# Patient Record
Sex: Female | Born: 1950 | Race: Black or African American | Hispanic: No | Marital: Married | Smoking: Never smoker
Health system: Southern US, Community
[De-identification: ages and names within clinical notes are randomized; demographics above are authoritative.]

## PROBLEM LIST (undated history)

## (undated) ENCOUNTER — Ambulatory Visit: Admission: EM | Payer: Medicare HMO

## (undated) DIAGNOSIS — I10 Essential (primary) hypertension: Secondary | ICD-10-CM

## (undated) DIAGNOSIS — E119 Type 2 diabetes mellitus without complications: Secondary | ICD-10-CM

## (undated) DIAGNOSIS — E7800 Pure hypercholesterolemia, unspecified: Secondary | ICD-10-CM

## (undated) DIAGNOSIS — D709 Neutropenia, unspecified: Secondary | ICD-10-CM

## (undated) HISTORY — DX: Essential (primary) hypertension: I10

## (undated) HISTORY — DX: Neutropenia, unspecified: D70.9

## (undated) HISTORY — DX: Type 2 diabetes mellitus without complications: E11.9

## (undated) HISTORY — PX: REDUCTION MAMMAPLASTY: SUR839

## (undated) HISTORY — PX: OTHER SURGICAL HISTORY: SHX169

## (undated) HISTORY — DX: Pure hypercholesterolemia, unspecified: E78.00

## (undated) NOTE — *Deleted (*Deleted)
[facility]  [address], [address] [place], [place] [address] Phone: [phone]  Fax: [phone]   Clinic Day:  11/23/2019  Referring physician: Rayetta Humphrey, MD  Chief Complaint: Darlene Chen is a 89 y.o. female with constitutional leukopenia and pancreatic cystic lesions who is seen for 3 week assessment and review of interval abdominal MRI.  HPI: The patient was last seen in the hematology clinic on 11/06/2019.  At that time, she was doing well.  She denied any B symptoms.  She was having issues with her knees and was planning bilateral knee replacement.  Exam revealed no adenopathy or hepatosplenomegaly. Hematocrit was 39.5, hemoglobin 12.9, platelets 311,000, WBC 2,700 (ANC 1,300). CA19-9 was 68 (0-35). Folate was 8.5. Vitamin B12 was 2,538.  We discussed a [facility] Hematology second opinion (Dr. Porfirio Oar).  Peripheral smear revealed absolute leukopenia, with ANC of 1300. Mature neutrophils showed appropriate nuclear lobation and cytoplasmic granularity. There was no increase in circulating blasts. RBC parameters and morphology were normal. Platelet count and morphology were normal.  Abdominal MRI with an without contrast on 11/17/2019 revealed a stable septated 1.7 cm cystic lesion in the pancreatic body communicating with the nondilated main pancreatic duct, most c/w a side-branch IPMN. There were no high risk MRI features. Annual follow-up MRI abdomen without and with IV contrast was recommended until 5 years stability demonstrated. There was no acute abnormality. There was a tiny anterior gallbladder wall polyp, not clearly seen on prior MRI, requiring no follow-up. There was no cholelithiasis. There was diffuse colonic diverticulosis.  During the interim, ***   Past Medical History:  Diagnosis Date  . Diabetes mellitus without complication (HCC)   . High cholesterol   . Hypertension   . Neutropenia [facility])     Past Surgical History:   Procedure Laterality Date  . arthroscopic knee surgery  on left 2000    . BREAST BIOPSY Right 2019   bx/clip-neg  . LEFT HEART CATH AND CORONARY ANGIOGRAPHY N/A 02/09/2017   Procedure: LEFT HEART CATH AND CORONARY ANGIOGRAPHY;  Surgeon: Alwyn Pea, MD;  Location: [facility] INVASIVE CV LAB;  Service: Cardiovascular;  Laterality: N/A;  . REDUCTION MAMMAPLASTY Bilateral    1990  . TUBAL LIGATION  1985  . UMBILICAL HERNIA REPAIR  2007    Family History  Problem Relation Age of Onset  . Breast cancer Sister 67    Social History:  reports that she has never smoked. She has never used smokeless tobacco. She reports that she does not drink alcohol and does not use drugs. does not drink alcohol or use drugs.She denies any alcohol or tobacco. She denies any know exposure to radiation or toxins. She is not currently working.  She lives in [place]. The patient is alone ***today.   Allergies:  Allergies  Allergen Reactions  . Colesevelam Nausea Only  . Biaxin [Clarithromycin] Hives  . Niacin Other (See Comments)    Burning from inside   . Statins Other (See Comments)    Muscle/joint pain.  . Sulfa Antibiotics Hives  . Sulfur Dioxide   . Tramadol Other (See Comments)    Unsure of exact reaction type  . Ezetimibe Other (See Comments)    Muscle/joint pain    Current Medications: Current Outpatient Medications  Medication Sig Dispense Refill  . amLODipine (NORVASC) 10 MG tablet Take 10 mg by mouth daily.    Marland Kitchen aspirin EC 81 MG tablet Take 81 mg by mouth daily.    . cycloSPORINE (  RESTASIS) 0.05 % ophthalmic emulsion Place 1 drop into both eyes 2 (two) times daily as needed.     . diclofenac sodium (VOLTAREN) 1 % GEL Apply 1 application topically as needed.    . fluocinonide cream (LIDEX) 0.05 % Apply 1 application topically 2 (two) times daily as needed. For eczema  0  . hydrochlorothiazide (HYDRODIURIL) 12.5 MG tablet Take 12.5 mg by mouth daily.    Marland Kitchen LORazepam (ATIVAN) 0.5 MG tablet  Take 1 tab 15-20 minutes prior to procedure. (Patient not taking: Reported on 11/06/2019)    . Melatonin (MELATONIN MAXIMUM STRENGTH) 5 MG TABS Take 2 tab qhs    . metFORMIN (GLUCOPHAGE) 500 MG tablet Take 500 mg by mouth 2 (two) times daily.    . metoprolol succinate (TOPROL-XL) 25 MG 24 hr tablet Take 25 mg by mouth daily.     No current facility-administered medications for this visit.    Review of Systems  Constitutional: Positive for diaphoresis (occasional). Negative for chills, fever, malaise/fatigue and weight loss (up 8 lbs).       Feels well.  HENT: Negative.  Negative for congestion, ear discharge, ear pain, hearing loss, nosebleeds, sinus pain, sore throat and tinnitus.   Eyes: Negative.  Negative for blurred vision, double vision and photophobia.  Respiratory: Negative.  Negative for cough, hemoptysis, sputum production and shortness of breath.   Cardiovascular: Negative.  Negative for chest pain, palpitations and leg swelling.  Gastrointestinal: Negative for abdominal pain, blood in stool, constipation, diarrhea, heartburn, melena, nausea and vomiting.       Normal bowels.  Genitourinary: Negative.  Negative for dysuria, flank pain, frequency, hematuria and urgency.  Musculoskeletal: Negative for back pain, falls, joint pain, myalgias and neck pain.  Skin: Negative.  Negative for itching and rash.  Neurological: Positive for headaches (rare). Negative for dizziness, tingling, speech change, focal weakness, seizures and weakness.  Endo/Heme/Allergies: Negative.  Negative for environmental allergies. Does not bruise/bleed easily.  Psychiatric/Behavioral: Negative.  Negative for depression and memory loss. The patient is not nervous/anxious and does not have insomnia.   All other systems reviewed and are negative.  Performance status (ECOG):  0***  Vitals There were no vitals taken for this visit.   Physical Exam Vitals and nursing note reviewed.  Constitutional:       General: She is not in acute distress.    Appearance: She is well-developed. She is not diaphoretic.  HENT:     Head: Normocephalic and atraumatic.     Comments: Short styled hair.    Mouth/Throat:     Pharynx: No oropharyngeal exudate.  Eyes:     General: No scleral icterus.    Conjunctiva/sclera: Conjunctivae normal.     Pupils: Pupils are equal, round, and reactive to light.     Comments: Brown eyes.  Neck:     Vascular: No JVD.  Cardiovascular:     Rate and Rhythm: Normal rate and regular rhythm.     Heart sounds: Normal heart sounds. No murmur heard.  No friction rub. No gallop.   Pulmonary:     Effort: Pulmonary effort is normal. No respiratory distress.     Breath sounds: Normal breath sounds. No wheezing or rales.  Chest:     Chest wall: No tenderness.  Abdominal:     General: Bowel sounds are normal. There is no distension.     Palpations: Abdomen is soft. There is no mass.     Tenderness: There is no abdominal tenderness. There is no  guarding or rebound.  Musculoskeletal:        General: No tenderness. Normal range of motion.     Cervical back: Normal range of motion and neck supple.  Lymphadenopathy:     Head:     Right side of head: No preauricular, posterior auricular or occipital adenopathy.     Left side of head: No preauricular, posterior auricular or occipital adenopathy.     Cervical: No cervical adenopathy.     Upper Body:     Right upper body: No supraclavicular adenopathy.     Left upper body: No supraclavicular adenopathy.  Skin:    General: Skin is warm and dry.     Coloration: Skin is not pale.     Findings: No erythema or rash.  Neurological:     Mental Status: She is alert and oriented to person, place, and time.  Psychiatric:        Behavior: Behavior normal.        Thought Content: Thought content normal.        Judgment: Judgment normal.    No visits with results within 3 Day(s) from this visit.  Latest known visit with results is:   Appointment on 11/06/2019  Component Date Value Ref Range Status  . Path Review 11/06/2019 Blood smear is reviewed.   Corrected   Comment: Absolute leukopenia, with ANC of 1300 per microliter. Mature neutrophils show appropriate nuclear lobation and cytoplasmic granularity. No increase in circulating blasts. Normal RBC parameters and morphology. Normal platelet count and morphology. The cause for the patient's leukopenia is unclear from morphologic review. Potential secondary causes of cytopenias should be considered, including nutritional deficiencies, medication/toxin effect, viral illness, or autoimmune/rheumatologic disease. If there is concern for an underlying marrow disorder, bone marrow biopsy and/or flow cytometry may provide a more comprehensive assessment. Clinical correlation is recommended. Reviewed by Beryle Quant, M.D. Performed at [facility], [address] [place], [place] [address] CORRECTED ON 11/01 AT 1517: PREVIOUSLY REPORTED AS Cytospin of peritoneal fluid is reviewed. No malignancy identified. Reactive mesothelial cells and chronic inflammatory cells. Less than 2 neutrophil                          s per microliter. Reviewed by Beryle Quant, M.D.   . WBC 11/06/2019 2.7* 4.0 - 10.5 K/uL Final  . RBC 11/06/2019 4.14  3.87 - 5.11 MIL/uL Final  . Hemoglobin 11/06/2019 12.9  12.0 - 15.0 g/dL Final  . HCT 19/14/7829 39.5  36 - 46 % Final  . MCV 11/06/2019 95.4  80.0 - 100.0 fL Final  . MCH 11/06/2019 31.2  26.0 - 34.0 pg Final  . MCHC 11/06/2019 32.7  30.0 - 36.0 g/dL Final  . RDW 56/21/3086 14.4  11.5 - 15.5 % Final  . Platelets 11/06/2019 311  150 - 400 K/uL Final  . nRBC 11/06/2019 0.0  0.0 - 0.2 % Final  . Neutrophils Relative % 11/06/2019 49  % Final  . Neutro Abs 11/06/2019 1.3* 1.7 - 7.7 K/uL Final  . Lymphocytes Relative 11/06/2019 42  % Final  . Lymphs Abs 11/06/2019 1.2  0.7 - 4.0 K/uL Final  . Monocytes Relative 11/06/2019 8  % Final  .  Monocytes Absolute 11/06/2019 0.2  0.1 - 1.0 K/uL Final  . Eosinophils Relative 11/06/2019 1  % Final  . Eosinophils Absolute 11/06/2019 0.0  0.0 - 0.5 K/uL Final  . Basophils Relative 11/06/2019 0  %  Final  . Basophils Absolute 11/06/2019 0.0  0.0 - 0.1 K/uL Final  . Immature Granulocytes 11/06/2019 0  % Final  . Abs Immature Granulocytes 11/06/2019 0.00  0.00 - 0.07 K/uL Final   Performed at [facility], [address], [place], [place] [address]    Assessment:  Loeta Herst is a 62 y.o. female with benign ethnic leukopenia/constitutional neutropenia. WBC has ranged between 2800 - 4400 since at least 03/12/2014 (no prior CBCs available).  Work-up on 06/28/2017 revealed a WBC 4,200, ANC 2,300, hemoglobin 13.7, hematocrit 40.9, platelets 382,000. B12 was 257 (low).  Normal studies included:  folate (12.8), HIV testing, hepatitis C antibody, hepatitis B core antibody total.  Peripheral smear was unremarkable.  Normal labs on 11/10/2018: folate (10.2), copper (122), and ANA.  She also has a history of pancreatic lesion initially seen on a abdomen CT in 05/2016.  MRCP MRI on 11/30/2017 revealed a septated cyst on the body of the pancreas, 1.7cm in maximum dimension. Additionally, there were liver and right kidney cysts, with mild hepatic steatosis. Abdominal MRI on 07/04/2018 revealed stable small cystic lesions in the pancreas.  MRCP MRI on 11/14/2018 revealed stable small pancreatic cystic lesions, largest in the pancreatic body measuring 1.7 cm.  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 Recommendation included continued follow-up by MRI in 6 months.  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  Continued annual follow-up was suggested.    Abdominal MRI with an without contrast on 11/17/2019 revealed a  stable septated 1.7 cm cystic lesion in the pancreatic body communicating with the nondilated main pancreatic duct, most c/w a side-branch IPMN. There were no high risk MRI features. Annual follow-up MRI abdomen without and with IV contrast was recommended until 5 years stability demonstrated. There was no acute abnormality. There was a tiny anterior gallbladder wall polyp, not clearly seen on prior MRI, requiring no follow-up. There was no cholelithiasis. There was diffuse colonic diverticulosis.  CA 19-9 has been followed: 52 on 05/16/2019, 47 on 07/05/2019, and 68 on 11/06/2019.  She has B12 deficiency.  B12 was 257 on 06/28/2017, 549 on 11/10/2018, 1763 on 07/05/2019, and 2538 on 11/06/2019.  She is on oral B12.  She has been off her oral B12.  Folate was 8.5 on 11/06/2019.  Symptomatically, ***  Plan: 1.   Review labs   2.   Mild leukopenia             CBCs prior to 2016 are unavailable.  WBC has ranged between 2800 - 4400 since at least 03/12/2014   WBC 2700 with an ANC of 1300 today.  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                         No increased risk of infections.                         Set point for circulating neutrophils lower.  Associated with Duffy null RBC type.                         No  treatment required.             Normal labs included: folate, copper and ANA.              Peripheral smear in 2019 revealed no abnormality.             No medications have been implicated.             She has had no infections, gingivitis, or B symptoms.  Exam reveals no adenopathy or hepatosplenomegaly. 3.   B12 deficiency             B12 was 257 on 06/28/2017 and 549 on 11/10/2018.   Recently B12 was 1763 on 07/05/2019.    B12 goal is 400.             Recheck B12 and adjust supplementation.  Folate is 8.5 today. 4.   Pancreatic lesion             Abdomen MRI on 05/22/2019 revealed  scattered pancreatic cysts.    There was a dominant 14 mm cystic lesion in the pancreatic tail (pseudocyst or side branch IPMN).   Continued annual follow-up was suggested.  Patient notes abdominal pain 2 weeks ago.  Patient requests f/u imaging.  Review prior recommendations.  Schedule repeat imaging prior to 12/2019. 5.   Planned bilateral knee surgery  She is scheduled for total knee replacement on 12/19/2019 in [place].  She notes no prior surgery with issues of infection (or low counts).  Discuss additional testing (marrow/flow cytometry) or second opinion prior to surgery.  Patient agrees to second opinion at [facility]. 6.   Abdomen MRI f/u pancreatic cyst. 7.   [facility]ematology second opinion (Dr. Porfirio Oar). 8.   RTC after abdomen MRI for MD assessment and review of imaging studies.  I discussed the assessment and treatment plan with the patient.  The patient was provided an opportunity to ask questions and all were answered.  The patient agreed with the plan and demonstrated an understanding of the instructions.  The patient was advised to call back if the symptoms worsen or if the condition fails to improve as anticipated.  I provided *** minutes of face-to-face time during this this encounter and > 50% was spent counseling as documented under my assessment and plan.   Rosey Bath, MD, PhD    11/23/2019, 4:29 PM  I, Jerilee Field, am acting as a Neurosurgeon for Rosey Bath, MD.  I, Areatha Kalata C. Merlene Pulling, MD, have reviewed the above documentation for accuracy and completeness, and I agree with the above.

---

## 1983-01-06 HISTORY — PX: TUBAL LIGATION: SHX77

## 2005-01-05 HISTORY — PX: UMBILICAL HERNIA REPAIR: SHX196

## 2008-09-02 ENCOUNTER — Emergency Department (HOSPITAL_COMMUNITY): Admission: EM | Admit: 2008-09-02 | Discharge: 2008-09-02 | Payer: Self-pay | Admitting: Emergency Medicine

## 2013-03-01 ENCOUNTER — Ambulatory Visit: Payer: Self-pay | Admitting: Physician Assistant

## 2013-06-02 DIAGNOSIS — M1712 Unilateral primary osteoarthritis, left knee: Secondary | ICD-10-CM | POA: Insufficient documentation

## 2013-09-26 DIAGNOSIS — E7800 Pure hypercholesterolemia, unspecified: Secondary | ICD-10-CM | POA: Insufficient documentation

## 2013-10-20 ENCOUNTER — Ambulatory Visit: Payer: Self-pay | Admitting: Family Medicine

## 2014-02-06 ENCOUNTER — Ambulatory Visit: Payer: Self-pay | Admitting: Family Medicine

## 2014-02-15 DIAGNOSIS — R7303 Prediabetes: Secondary | ICD-10-CM | POA: Insufficient documentation

## 2014-03-06 ENCOUNTER — Ambulatory Visit
Admit: 2014-03-06 | Disposition: A | Discharge status: Home / Self Care | Payer: Self-pay | Attending: Family Medicine | Admitting: Family Medicine

## 2014-04-06 ENCOUNTER — Ambulatory Visit
Admit: 2014-04-06 | Disposition: A | Discharge status: Home / Self Care | Payer: Self-pay | Attending: Family Medicine | Admitting: Family Medicine

## 2014-04-11 DIAGNOSIS — M940 Chondrocostal junction syndrome [Tietze]: Secondary | ICD-10-CM | POA: Insufficient documentation

## 2014-04-11 DIAGNOSIS — M8580 Other specified disorders of bone density and structure, unspecified site: Secondary | ICD-10-CM | POA: Insufficient documentation

## 2014-08-16 ENCOUNTER — Other Ambulatory Visit: Payer: Self-pay | Admitting: Family Medicine

## 2014-08-16 DIAGNOSIS — R928 Other abnormal and inconclusive findings on diagnostic imaging of breast: Secondary | ICD-10-CM

## 2014-08-24 ENCOUNTER — Ambulatory Visit
Admission: RE | Admit: 2014-08-24 | Discharge: 2014-08-24 | Disposition: A | Discharge status: Home / Self Care | Payer: Medicare Other | Source: Ambulatory Visit | Attending: Family Medicine | Admitting: Family Medicine

## 2014-08-24 ENCOUNTER — Other Ambulatory Visit: Payer: Self-pay | Admitting: Family Medicine

## 2014-08-24 DIAGNOSIS — R928 Other abnormal and inconclusive findings on diagnostic imaging of breast: Secondary | ICD-10-CM

## 2014-08-24 DIAGNOSIS — N6001 Solitary cyst of right breast: Secondary | ICD-10-CM | POA: Diagnosis not present

## 2014-11-15 DIAGNOSIS — M791 Myalgia, unspecified site: Secondary | ICD-10-CM | POA: Insufficient documentation

## 2014-11-23 ENCOUNTER — Encounter: Payer: Self-pay | Admitting: *Deleted

## 2015-04-08 DIAGNOSIS — Z6841 Body Mass Index (BMI) 40.0 and over, adult: Secondary | ICD-10-CM | POA: Insufficient documentation

## 2015-06-20 ENCOUNTER — Other Ambulatory Visit: Payer: Self-pay | Admitting: Student

## 2015-06-20 DIAGNOSIS — M7581 Other shoulder lesions, right shoulder: Secondary | ICD-10-CM

## 2015-06-27 ENCOUNTER — Ambulatory Visit (HOSPITAL_COMMUNITY)
Admission: RE | Admit: 2015-06-27 | Discharge: 2015-06-27 | Disposition: A | Discharge status: Home / Self Care | Payer: Medicare Other | Source: Ambulatory Visit | Attending: Student | Admitting: Student

## 2015-06-27 DIAGNOSIS — X58XXXA Exposure to other specified factors, initial encounter: Secondary | ICD-10-CM | POA: Insufficient documentation

## 2015-06-27 DIAGNOSIS — S43491A Other sprain of right shoulder joint, initial encounter: Secondary | ICD-10-CM | POA: Insufficient documentation

## 2015-06-27 DIAGNOSIS — M7581 Other shoulder lesions, right shoulder: Secondary | ICD-10-CM | POA: Insufficient documentation

## 2015-06-27 DIAGNOSIS — M7551 Bursitis of right shoulder: Secondary | ICD-10-CM | POA: Diagnosis not present

## 2015-06-27 DIAGNOSIS — M19011 Primary osteoarthritis, right shoulder: Secondary | ICD-10-CM | POA: Insufficient documentation

## 2015-07-16 ENCOUNTER — Other Ambulatory Visit: Payer: Self-pay | Admitting: Family Medicine

## 2015-07-16 DIAGNOSIS — Z1231 Encounter for screening mammogram for malignant neoplasm of breast: Secondary | ICD-10-CM

## 2015-07-24 ENCOUNTER — Encounter: Payer: Self-pay | Admitting: Physical Therapy

## 2015-07-24 ENCOUNTER — Ambulatory Visit: Payer: Medicare Other | Attending: Student | Admitting: Physical Therapy

## 2015-07-24 ENCOUNTER — Ambulatory Visit
Admission: RE | Admit: 2015-07-24 | Discharge: 2015-07-24 | Disposition: A | Discharge status: Home / Self Care | Payer: Medicare Other | Source: Ambulatory Visit | Attending: Family Medicine | Admitting: Family Medicine

## 2015-07-24 DIAGNOSIS — M25511 Pain in right shoulder: Secondary | ICD-10-CM | POA: Diagnosis present

## 2015-07-24 DIAGNOSIS — M25611 Stiffness of right shoulder, not elsewhere classified: Secondary | ICD-10-CM | POA: Insufficient documentation

## 2015-07-24 DIAGNOSIS — Z1231 Encounter for screening mammogram for malignant neoplasm of breast: Secondary | ICD-10-CM | POA: Diagnosis present

## 2015-07-24 DIAGNOSIS — M6281 Muscle weakness (generalized): Secondary | ICD-10-CM | POA: Insufficient documentation

## 2015-07-24 NOTE — Therapy (Signed)
[facility] [address] [place], [place], [address] Phone: [phone]   Fax:  [phone]  Physical Therapy Evaluation  Patient Details  Name: Aracelis Handelsman MRN: [id number] Date of Birth: [birth year] Referring Provider: Lattie Corns  Encounter Date: 07/24/2015      PT End of Session - 07/24/15 1205    Visit Number 1   Number of Visits 8   Date for PT Re-Evaluation 08/22/15   Authorization - Visit Number 1   Authorization - Number of Visits 10   PT Start Time [id number]   PT Stop Time 1110   PT Time Calculation (min) 78 min   Activity Tolerance Patient tolerated treatment well;Patient limited by pain   Behavior During Therapy [facility] for tasks assessed/performed      History reviewed. No pertinent past medical history.  Past Surgical History  Procedure Laterality Date  . Reduction mammaplasty Bilateral     1990    There were no vitals filed for this visit.       Subjective Assessment - 07/24/15 1150    Subjective Pt reports chronic hx of R shoulder pain dating back to November 2016. She states that between that date and now she has had 2-3 excerbations of shoulder pain. She was treated with an injection in the shoulder which resolved sx initially. 6 months later her R shoulder pain flared up again and she went for another injection which did not help and she experienced 3-4 days of severe R shoulder pain following injection. She states that when shoulder pain is active, any motion with her UE is bothersome and she experiences both limitations related to pain and mobility. Denies N/T or hx of cervical pain.   Pertinent History Initial onset of shoulder pain November 2016. Pt got relief with injection. 6 months later pain resurfaced, pt recieved a second injection with no benefit. 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   Limitations House hold activities;Lifting   Patient Stated Goals pt  would like to be pain free with R shoulder motion   Currently in Pain? No/denies            [facility] PT Assessment - 07/24/15 0001    Assessment   Medical Diagnosis Primary Osteoarthritis of right shoulder; right rotator cuff tendonitis; subacromial bursitis of right shoulder joint   Referring Provider Lattie Corns   Onset Date/Surgical Date 11/06/14  Acute excerbation June 2017   Hand Dominance Right       Objective:  Manual tx: Grade II-III AP mobilization of R shoulder due to mild hypomobility (2 bouts, 1 minute ea), Grade II-III inferior glide of R shoulder (2 bouts, 1 minute). Pt with general hypomobility of shoulder capsule with no increased pain with mobilization.  There ex: Sidelying internal rotation stretch of R shoulder 2x30 sec to tolerance. Sidelying external rotation AROM R shoulder with towel roll 2x10 with cueing to squeeze towel lightly before performing reps. Internal rotation stretch to tolerance with wand 5x10 sec. Standing ER/IR with yellow theraband 2x10 R shoulder with cueing to keep elbow close to her side and limit tendency for trunk rotation compensation. Pt instructed to perform HEP exercises to tolerance and not to increase pain in R shoulder above 3/10.  Pt response for medical necessity: Pt demonstrates mild to moderate hypomobility of R shoulder secondary to pain with most notable deficits in IR/ER. She presents with diminished strength overall in RUE and will benefit from skilled PT services to  address mobility and strength deficits with pain management as needed.       PT Education - 07/24/15 1205    Education provided Yes   Education Details see HEP   Person(s) Educated Patient   Methods Explanation;Demonstration;Verbal cues;Handout   Comprehension Verbalized understanding;Returned demonstration             PT Long Term Goals - 07/24/15 1236    PT LONG TERM GOAL #1   Title Pt will score <25% on QuickDash to promote percieved functional  mobility/ability   Baseline 7/19: 34%   Time 4   Period Weeks   Status New   PT LONG TERM GOAL #2   Title Pt will achieve within 10 deg of AROM on R shoulder as compared to L shoulder to promote longterm mobility   Baseline 7/19: R/L flexion 149/155. abduction 153/155. IR 55/57. ER. 57/83   Time 4   Period Weeks   Status New   PT LONG TERM GOAL #3   Title Pt will improve MMT strength testing by 1/2 grade to promote return of functional strength   Baseline 7/19 R/L flexion 4-/4, abd 3+/4+, elbow flex 4-/4+, elbow extension 4-/4+, IR/ER unable to test due to increase in pain   Time 4   Period Weeks   Status New   PT LONG TERM GOAL #4   Title Pt will report compliance with HEP to promote independent management of shoulder pain   Baseline no current HEP   Time 4   Period Weeks   Status New   PT LONG TERM GOAL #5   Title Pt will report <2/10 pain with reaching/lifting tasks to promote functional ability so that she can participate in household tasks   Baseline reporting instances of 10/10 pain with acute exacerbation of sx   Time 4   Period Weeks   Status New             Plan - 07/24/15 1207    Clinical Impression Statement Pt is a pleasant 65 y/o referred to PT for chronic hx of R shoulder pain begining in November of 2016. She experiences the majority of her pain along the anterior/posterior joint line with moderate tenderness to palpation along anterior/middle deltoid, supraspinatus and teres major. At its best pain is 0/10 with pt experiencing acute instances of 10/10. At beginning of PT session pt reports 0/10 pain. She states that there is no position of comfort that relieves sx when shoulder pain is active but she reports mild relief with pain medication during previous excerbation of sx. Strength: R/L flexion 4-/4, abduction 3+/4+ pain limited, elbow flexion 4+/5, elbow extension 4+/5, ER/IR unable to assess due to increased pain with AROM. AROM:  R/L flexion 149 deg/155 deg.  abduction: 153 deg/155 deg, IR 55 deg/101 deg, ER 57 deg, 83 deg pt limited by pain for all AROM/PROM. HBB: R/L T10, difficulty achieving beltline limited by pain. Special Tests: R empty can +, hawkins kennedy +, scapular assist -  Outcome Measures: Quick Dash 34%.  Pt. will benefit from skilled PT services to increase R shoulder pain-free ROM/ strengthening to improve functional mobility.     Rehab Potential Fair   Clinical Impairments Affecting Rehab Potential Negative: chronic nature of pain, comorbidities. Positive: motivated, family support   PT Frequency 2x / week   PT Duration 4 weeks   PT Treatment/Interventions ADLs/Self Care Home Management;Biofeedback;Cryotherapy;Electrical Stimulation;Iontophoresis 4mg /ml Dexamethasone;Moist Heat;Ultrasound;Functional mobility training;Therapeutic activities;Therapeutic exercise;Patient/family education;Manual techniques;Passive range of motion;Dry needling   PT Next  Visit Plan scapular stabilizers, ER/IR and middle/low trap strength testing bilaterally, strength training, mobilizations as needed for pain relief   PT Home Exercise Plan see HEP   Consulted and Agree with Plan of Care Patient      Patient will benefit from skilled therapeutic intervention in order to improve the following deficits and impairments:  Decreased activity tolerance, Decreased endurance, Decreased mobility, Decreased range of motion, Decreased strength, Hypomobility, Increased edema, Increased muscle spasms, Impaired flexibility, Impaired tone, Impaired UE functional use, Improper body mechanics, Postural dysfunction  Visit Diagnosis: Muscle weakness (generalized)  Pain in right shoulder  Stiffness of right shoulder, not elsewhere classified      G-Codes - 08/21/15 1315    Functional Assessment Tool Used clinical impression/ QuickDASH/ pain/ ROM/ muscle weakness   Functional Limitation Carrying, moving and handling objects   Carrying, Moving and Handling Objects Current  Status [id number]) At least 20 percent but less than 40 percent impaired, limited or restricted   Carrying, Moving and Handling Objects Goal Status [id number]) At least 1 percent but less than 20 percent impaired, limited or restricted       Problem List There are no active problems to display for this patient.  Pura Spice, PT, DPT # [id number] Derrill Memo, SPT  08/21/2015, 1:16 PM  [facility] [address] [place], [place], [address] Phone: [phone]   Fax:  [phone]  Name: Camani Taubman MRN: [id number] Date of Birth: May 13, 1950

## 2015-07-29 ENCOUNTER — Encounter: Payer: Medicare Other | Admitting: Physical Therapy

## 2015-07-31 ENCOUNTER — Encounter: Payer: Medicare Other | Admitting: Physical Therapy

## 2015-08-05 ENCOUNTER — Encounter: Payer: Self-pay | Admitting: Physical Therapy

## 2015-08-05 ENCOUNTER — Ambulatory Visit: Payer: Medicare Other | Admitting: Physical Therapy

## 2015-08-05 DIAGNOSIS — M6281 Muscle weakness (generalized): Secondary | ICD-10-CM | POA: Diagnosis not present

## 2015-08-05 DIAGNOSIS — M25511 Pain in right shoulder: Secondary | ICD-10-CM

## 2015-08-05 DIAGNOSIS — M25611 Stiffness of right shoulder, not elsewhere classified: Secondary | ICD-10-CM

## 2015-08-05 NOTE — Therapy (Signed)
[facility] [address] [place], [place], [address] Phone: [phone]   Fax:  [phone]  Physical Therapy Treatment  Patient Details  Name: Darlene Chen MRN: [id number] Date of Birth: 16-Dec-1950 Referring Provider: Lattie Corns  Encounter Date: 08/05/2015      PT End of Session - 08/05/15 0737    Visit Number 2   Number of Visits 8   Date for PT Re-Evaluation 08/22/15   Authorization - Visit Number 2   Authorization - Number of Visits 10   PT Start Time 0730   PT Stop Time [id number]   PT Time Calculation (min) 47 min   Activity Tolerance Patient tolerated treatment well;Patient limited by pain   Behavior During Therapy [facility] for tasks assessed/performed      History reviewed. No pertinent past medical history.  Past Surgical History:  Procedure Laterality Date  . REDUCTION MAMMAPLASTY Bilateral    1990    There were no vitals filed for this visit.      Subjective Assessment - 08/05/15 0736    Subjective Pt reports she has been sick with bronchitis for the last week. She has not been compliant with HEP due to fatigue/sickness but states she has been resting her shoulder and overall its been feeling better.   Pertinent History Initial onset of shoulder pain November 2016. Pt got relief with injection. 6 months later pain resurfaced, pt recieved a second injection with no benefit. 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   Limitations House hold activities;Lifting   Patient Stated Goals pt would like to be pain free with R shoulder motion   Currently in Pain? No/denies      Objective:   Therapeutic Exercise: UBE 2 frwds/backwards (warm up). Shoulder ER with towel roll 2x15 yellow theraband with c/o mild pain in anterior shoulder jt line. Shoulder IR with yellow theraband 2x15. Standing Rows red theraband 2x15. Standing shoulder ext 2x15 yellow theraband. Triceps ext 2x10 yellow  theraband. Bicep curls 2# 2x10 BUE. Supine: serratus punches 2# 2x10. Verbal cueing for slow/controlled movements and "shoulder blade squeeze" before performing there ex.  Manual tx: AP mobilization R shoulder in supine Grade II-III per tolerance (3 bouts, 1 minute) approx 70/80/90 deg abd. Pt with moderate hypomobility of R shoulder capsule with mild increase in soreness following mobilization.   Pt response for medical necessity: Pt demonstrates good tolerance to strengthening there ex with mild increase in pain with isolation of supraspinatus in standing shoulder ER with towel roll. Pt presents with tendency for kyphotic posture with frequent cueing for "shoulder blade squeezes." Pt will benefit from skilled PT services to progress strengthening program and emphasize neutral posture and proper form with exercise/functional tasks.        PT Long Term Goals - 07/24/15 1236      PT LONG TERM GOAL #1   Title Pt will score <25% on QuickDash to promote percieved functional mobility/ability   Baseline 7/19: 34%   Time 4   Period Weeks   Status New     PT LONG TERM GOAL #2   Title Pt will achieve within 10 deg of AROM on R shoulder as compared to L shoulder to promote longterm mobility   Baseline 7/19: R/L flexion 149/155. abduction 153/155. IR 55/57. ER. 57/83   Time 4   Period Weeks   Status New     PT LONG TERM GOAL #3   Title Pt will improve MMT strength testing  by 1/2 grade to promote return of functional strength   Baseline 7/19 R/L flexion 4-/4, abd 3+/4+, elbow flex 4-/4+, elbow extension 4-/4+, IR/ER unable to test due to increase in pain   Time 4   Period Weeks   Status New     PT LONG TERM GOAL #4   Title Pt will report compliance with HEP to promote independent management of shoulder pain   Baseline no current HEP   Time 4   Period Weeks   Status New     PT LONG TERM GOAL #5   Title Pt will report <2/10 pain with reaching/lifting tasks to promote functional ability so  that she can participate in household tasks   Baseline reporting instances of 10/10 pain with acute exacerbation of sx   Time 4   Period Weeks   Status New          Plan - 08/05/15 [id number]    Clinical Impression Statement Pt c/o mild pain in anterior shoulder jt line with resisted ER; she is able to tolerate all other ther ex with no increase in sx. She has tendency to perform ther ex with kyphotic posture with emphasis on speed rather than control. Presents with moderate hypomobility of R shoulder capsule with AP mobilization.    Rehab Potential Fair   Clinical Impairments Affecting Rehab Potential Negative: chronic nature of pain, comorbidities. Positive: motivated, family support   PT Frequency 2x / week   PT Duration 4 weeks   PT Treatment/Interventions ADLs/Self Care Home Management;Biofeedback;Cryotherapy;Electrical Stimulation;Iontophoresis 4mg /ml Dexamethasone;Moist Heat;Ultrasound;Functional mobility training;Therapeutic activities;Therapeutic exercise;Patient/family education;Manual techniques;Passive range of motion;Dry needling   PT Next Visit Plan scapular stabilizers, ER/IR and middle/low trap strength testing bilaterally, strength training, mobilizations as needed for pain relief   PT Home Exercise Plan see HEP   Consulted and Agree with Plan of Care Patient      Patient will benefit from skilled therapeutic intervention in order to improve the following deficits and impairments:  Decreased activity tolerance, Decreased endurance, Decreased mobility, Decreased range of motion, Decreased strength, Hypomobility, Increased edema, Increased muscle spasms, Impaired flexibility, Impaired tone, Impaired UE functional use, Improper body mechanics, Postural dysfunction  Visit Diagnosis: Muscle weakness (generalized)  Pain in right shoulder  Stiffness of right shoulder, not elsewhere classified     Problem List There are no active problems to display for this patient.  Pura Spice, PT, DPT # [id number] Derrill Memo, SPT 08/05/2015, 1:48 PM  [place] [facility] [address] [place], [place], [address] Phone: [phone]   Fax:  [phone]  Name: Darlene Chen MRN: [id number] Date of Birth: [birth year]

## 2015-08-07 ENCOUNTER — Ambulatory Visit: Payer: Medicare Other | Attending: Student | Admitting: Physical Therapy

## 2015-08-07 DIAGNOSIS — M25611 Stiffness of right shoulder, not elsewhere classified: Secondary | ICD-10-CM

## 2015-08-07 DIAGNOSIS — M25511 Pain in right shoulder: Secondary | ICD-10-CM | POA: Diagnosis present

## 2015-08-07 DIAGNOSIS — M6281 Muscle weakness (generalized): Secondary | ICD-10-CM | POA: Insufficient documentation

## 2015-08-08 ENCOUNTER — Encounter: Payer: Self-pay | Admitting: Physical Therapy

## 2015-08-08 NOTE — Therapy (Signed)
[facility] [address] [place], [place], [address] Phone: [phone]   Fax:  [phone]  Physical Therapy Treatment  Patient Details  Name: Darlene Chen MRN: [id number] Date of Birth: [birth year] Referring Provider: Lattie Corns  Encounter Date: 08/07/2015      PT End of Session - 08/08/15 1334    Visit Number 3   Number of Visits 8   Date for PT Re-Evaluation 08/22/15   Authorization - Visit Number 3   Authorization - Number of Visits 10   PT Start Time 0734   PT Stop Time 0829   PT Time Calculation (min) 55 min   Activity Tolerance Patient tolerated treatment well;Patient limited by pain   Behavior During Therapy [facility] for tasks assessed/performed      History reviewed. No pertinent past medical history.  Past Surgical History:  Procedure Laterality Date  . REDUCTION MAMMAPLASTY Bilateral    1990    There were no vitals filed for this visit.      Subjective Assessment - 08/08/15 1059    Subjective Pt. entered PT with c/o 2/10 R shoulder pain.  Pt. states she has been doing HEP.  Pt. states she is feeling weak.   Pertinent History Initial onset of shoulder pain November 2016. Pt got relief with injection. 6 months later pain resurfaced, pt recieved a second injection with no benefit. 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   Limitations House hold activities;Lifting   Patient Stated Goals pt would like to be pain free with R shoulder motion   Currently in Pain? No/denies      Objective:   Therapeutic Exercise: UBE 2 min. frwds/backwards (warm up/ charge).  YTB ex.: IR/ adduction/ extension/ flexion (horizontal emphasis)- 30x each.  Standing 3# bicep curls/ tricep ext.  Discussed HEP.  Manual tx: Supine R shoulder AP/PA mobilization Grade II-III per tolerance (3 bouts, 1 minute) approx 70/80/90 deg abd. Pt with moderate hypomobility of R shoulder capsule with mild  increase in soreness following mobilization.  R shoulder AAROM all planes in pain-tolerable range.    Pt response for medical necessity: Pt demonstrates good tolerance to strengthening there ex with mild increase in pain with isolation of supraspinatus in standing shoulder ER with towel roll. Pt presents with tendency for kyphotic posture with frequent cueing for "shoulder blade squeezes." Pt will benefit from skilled PT services to progress strengthening program and emphasize neutral posture and proper form with exercise/functional tasks.        PT Long Term Goals - 07/24/15 1236      PT LONG TERM GOAL #1   Title Pt will score <25% on QuickDash to promote percieved functional mobility/ability   Baseline 7/19: 34%   Time 4   Period Weeks   Status New     PT LONG TERM GOAL #2   Title Pt will achieve within 10 deg of AROM on R shoulder as compared to L shoulder to promote longterm mobility   Baseline 7/19: R/L flexion 149/155. abduction 153/155. IR 55/57. ER. 57/83   Time 4   Period Weeks   Status New     PT LONG TERM GOAL #3   Title Pt will improve MMT strength testing by 1/2 grade to promote return of functional strength   Baseline 7/19 R/L flexion 4-/4, abd 3+/4+, elbow flex 4-/4+, elbow extension 4-/4+, IR/ER unable to test due to increase in pain   Time 4   Period Weeks  Status New     PT LONG TERM GOAL #4   Title Pt will report compliance with HEP to promote independent management of shoulder pain   Baseline no current HEP   Time 4   Period Weeks   Status New     PT LONG TERM GOAL #5   Title Pt will report <2/10 pain with reaching/lifting tasks to promote functional ability so that she can participate in household tasks   Baseline reporting instances of 10/10 pain with acute exacerbation of sx   Time 4   Period Weeks   Status New            Plan - 08/08/15 1334    Clinical Impression Statement Pt. required minor modifications with resisted shoulder IR/  adduction ex. with theraband.  No increase c/o pain t/o tx. session.  Pt. progressing well with light resisted theraband/ resisted ex. program.  PT recommends addition of ice to shoulder after ther. ex. at home to prevent inflammation/ pain.      Rehab Potential Fair   Clinical Impairments Affecting Rehab Potential Negative: chronic nature of pain, comorbidities. Positive: motivated, family support   PT Frequency 2x / week   PT Duration 4 weeks   PT Treatment/Interventions ADLs/Self Care Home Management;Biofeedback;Cryotherapy;Electrical Stimulation;Iontophoresis 4mg /ml Dexamethasone;Moist Heat;Ultrasound;Functional mobility training;Therapeutic activities;Therapeutic exercise;Patient/family education;Manual techniques;Passive range of motion;Dry needling   PT Next Visit Plan scapular stabilizers, ER/IR and middle/low trap strength testing bilaterally, strength training, mobilizations as needed for pain relief   PT Home Exercise Plan see HEP   Consulted and Agree with Plan of Care Patient      Patient will benefit from skilled therapeutic intervention in order to improve the following deficits and impairments:  Decreased activity tolerance, Decreased endurance, Decreased mobility, Decreased range of motion, Decreased strength, Hypomobility, Increased edema, Increased muscle spasms, Impaired flexibility, Impaired tone, Impaired UE functional use, Improper body mechanics, Postural dysfunction  Visit Diagnosis: Muscle weakness (generalized)  Pain in right shoulder  Stiffness of right shoulder, not elsewhere classified     Problem List There are no active problems to display for this patient.  Pura Spice, PT, DPT # [phone]  08/08/2015, 4:50 PM  [facility] [address] [place], [place], [address] Phone: [phone]   Fax:  [phone]  Name: Derenda Morriss MRN: [id number] Date of Birth: 01/29/1950

## 2015-08-12 ENCOUNTER — Ambulatory Visit: Payer: Medicare Other | Admitting: Physical Therapy

## 2015-08-12 DIAGNOSIS — M6281 Muscle weakness (generalized): Secondary | ICD-10-CM

## 2015-08-12 DIAGNOSIS — M25511 Pain in right shoulder: Secondary | ICD-10-CM

## 2015-08-12 DIAGNOSIS — M25611 Stiffness of right shoulder, not elsewhere classified: Secondary | ICD-10-CM

## 2015-08-12 NOTE — Therapy (Signed)
[facility] [address]. [place], [place], [address] Phone: [phone]   Fax:  [phone]  Physical Therapy Treatment  Patient Details  Name: Darlene Chen MRN: [id number] Date of Birth: Jul 06, 1950 Referring Provider: Lattie Corns  Encounter Date: 08/12/2015      PT End of Session - 08/12/15 0735    Visit Number 4   Number of Visits 8   Date for PT Re-Evaluation 08/22/15   Authorization - Visit Number 4   Authorization - Number of Visits 10   PT Start Time 0732   PT Stop Time 0816   PT Time Calculation (min) 44 min   Activity Tolerance Patient tolerated treatment well;No increased pain   Behavior During Therapy [facility] for tasks assessed/performed      No past medical history on file.  Past Surgical History:  Procedure Laterality Date  . REDUCTION MAMMAPLASTY Bilateral    1990    There were no vitals filed for this visit.      Subjective Assessment - 08/12/15 0733    Subjective Pt. states she is doing well.  No c/o R shoulder pain at this time.  Pt. reports compliance with HEP.  Returns to MD for f/u visit this afternoon.     Pertinent History Initial onset of shoulder pain November 2016. Pt got relief with injection. 6 months later pain resurfaced, pt recieved a second injection with no benefit. 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   Limitations House hold activities;Lifting   Patient Stated Goals pt would like to be pain free with R shoulder motion   Currently in Pain? No/denies     Objective:   Therapeutic Exercise: UBE 2 min. frwds/backwards (warm up/ charge).  Seated Nautilus: 30# lat. Pull downs/ standing tricep ext./ shoulder adduction 10x2 each.  Scap. Retraction 30# 20x.  Seated 3# bicep curls/ press-ups.  Discussed HEP (pt. Will continue with YTB at this time with increase reps)- no RTB.  Supine R shoulder rhythmic stabs (3x30 sec.)- mod. Resistance all planes.   Supine serratus punches 10x2.  Seated B shoulder flexion/ abd.   Reassessed MMT.  Pt response for medical necessity: Pt demonstrates good tolerance to strengthening there ex with mild increase in pain discomfort, not pain with resisted flexion/ abd.  Pt will benefit from skilled PT services to progress strengthening program and emphasize neutral posture and proper form with exercise/functional tasks.       PT Long Term Goals - 08/12/15 0801      PT LONG TERM GOAL #1   Title Pt will score <25% on QuickDash to promote percieved functional mobility/ability   Baseline 8/7:  20.5% (marked improvement since initial evaluation).     Time 4   Period Weeks   Status Achieved     PT LONG TERM GOAL #2   Title Pt will achieve within 10 deg of AROM on R shoulder as compared to L shoulder to promote longterm mobility   Baseline 08/12/15 :  R/L flexion 156/155. abduction 155/155. IR 55/57. ER. 80/83   Time 4   Period Weeks   Status Achieved     PT LONG TERM GOAL #3   Title Pt will improve MMT strength testing by 1/2 grade to promote return of functional strength   Baseline R grip: 67# and L grip: 62#   Time 4   Period Weeks   Status Partially Met     PT LONG TERM GOAL #4   Title  Pt will report compliance with HEP to promote independent management of shoulder pain   Baseline 3x/week (progressing well).    Time 4   Period Weeks   Status Partially Met     PT LONG TERM GOAL #5   Title Pt will report <2/10 pain with reaching/lifting tasks to promote functional ability so that she can participate in household tasks   Baseline Occasional 2-3/10 R shoulder pain with certain tasks.    Time 4   Period Weeks   Status Partially Met           Plan - 08/12/15 0735    Clinical Impression Statement Pt. demonstrates full R shoulder AROM as compared to L shoulder (all planes) in sitting posture.  Pt. is progressing with strengthening/ light resisted ex. program in a pain tolerable.  Several PT goals  met at this time and pt. will benefit from continued PT to maximize pain-free strengthening/ postural ex. program.     Rehab Potential Fair   Clinical Impairments Affecting Rehab Potential Negative: chronic nature of pain, comorbidities. Positive: motivated, family support   PT Frequency 2x / week   PT Duration 4 weeks   PT Treatment/Interventions ADLs/Self Care Home Management;Biofeedback;Cryotherapy;Electrical Stimulation;Iontophoresis 35m/ml Dexamethasone;Moist Heat;Ultrasound;Functional mobility training;Therapeutic activities;Therapeutic exercise;Patient/family education;Manual techniques;Passive range of motion;Dry needling   PT Next Visit Plan Discuss MD f/u visit.  Progress HEP/ resisted ex. as tolerated.     PT Home Exercise Plan see HEP   Consulted and Agree with Plan of Care Patient      Patient will benefit from skilled therapeutic intervention in order to improve the following deficits and impairments:  Decreased activity tolerance, Decreased endurance, Decreased mobility, Decreased range of motion, Decreased strength, Hypomobility, Increased edema, Increased muscle spasms, Impaired flexibility, Impaired tone, Impaired UE functional use, Improper body mechanics, Postural dysfunction  Visit Diagnosis: Muscle weakness (generalized)  Pain in right shoulder  Stiffness of right shoulder, not elsewhere classified     Problem List There are no active problems to display for this patient.  MPura Spice PT, DPT # [phone] 08/12/2015, 9:17 AM  [place] [facility] [address]Phone: [phone]  Fax:  [phone] Name: RLindia GarmsMRN: [id number] of Birth: 105-11-1950

## 2015-08-14 ENCOUNTER — Ambulatory Visit: Payer: Medicare Other | Admitting: Physical Therapy

## 2015-08-14 DIAGNOSIS — M6281 Muscle weakness (generalized): Secondary | ICD-10-CM

## 2015-08-14 DIAGNOSIS — M25611 Stiffness of right shoulder, not elsewhere classified: Secondary | ICD-10-CM

## 2015-08-14 DIAGNOSIS — M25511 Pain in right shoulder: Secondary | ICD-10-CM

## 2015-08-14 NOTE — Therapy (Signed)
[facility] [address] [place], [place], [address] Phone: [phone]   Fax:  [phone]  Physical Therapy Treatment  Patient Details  Name: Darlene Chen MRN: [id number] Date of Birth: 1950/07/15 Referring Provider: Lattie Corns  Encounter Date: 08/14/2015      PT End of Session - 08/14/15 0738    Visit Number 5   Number of Visits 8   Date for PT Re-Evaluation 08/22/15   Authorization - Visit Number 5   Authorization - Number of Visits 10   PT Start Time 3154   PT Stop Time 0086   PT Time Calculation (min) 43 min   Activity Tolerance Patient tolerated treatment well;No increased pain   Behavior During Therapy [facility] for tasks assessed/performed      No past medical history on file.  Past Surgical History:  Procedure Laterality Date  . REDUCTION MAMMAPLASTY Bilateral    1990    There were no vitals filed for this visit.      Subjective Assessment - 08/14/15 0736    Subjective No c/o R shoulder pain this morning.  Pt. states she is staying active as needed with daily household tasks.  Pt. reports MD appt. went well (decrease freq. to 1x/week).     Pertinent History Initial onset of shoulder pain November 2016. Pt got relief with injection. 6 months later pain resurfaced, pt recieved a second injection with no benefit. 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   Limitations House hold activities;Lifting   Patient Stated Goals pt would like to be pain free with R shoulder motion   Currently in Pain? No/denies      Objective:   Therapeutic Exercise: UBE 2 min.frwds/backwards (warm up/ no charge).  Standing ball shoulder flexion (attempted bouncing but difficulty)/ serratus CW/CCW 10x2. Seated Nautilus: 30# lat. Pull downs/ standing tricep ext./ shoulder adduction 30x each.  Scap. Retraction 30# 30x.  Standing Nautilus 10# bicep curls 30x. Standing B shoulder AROM all  planes.  Supine R shoulder rhythmic stabs (3x30 sec.)- mod. Resistance all planes.  Supine serratus punches 10x2.  Supine pec stretches 4x each with holds as tolerated.  No tenderness with palpation to R shoulder.       Pt response for medical necessity: Pt demonstrates good tolerance to strengthening there ex with mild increase in pain discomfort, not pain with resisted flexion/ abd.  Pt will benefit from skilled PT services to progress strengthening program and emphasize neutral posture and proper form with exercise/functional tasks.        PT Long Term Goals - 08/12/15 0801      PT LONG TERM GOAL #1   Title Pt will score <25% on QuickDash to promote percieved functional mobility/ability   Baseline 8/7:  20.5% (marked improvement since initial evaluation).     Time 4   Period Weeks   Status Achieved     PT LONG TERM GOAL #2   Title Pt will achieve within 10 deg of AROM on R shoulder as compared to L shoulder to promote longterm mobility   Baseline 08/12/15 :  R/L flexion 156/155. abduction 155/155. IR 55/57. ER. 80/83   Time 4   Period Weeks   Status Achieved     PT LONG TERM GOAL #3   Title Pt will improve MMT strength testing by 1/2 grade to promote return of functional strength   Baseline R grip: 67# and L grip: 62#   Time 4   Period  Weeks   Status Partially Met     PT LONG TERM GOAL #4   Title Pt will report compliance with HEP to promote independent management of shoulder pain   Baseline 3x/week (progressing well).    Time 4   Period Weeks   Status Partially Met     PT LONG TERM GOAL #5   Title Pt will report <2/10 pain with reaching/lifting tasks to promote functional ability so that she can participate in household tasks   Baseline Occasional 2-3/10 R shoulder pain with certain tasks.    Time 4   Period Weeks   Status Partially Met            Plan - 08/14/15 0738    Clinical Impression Statement Pt. continues to progress with full B shoulder AROM and  progressive resistive ex. program.  Pt. will benefit from more of a HEP focus with a decrease frequency to 1x/week.  No R shoulder tenderness with palpation during manual tx. today.     Rehab Potential Fair   Clinical Impairments Affecting Rehab Potential Negative: chronic nature of pain, comorbidities. Positive: motivated, family support   PT Frequency 2x / week   PT Duration 4 weeks   PT Treatment/Interventions ADLs/Self Care Home Management;Biofeedback;Cryotherapy;Electrical Stimulation;Iontophoresis 54m/ml Dexamethasone;Moist Heat;Ultrasound;Functional mobility training;Therapeutic activities;Therapeutic exercise;Patient/family education;Manual techniques;Passive range of motion;Dry needling   PT Next Visit Plan Continue to progress shoulder strengthening as tolerated.     PT Home Exercise Plan see HEP   Consulted and Agree with Plan of Care Patient      Patient will benefit from skilled therapeutic intervention in order to improve the following deficits and impairments:  Decreased activity tolerance, Decreased endurance, Decreased mobility, Decreased range of motion, Decreased strength, Hypomobility, Increased edema, Increased muscle spasms, Impaired flexibility, Impaired tone, Impaired UE functional use, Improper body mechanics, Postural dysfunction  Visit Diagnosis: Muscle weakness (generalized)  Pain in right shoulder  Stiffness of right shoulder, not elsewhere classified     Problem List There are no active problems to display for this patient.  MPura Spice PT, DPT # [phone]/09/2015, 9:24 AM  [place] [facility] [address]Phone: [phone]  Fax:  [phone] Name: Darlene KallstromMRN: [id number] of Birth: [birth year]

## 2015-08-19 ENCOUNTER — Encounter: Payer: Medicare Other | Admitting: Physical Therapy

## 2015-08-21 ENCOUNTER — Ambulatory Visit: Payer: Medicare Other | Admitting: Physical Therapy

## 2015-08-21 DIAGNOSIS — M25511 Pain in right shoulder: Secondary | ICD-10-CM

## 2015-08-21 DIAGNOSIS — M6281 Muscle weakness (generalized): Secondary | ICD-10-CM

## 2015-08-21 DIAGNOSIS — M25611 Stiffness of right shoulder, not elsewhere classified: Secondary | ICD-10-CM

## 2015-08-21 NOTE — Therapy (Signed)
[facility] [address] [place], [place], [address] Phone: [phone]   Fax:  [phone]  Physical Therapy Treatment  Patient Details  Name: Darlene Chen MRN: [id number] Date of Birth: 15-Sep-1950 Referring Provider: Lattie Corns  Encounter Date: 08/21/2015    No past medical history on file.  Past Surgical History:  Procedure Laterality Date  . REDUCTION MAMMAPLASTY Bilateral    1990    There were no vitals filed for this visit.     Pt. reports no new complaints.  Pt. states she is sleeping well.  Pt. is doing HEP.     Objective:   Therapeutic Exercise: UBE 2 min.frwds/backwards (warm up/ no charge).  Standing ball shoulder flexion (walking L/R)/ serratus CW/CCW 10x2. Seated Nautilus: 30# lat. Pull downs/ standing tricep ext./ shoulder adduction 30x each. Scap. Retraction 30# 30x. Standing Nautilus10# bicep curls 30x.Standing B shoulder AROM all planes. Supine R shoulder rhythmic stabs (3x30 sec.)- mod. Resistance all planes. Supine serratus punches 2# 10x2. Supine pec stretches 3x each with holds as tolerated.      Pt response for medical necessity: Pt demonstrates good tolerance to strengthening there ex with mild increase in pain discomfort, not pain with resisted flexion/ abd.Pt will benefit from skilled PT services to progress strengthening program and emphasize neutral posture and proper form with exercise/functional tasks.    No pain issues reported since last tx. session.  Pt. continues to progress with shoulder stability ex. program in a full sh. capsule range.  Reassess goals next tx. to determine recert vs. discharge.        PT Long Term Goals - 08/12/15 0801      PT LONG TERM GOAL #1   Title Pt will score <25% on QuickDash to promote percieved functional mobility/ability   Baseline 8/7:  20.5% (marked improvement since initial evaluation).     Time 4   Period Weeks   Status Achieved     PT LONG TERM GOAL #2   Title Pt will achieve within 10 deg of AROM on R shoulder as compared to L shoulder to promote longterm mobility   Baseline 08/12/15 :  R/L flexion 156/155. abduction 155/155. IR 55/57. ER. 80/83   Time 4   Period Weeks   Status Achieved     PT LONG TERM GOAL #3   Title Pt will improve MMT strength testing by 1/2 grade to promote return of functional strength   Baseline R grip: 67# and L grip: 62#   Time 4   Period Weeks   Status Partially Met     PT LONG TERM GOAL #4   Title Pt will report compliance with HEP to promote independent management of shoulder pain   Baseline 3x/week (progressing well).    Time 4   Period Weeks   Status Partially Met     PT LONG TERM GOAL #5   Title Pt will report <2/10 pain with reaching/lifting tasks to promote functional ability so that she can participate in household tasks   Baseline Occasional 2-3/10 R shoulder pain with certain tasks.    Time 4   Period Weeks   Status Partially Met        Patient will benefit from skilled therapeutic intervention in order to improve the following deficits and impairments:  Decreased activity tolerance, Decreased endurance, Decreased mobility, Decreased range of motion, Decreased strength, Hypomobility, Increased edema, Increased muscle spasms, Impaired flexibility, Impaired tone, Impaired UE functional use, Improper body mechanics, Postural  dysfunction  Visit Diagnosis: Muscle weakness (generalized)  Pain in right shoulder  Stiffness of right shoulder, not elsewhere classified     Problem List There are no active problems to display for this patient.  Pura Spice, PT, DPT # [phone]  08/22/2015, 6:09 PM  [facility] [address] [place], [place], [address] Phone: [phone]   Fax:  [phone]  Name: Tamlyn Sides MRN: [id number] Date of Birth: 11-24-1950

## 2015-08-28 ENCOUNTER — Ambulatory Visit: Payer: Medicare Other | Admitting: Physical Therapy

## 2015-08-28 ENCOUNTER — Encounter: Payer: Self-pay | Admitting: Physical Therapy

## 2015-08-28 DIAGNOSIS — M25511 Pain in right shoulder: Secondary | ICD-10-CM

## 2015-08-28 DIAGNOSIS — M6281 Muscle weakness (generalized): Secondary | ICD-10-CM

## 2015-08-28 DIAGNOSIS — M25611 Stiffness of right shoulder, not elsewhere classified: Secondary | ICD-10-CM

## 2015-08-28 NOTE — Therapy (Signed)
[facility] [address] [place], [place], [address] Phone: [phone]   Fax:  [phone]  Physical Therapy Treatment  Patient Details  Name: Darlene Chen MRN: [id number] Date of Birth: 19-Jul-1950 Referring Provider: Lattie Corns  Encounter Date: 08/28/2015      PT End of Session - 08/28/15 0818    Visit Number 7   Number of Visits 8   Date for PT Re-Evaluation 08/29/15   Authorization - Visit Number 7   Authorization - Number of Visits 10   PT Start Time 3734   PT Stop Time 0816   PT Time Calculation (min) 45 min   Activity Tolerance Patient tolerated treatment well;No increased pain   Behavior During Therapy [facility] for tasks assessed/performed      History reviewed. No pertinent past medical history.  Past Surgical History:  Procedure Laterality Date  . REDUCTION MAMMAPLASTY Bilateral    1990    There were no vitals filed for this visit.      Subjective Assessment - 08/28/15 0817    Subjective Pt states she is not scheduled for anymore visits with PT; states she is not having pain/discomfort with reaching tasks and feels that she is ready for indep management.   Pertinent History Initial onset of shoulder pain November 2016. Pt got relief with injection. 6 months later pain resurfaced, pt recieved a second injection with no benefit. 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   Limitations House hold activities;Lifting   Patient Stated Goals pt would like to be pain free with R shoulder motion   Currently in Pain? No/denies      Objective:   Therapeutic Exercise: UBE 2 min.frwds/backwards (warm up/ no charge). Seated Nautilus: 30# lat. Pull downs/ standing tricep ext./ shoulder adduction, shoulder extension 30xeach. Scap. Retraction 30# 30x.Chest press 2x10 #20. 3# bicep curls 30x.Standing B shoulder AROM all planes. Supine serratus punches 2# 10x2. Seated shoulder  flexion #2.5 2x10.     Pt response for medical necessity: Pt demonstrates good tolerance to strengthening there ex with no increase in pain/or mention of discomfort in right shoulder. She demonstrates comparable functional mobility or R/L shoulder and has no further complaints at this time. Pt will progress to independent management of shoulder strengthening progression and will f/u with PT prn.      PT Long Term Goals - 08/28/15 2876      PT LONG TERM GOAL #1   Title Pt will score <25% on QuickDash to promote percieved functional mobility/ability   Baseline 8/7:  20.5% (marked improvement since initial evaluation). 8/23: 4.54    Time 4   Period Weeks   Status Achieved     PT LONG TERM GOAL #2   Title Pt will achieve within 10 deg of AROM on R shoulder as compared to L shoulder to promote longterm mobility   Baseline 08/12/15 :  R/L flexion 156/155. abduction 155/155. IR 55/57. ER. 80/83   Time 4   Period Weeks   Status Achieved     PT LONG TERM GOAL #3   Title Pt will improve MMT strength testing by 1/2 grade to promote return of functional strength   Baseline R grip: 67# and L grip: 62#   Time 4   Period Weeks   Status Achieved     PT LONG TERM GOAL #4   Title Pt will report compliance with HEP to promote independent management of shoulder pain   Baseline 3x/week (progressing  well).    Time 4   Period Weeks   Status Partially Met     PT LONG TERM GOAL #5   Title Pt will report <2/10 pain with reaching/lifting tasks to promote functional ability so that she can participate in household tasks   Baseline Occasional 2-3/10 R shoulder pain with certain tasks.    Time 4   Period Weeks   Status Achieved               Plan - 09/16/2015 0818    Clinical Impression Statement Pt demonstrates improvement in percieved functional mobility, shoulder strength and shoulder ROM. At this time she presents with no c/o of R shoulder pain and is able to complete strengthening  program independently with discussed HEP. She will f/u with PT if necessary.   Clinical Impairments Affecting Rehab Potential Negative: chronic nature of pain, comorbidities. Positive: motivated, family support   PT Frequency 2x / week   PT Duration 4 weeks   PT Treatment/Interventions ADLs/Self Care Home Management;Biofeedback;Cryotherapy;Electrical Stimulation;Iontophoresis 59m/ml Dexamethasone;Moist Heat;Ultrasound;Functional mobility training;Therapeutic activities;Therapeutic exercise;Patient/family education;Manual techniques;Passive range of motion;Dry needling   PT Next Visit Plan Continue to progress shoulder strengthening as tolerated.  Reassess goals.     PT Home Exercise Plan see HEP   Consulted and Agree with Plan of Care Patient      Patient will benefit from skilled therapeutic intervention in order to improve the following deficits and impairments:  Decreased activity tolerance, Decreased endurance, Decreased mobility, Decreased range of motion, Decreased strength, Hypomobility, Increased edema, Increased muscle spasms, Impaired flexibility, Impaired tone, Impaired UE functional use, Improper body mechanics, Postural dysfunction  Visit Diagnosis: Muscle weakness (generalized)  Pain in right shoulder  Stiffness of right shoulder, not elsewhere classified       G-Codes - [id number]    Functional Assessment Tool Used clinical impression/ QuickDASH/ pain/ ROM/ muscle weakness   Functional Limitation Carrying, moving and handling objects   Carrying, Moving and Handling Objects Current Status ((D5831 At least 1 percent but less than 20 percent impaired, limited or restricted   Carrying, Moving and Handling Objects Goal Status ((A7425 At least 1 percent but less than 20 percent impaired, limited or restricted   Carrying, Moving and Handling Objects Discharge Status [phone] At least 1 percent but less than 20 percent impaired, limited or restricted      Problem List There  are no active problems to display for this patient.  MPura Spice PT, DPT # [id number] Memo SPT 08/29/2015, 2:57 PM   [facility] [place] [address] [address]Phone: [phone]  Fax:  [phone] Name: REldena DedeMRN: [id number] of Birth: 104/14/1952

## 2016-01-29 DIAGNOSIS — R739 Hyperglycemia, unspecified: Secondary | ICD-10-CM | POA: Insufficient documentation

## 2016-06-17 DIAGNOSIS — G4733 Obstructive sleep apnea (adult) (pediatric): Secondary | ICD-10-CM | POA: Insufficient documentation

## 2016-08-07 DIAGNOSIS — Z8249 Family history of ischemic heart disease and other diseases of the circulatory system: Secondary | ICD-10-CM | POA: Insufficient documentation

## 2016-09-10 ENCOUNTER — Other Ambulatory Visit: Payer: Self-pay | Admitting: Family Medicine

## 2016-09-10 DIAGNOSIS — Z1231 Encounter for screening mammogram for malignant neoplasm of breast: Secondary | ICD-10-CM

## 2016-09-30 ENCOUNTER — Ambulatory Visit: Payer: Medicare Other

## 2016-12-03 DIAGNOSIS — I671 Cerebral aneurysm, nonruptured: Secondary | ICD-10-CM | POA: Insufficient documentation

## 2016-12-03 DIAGNOSIS — I10 Essential (primary) hypertension: Secondary | ICD-10-CM | POA: Insufficient documentation

## 2017-01-05 HISTORY — PX: BREAST BIOPSY: SHX20

## 2017-02-09 ENCOUNTER — Encounter: Payer: Self-pay | Admitting: *Deleted

## 2017-02-09 ENCOUNTER — Encounter
Admission: RE | Disposition: A | Discharge status: Home / Self Care | Payer: Self-pay | Source: Ambulatory Visit | Attending: Internal Medicine

## 2017-02-09 ENCOUNTER — Ambulatory Visit
Admission: RE | Admit: 2017-02-09 | Discharge: 2017-02-09 | Disposition: A | Discharge status: Home / Self Care | Payer: Medicare HMO | Source: Ambulatory Visit | Attending: Internal Medicine | Admitting: Internal Medicine

## 2017-02-09 ENCOUNTER — Other Ambulatory Visit: Payer: Self-pay | Admitting: Internal Medicine

## 2017-02-09 DIAGNOSIS — Z7951 Long term (current) use of inhaled steroids: Secondary | ICD-10-CM | POA: Diagnosis not present

## 2017-02-09 DIAGNOSIS — E785 Hyperlipidemia, unspecified: Secondary | ICD-10-CM | POA: Insufficient documentation

## 2017-02-09 DIAGNOSIS — M810 Age-related osteoporosis without current pathological fracture: Secondary | ICD-10-CM | POA: Insufficient documentation

## 2017-02-09 DIAGNOSIS — G4733 Obstructive sleep apnea (adult) (pediatric): Secondary | ICD-10-CM | POA: Insufficient documentation

## 2017-02-09 DIAGNOSIS — I10 Essential (primary) hypertension: Secondary | ICD-10-CM | POA: Insufficient documentation

## 2017-02-09 DIAGNOSIS — I200 Unstable angina: Secondary | ICD-10-CM | POA: Insufficient documentation

## 2017-02-09 DIAGNOSIS — Z882 Allergy status to sulfonamides status: Secondary | ICD-10-CM | POA: Insufficient documentation

## 2017-02-09 DIAGNOSIS — Z7984 Long term (current) use of oral hypoglycemic drugs: Secondary | ICD-10-CM | POA: Insufficient documentation

## 2017-02-09 DIAGNOSIS — Z6841 Body Mass Index (BMI) 40.0 and over, adult: Secondary | ICD-10-CM | POA: Insufficient documentation

## 2017-02-09 DIAGNOSIS — J45909 Unspecified asthma, uncomplicated: Secondary | ICD-10-CM | POA: Insufficient documentation

## 2017-02-09 DIAGNOSIS — Z7982 Long term (current) use of aspirin: Secondary | ICD-10-CM | POA: Insufficient documentation

## 2017-02-09 DIAGNOSIS — M1710 Unilateral primary osteoarthritis, unspecified knee: Secondary | ICD-10-CM | POA: Insufficient documentation

## 2017-02-09 DIAGNOSIS — R7303 Prediabetes: Secondary | ICD-10-CM | POA: Insufficient documentation

## 2017-02-09 HISTORY — PX: LEFT HEART CATH AND CORONARY ANGIOGRAPHY: CATH118249

## 2017-02-09 LAB — GLUCOSE, CAPILLARY
GLUCOSE-CAPILLARY: 98 mg/dL (ref 65–99)
Glucose-Capillary: 139 mg/dL — ABNORMAL HIGH (ref 65–99)

## 2017-02-09 LAB — CARDIAC CATHETERIZATION: Cath EF Quantitative: 60 %

## 2017-02-09 SURGERY — LEFT HEART CATH AND CORONARY ANGIOGRAPHY
Anesthesia: Moderate Sedation

## 2017-02-09 MED ORDER — MIDAZOLAM HCL 2 MG/2ML IJ SOLN
INTRAMUSCULAR | Status: AC
  Filled 2017-02-09: qty 2

## 2017-02-09 MED ORDER — SODIUM CHLORIDE 0.9 % IV SOLN: 250.0000 mL | INTRAVENOUS | Status: DC | PRN

## 2017-02-09 MED ORDER — ASPIRIN 81 MG PO CHEW
81.0000 mg | CHEWABLE_TABLET | ORAL | Status: AC
  Administered 2017-02-09: 81 mg via ORAL

## 2017-02-09 MED ORDER — SODIUM CHLORIDE 0.9% FLUSH: 3.0000 mL | Freq: Two times a day (BID) | INTRAVENOUS | Status: DC

## 2017-02-09 MED ORDER — ONDANSETRON HCL 4 MG/2ML IJ SOLN: 4.0000 mg | Freq: Four times a day (QID) | INTRAMUSCULAR | Status: DC | PRN

## 2017-02-09 MED ORDER — SODIUM CHLORIDE 0.9% FLUSH: 3.0000 mL | INTRAVENOUS | Status: DC | PRN

## 2017-02-09 MED ORDER — HEPARIN (PORCINE) IN NACL 2-0.9 UNIT/ML-% IJ SOLN
INTRAMUSCULAR | Status: AC
  Filled 2017-02-09: qty 500

## 2017-02-09 MED ORDER — SODIUM CHLORIDE 0.9 % WEIGHT BASED INFUSION
3.0000 mL/kg/h | INTRAVENOUS | Status: AC
  Administered 2017-02-09: 3 mL/kg/h via INTRAVENOUS

## 2017-02-09 MED ORDER — FENTANYL CITRATE (PF) 100 MCG/2ML IJ SOLN
INTRAMUSCULAR | Status: DC | PRN
  Administered 2017-02-09: 25 ug via INTRAVENOUS

## 2017-02-09 MED ORDER — FENTANYL CITRATE (PF) 100 MCG/2ML IJ SOLN
INTRAMUSCULAR | Status: AC
  Filled 2017-02-09: qty 2

## 2017-02-09 MED ORDER — IOPAMIDOL (ISOVUE-300) INJECTION 61%
INTRAVENOUS | Status: DC | PRN
  Administered 2017-02-09: 80 mL via INTRA_ARTERIAL

## 2017-02-09 MED ORDER — SODIUM CHLORIDE 0.9 % WEIGHT BASED INFUSION: 1.0000 mL/kg/h | INTRAVENOUS | Status: DC

## 2017-02-09 MED ORDER — ASPIRIN 81 MG PO CHEW
CHEWABLE_TABLET | ORAL | Status: AC
  Filled 2017-02-09: qty 1

## 2017-02-09 MED ORDER — ACETAMINOPHEN 325 MG PO TABS: 650.0000 mg | ORAL_TABLET | ORAL | Status: DC | PRN

## 2017-02-09 MED ORDER — MIDAZOLAM HCL 2 MG/2ML IJ SOLN
INTRAMUSCULAR | Status: DC | PRN
  Administered 2017-02-09: 1 mg via INTRAVENOUS

## 2017-02-09 SURGICAL SUPPLY — 9 items
CATH 5FR JR4 DIAGNOSTIC (CATHETERS) ×3 IMPLANT
CATH INFINITI 5FR ANG PIGTAIL (CATHETERS) ×3 IMPLANT
CATH INFINITI 5FR JL4 (CATHETERS) ×3 IMPLANT
DEVICE CLOSURE MYNXGRIP 5F (Vascular Products) ×3 IMPLANT
KIT MANI 3VAL PERCEP (MISCELLANEOUS) ×3 IMPLANT
NEEDLE PERC 18GX7CM (NEEDLE) ×3 IMPLANT
PACK CARDIAC CATH (CUSTOM PROCEDURE TRAY) ×3 IMPLANT
SHEATH AVANTI 5FR X 11CM (SHEATH) ×3 IMPLANT
WIRE GUIDERIGHT .035X150 (WIRE) ×3 IMPLANT

## 2017-02-10 ENCOUNTER — Encounter: Payer: Self-pay | Admitting: Internal Medicine

## 2017-02-19 ENCOUNTER — Other Ambulatory Visit: Payer: Self-pay | Admitting: Internal Medicine

## 2017-02-19 DIAGNOSIS — I714 Abdominal aortic aneurysm, without rupture, unspecified: Secondary | ICD-10-CM

## 2017-06-28 ENCOUNTER — Inpatient Hospital Stay: Payer: Medicare HMO

## 2017-06-28 ENCOUNTER — Encounter: Payer: Self-pay | Admitting: Oncology

## 2017-06-28 ENCOUNTER — Inpatient Hospital Stay: Payer: Medicare HMO | Attending: Oncology | Admitting: Oncology

## 2017-06-28 VITALS — BP 142/91 | HR 80 | Temp 98.1°F | Resp 18 | Ht 59.0 in | Wt 220.2 lb

## 2017-06-28 DIAGNOSIS — R109 Unspecified abdominal pain: Secondary | ICD-10-CM

## 2017-06-28 DIAGNOSIS — E7800 Pure hypercholesterolemia, unspecified: Secondary | ICD-10-CM | POA: Diagnosis not present

## 2017-06-28 DIAGNOSIS — M25562 Pain in left knee: Secondary | ICD-10-CM

## 2017-06-28 DIAGNOSIS — G8929 Other chronic pain: Secondary | ICD-10-CM | POA: Diagnosis not present

## 2017-06-28 DIAGNOSIS — Z79899 Other long term (current) drug therapy: Secondary | ICD-10-CM

## 2017-06-28 DIAGNOSIS — E119 Type 2 diabetes mellitus without complications: Secondary | ICD-10-CM | POA: Diagnosis not present

## 2017-06-28 DIAGNOSIS — D709 Neutropenia, unspecified: Secondary | ICD-10-CM | POA: Diagnosis not present

## 2017-06-28 DIAGNOSIS — I10 Essential (primary) hypertension: Secondary | ICD-10-CM

## 2017-06-28 DIAGNOSIS — M549 Dorsalgia, unspecified: Secondary | ICD-10-CM

## 2017-06-28 DIAGNOSIS — Z7982 Long term (current) use of aspirin: Secondary | ICD-10-CM | POA: Diagnosis not present

## 2017-06-28 DIAGNOSIS — Z7984 Long term (current) use of oral hypoglycemic drugs: Secondary | ICD-10-CM

## 2017-06-28 DIAGNOSIS — M25561 Pain in right knee: Secondary | ICD-10-CM

## 2017-06-28 LAB — FOLATE: FOLATE: 12.8 ng/mL (ref >5.9)

## 2017-06-28 LAB — COMPREHENSIVE METABOLIC PANEL
ALK PHOS: 64 U/L (ref 38–126)
ALT: 16 U/L (ref 14–54)
ANION GAP: 15 (ref 5–15)
AST: 20 U/L (ref 15–41)
Albumin: 4.2 g/dL (ref 3.5–5.0)
BILIRUBIN TOTAL: 0.5 mg/dL (ref 0.3–1.2)
BUN: 14 mg/dL (ref 6–20)
CALCIUM: 9.5 mg/dL (ref 8.9–10.3)
CO2: 26 mmol/L (ref 22–32)
Chloride: 98 mmol/L — ABNORMAL LOW (ref 101–111)
Creatinine, Ser: 0.67 mg/dL (ref 0.44–1.00)
Glucose, Bld: 92 mg/dL (ref 65–99)
POTASSIUM: 3.9 mmol/L (ref 3.5–5.1)
Sodium: 139 mmol/L (ref 135–145)
TOTAL PROTEIN: 8.2 g/dL — AB (ref 6.5–8.1)

## 2017-06-28 LAB — CBC WITH DIFFERENTIAL/PLATELET
BASOS PCT: 1 %
Basophils Absolute: 0.1 10*3/uL (ref 0–0.1)
Eosinophils Absolute: 0 10*3/uL (ref 0–0.7)
Eosinophils Relative: 0 %
HEMATOCRIT: 40.9 % (ref 35.0–47.0)
HEMOGLOBIN: 13.7 g/dL (ref 12.0–16.0)
Lymphocytes Relative: 38 %
Lymphs Abs: 1.6 10*3/uL (ref 1.0–3.6)
MCH: 31.8 pg (ref 26.0–34.0)
MCHC: 33.5 g/dL (ref 32.0–36.0)
MCV: 95 fL (ref 80.0–100.0)
Monocytes Absolute: 0.3 10*3/uL (ref 0.2–0.9)
Monocytes Relative: 7 %
NEUTROS ABS: 2.3 10*3/uL (ref 1.4–6.5)
Neutrophils Relative %: 54 %
Platelets: 382 10*3/uL (ref 150–440)
RBC: 4.31 MIL/uL (ref 3.80–5.20)
RDW: 14.7 % — AB (ref 11.5–14.5)
WBC: 4.2 10*3/uL (ref 3.6–11.0)

## 2017-06-28 LAB — VITAMIN B12: VITAMIN B 12: 257 pg/mL (ref 180–914)

## 2017-06-28 LAB — TECHNOLOGIST SMEAR REVIEW

## 2017-06-28 NOTE — Progress Notes (Signed)
Hematology/Oncology Consult note [facility] [phone] [phone]  Patient Care Team: Sharyne Peach, MD as PCP - General (Family Medicine)   Name of the patient: Darlene Chen  [id number]  31-Oct-1950    Reason for referral- neutropenia   Referring physician- Dr. Iona Beard  Date of visit: 06/28/17   History of presenting illness- patient is a 67 yr old african [place] female referred for neutropenia. Patient had a normal wbc in 2016 at 4.4. Between 2018 and 2019, her wbc has been 3.7-3.8. Most recently on 06/15/17 her wbc was 2.8, H/H 12.7/39 and platelet count of 300. anc was 0.9. In 2018 it was 1.6 and in feb 2019 anc was 1.9. We do not have any other prior counts for comparison. Patient reports occasional sporadic episodes of back pain and abdominal pain which last for a few minutes and then go away. These are far and few. Appetite is good. Denies any unintentional weight loss, drenching night sweats or recurrent infections. Denies any OTC medications or herbal supplements. No recent changes in her medications. She has chronic b/l knee pain for which she gets joint injections. Denies pain or swelling in other joints. No oral ulcers or photophobia. No family h/o neutropenia or lymphoma/ leukemia  ECOG PS- 0  Pain scale- 0   Review of systems- Review of Systems  Constitutional: Negative for chills, fever, malaise/fatigue and weight loss.  HENT: Negative for congestion, ear discharge and nosebleeds.   Eyes: Negative for blurred vision.  Respiratory: Negative for cough, hemoptysis, sputum production, shortness of breath and wheezing.   Cardiovascular: Negative for chest pain, palpitations, orthopnea and claudication.  Gastrointestinal: Negative for abdominal pain, blood in stool, constipation, diarrhea, heartburn, melena, nausea and vomiting.  Genitourinary: Negative for dysuria, flank pain, frequency, hematuria and urgency.    Musculoskeletal: Positive for back pain. Negative for joint pain and myalgias.  Skin: Negative for rash.  Neurological: Negative for dizziness, tingling, focal weakness, seizures, weakness and headaches.  Endo/Heme/Allergies: Does not bruise/bleed easily.  Psychiatric/Behavioral: Negative for depression and suicidal ideas. The patient does not have insomnia.     Allergies  Allergen Reactions  . Biaxin [Clarithromycin] Hives  . Niacin Other (See Comments)    Burning from inside   . Statins Other (See Comments)    Muscle/joint pain.  . Sulfa Antibiotics Hives  . Tramadol Other (See Comments)    Unsure of exact reaction type  . Ezetimibe Other (See Comments)    Muscle/joint pain    There are no active problems to display for this patient.    Past Medical History:  Diagnosis Date  . Diabetes mellitus without complication ([place])   . High cholesterol   . Hypertension   . Neutropenia [facility])      Past Surgical History:  Procedure Laterality Date  . arthroscopic knee surgery  on left 2000    . LEFT HEART CATH AND CORONARY ANGIOGRAPHY N/A 02/09/2017   Procedure: LEFT HEART CATH AND CORONARY ANGIOGRAPHY;  Surgeon: Yolonda Kida, MD;  Location: [place] [facility];  Service: Cardiovascular;  Laterality: N/A;  . REDUCTION MAMMAPLASTY Bilateral    1990  . TUBAL LIGATION  1985  . UMBILICAL HERNIA REPAIR  2007    Social History   Socioeconomic History  . Marital status: Married    Spouse name: Not on file  . Number of children: Not on file  . Years of education: Not on file  . Highest education level: Not on file  Occupational  History  . Not on file  Social Needs  . Financial resource strain: Not on file  . Food insecurity:    Worry: Not on file    Inability: Not on file  . Transportation needs:    Medical: Not on file    Non-medical: Not on file  Tobacco Use  . Smoking status: Never Smoker  . Smokeless tobacco: Never Used  Substance and Sexual Activity  .  Alcohol use: No  . Drug use: No  . Sexual activity: Not on file  Lifestyle  . Physical activity:    Days per week: Not on file    Minutes per session: Not on file  . Stress: Not on file  Relationships  . Social connections:    Talks on phone: Not on file    Gets together: Not on file    Attends religious service: Not on file    Active member of club or organization: Not on file    Attends meetings of clubs or organizations: Not on file    Relationship status: Not on file  . Intimate partner violence:    Fear of current or ex partner: Not on file    Emotionally abused: Not on file    Physically abused: Not on file    Forced sexual activity: Not on file  Other Topics Concern  . Not on file  Social History Narrative  . Not on file     Family History  Problem Relation Age of Onset  . Breast cancer Sister 97     Current Outpatient Medications:  .  amLODipine (NORVASC) 10 MG tablet, Take 10 mg by mouth daily., Disp: , Rfl:  .  aspirin EC 81 MG tablet, Take 81 mg by mouth daily., Disp: , Rfl:  .  colesevelam (WELCHOL) 625 MG tablet, Take 3,750 mg by mouth daily after supper. , Disp: , Rfl:  .  cycloSPORINE (RESTASIS) 0.05 % ophthalmic emulsion, Place 1 drop into both eyes 2 (two) times daily as needed. , Disp: , Rfl:  .  fluocinonide cream (LIDEX) 9.67 %, Apply 1 application topically 2 (two) times daily as needed. For eczema, Disp: , Rfl: 0 .  hydrochlorothiazide (HYDRODIURIL) 12.5 MG tablet, Take 12.5 mg by mouth daily., Disp: , Rfl:  .  metFORMIN (GLUCOPHAGE) 500 MG tablet, Take 500 mg by mouth 2 (two) times daily., Disp: , Rfl:  .  metoprolol succinate (TOPROL-XL) 25 MG 24 hr tablet, Take 25 mg by mouth daily., Disp: , Rfl:    Physical exam:  Vitals:   06/28/17 1331  BP: (!) 142/91  Pulse: 80  Resp: 18  Temp: 98.1 F (36.7 C)  TempSrc: Tympanic  Weight: 220 lb 3.8 oz (99.9 kg)  Height: 4\' 11"  (1.499 m)   Physical Exam  Constitutional: She is oriented to person,  place, and time.  Patient is obese. Does not appear to be in any acute distress  HENT:  Head: Normocephalic and atraumatic.  Eyes: Pupils are equal, round, and reactive to light. EOM are normal.  Neck: Normal range of motion.  Cardiovascular: Normal rate, regular rhythm and normal heart sounds.  Pulmonary/Chest: Effort normal and breath sounds normal.  Abdominal: Soft. Bowel sounds are normal.  No palpable splenomegaly  Lymphadenopathy:  No palpable cervical, supraclavicular, axillary or inguinal adenopathy   Neurological: She is alert and oriented to person, place, and time.  Skin: Skin is warm and dry.     Assessment and plan- Patient is a 67 y.o. female  referred for neutropenia  Patient has isolated leukopenia/ neutropenia that waxes wand wanes. 1.6 in 2018 then 1.9 in 2019 and now down to 0.9. 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 No B symptoms, palpable adenopathy, signs and symptoms of infection or rheumatological conditions. Suspicion for primary bone marrow disorder is low especially in the absence of other cytopenias  I will check cbc with diff, b12, folate, HIV and hepatitis testing and peripheral smear review. I will see her back in 2 weeks time to discuss results of her bloodwork.   Thank you for this kind referral and the opportunity to participate in the care of this patient   Visit Diagnosis 1. Neutropenia, unspecified type [facility])     Dr. Randa Evens, MD, MPH [facility] at [facility] [phone] 06/28/2017 2:25 PM

## 2017-06-28 NOTE — Progress Notes (Signed)
Pt with no sx of wbc low, pt has no infections that she can recall.

## 2017-06-29 LAB — HEPATITIS B CORE ANTIBODY, TOTAL: HEP B C TOTAL AB: NEGATIVE

## 2017-06-29 LAB — HIV ANTIBODY (ROUTINE TESTING W REFLEX): HIV SCREEN 4TH GENERATION: NONREACTIVE

## 2017-06-29 LAB — HEPATITIS C ANTIBODY

## 2017-07-12 ENCOUNTER — Encounter: Payer: Self-pay | Admitting: Oncology

## 2017-07-12 ENCOUNTER — Inpatient Hospital Stay: Payer: Medicare HMO | Attending: Oncology | Admitting: Oncology

## 2017-07-12 VITALS — BP 139/64 | HR 64 | Temp 97.7°F | Resp 18 | Ht 59.0 in | Wt 221.3 lb

## 2017-07-12 DIAGNOSIS — R109 Unspecified abdominal pain: Secondary | ICD-10-CM | POA: Diagnosis not present

## 2017-07-12 DIAGNOSIS — I10 Essential (primary) hypertension: Secondary | ICD-10-CM | POA: Insufficient documentation

## 2017-07-12 DIAGNOSIS — G8929 Other chronic pain: Secondary | ICD-10-CM | POA: Diagnosis not present

## 2017-07-12 DIAGNOSIS — L309 Dermatitis, unspecified: Secondary | ICD-10-CM | POA: Diagnosis not present

## 2017-07-12 DIAGNOSIS — Z7984 Long term (current) use of oral hypoglycemic drugs: Secondary | ICD-10-CM | POA: Diagnosis not present

## 2017-07-12 DIAGNOSIS — E7800 Pure hypercholesterolemia, unspecified: Secondary | ICD-10-CM | POA: Diagnosis not present

## 2017-07-12 DIAGNOSIS — M25562 Pain in left knee: Secondary | ICD-10-CM | POA: Insufficient documentation

## 2017-07-12 DIAGNOSIS — M549 Dorsalgia, unspecified: Secondary | ICD-10-CM | POA: Diagnosis not present

## 2017-07-12 DIAGNOSIS — Z7982 Long term (current) use of aspirin: Secondary | ICD-10-CM | POA: Diagnosis not present

## 2017-07-12 DIAGNOSIS — E119 Type 2 diabetes mellitus without complications: Secondary | ICD-10-CM | POA: Diagnosis not present

## 2017-07-12 DIAGNOSIS — M25561 Pain in right knee: Secondary | ICD-10-CM | POA: Diagnosis not present

## 2017-07-12 DIAGNOSIS — Z79899 Other long term (current) drug therapy: Secondary | ICD-10-CM | POA: Insufficient documentation

## 2017-07-12 DIAGNOSIS — D709 Neutropenia, unspecified: Secondary | ICD-10-CM | POA: Insufficient documentation

## 2017-07-12 DIAGNOSIS — E538 Deficiency of other specified B group vitamins: Secondary | ICD-10-CM | POA: Diagnosis not present

## 2017-07-12 NOTE — Progress Notes (Signed)
No new changes noted today 

## 2017-07-12 NOTE — Progress Notes (Signed)
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 Patient had a normal wbc in 2016 at 4.4. Between 2018 and 2019, her wbc has been 3.7-3.8. Most recently on 06/15/17 her wbc was 2.8, H/H 12.7/39 and platelet count of 300. anc was 0.9. In 2018 it was 1.6 and in feb 2019 anc was 1.9. We do not have any other prior counts for comparison. Patient reports occasional sporadic episodes of back pain and abdominal pain which last for a few minutes and then go away. These are far and few. Appetite is good. Denies any unintentional weight loss, drenching night sweats or recurrent infections. Denies any OTC medications or herbal supplements. No recent changes in her medications. She has chronic b/l knee pain for which she gets joint injections. Denies pain or swelling in other joints. No oral ulcers or photophobia. No family h/o neutropenia or lymphoma/ leukemia  Results of bloodwork from 06/28/17 were as follows: cbc showed wbc of 4.2 with normal differential. anc was 2.3. H/H 13.7/40.9 and platelet count 382. Folate level was normal. HIV and hep C testing and hep B testing negative. B12 level borderline low at 257. Peripheral smear review unremarkable  Interval history- she is doing well. Denies any complaints today  ECOG PS- 0 Pain scale- 0  Review of systems- Review of Systems  Constitutional: Negative for chills, fever, malaise/fatigue and weight loss.  HENT: Negative for congestion, ear discharge and nosebleeds.   Eyes: Negative for blurred vision.    Respiratory: Negative for cough, hemoptysis, sputum production, shortness of breath and wheezing.   Cardiovascular: Negative for chest pain, palpitations, orthopnea and claudication.  Gastrointestinal: Negative for abdominal pain, blood in stool, constipation, diarrhea, heartburn, melena, nausea and vomiting.  Genitourinary: Negative for dysuria, flank pain, frequency, hematuria and urgency.  Musculoskeletal: Negative for back pain, joint pain and myalgias.  Skin: Negative for rash.  Neurological: Negative for dizziness, tingling, focal weakness, seizures, weakness and headaches.  Endo/Heme/Allergies: Does not bruise/bleed easily.  Psychiatric/Behavioral: Negative for depression and suicidal ideas. The patient does not have insomnia.       Allergies  Allergen Reactions  . Biaxin [Clarithromycin] Hives  . Niacin Other (See Comments)    Burning from inside   . Statins Other (See Comments)    Muscle/joint pain.  . Sulfa Antibiotics Hives  . Tramadol Other (See Comments)    Unsure of exact reaction type  . Ezetimibe Other (See Comments)    Muscle/joint pain     Past Medical History:  Diagnosis Date  . Diabetes mellitus without complication ([place])   . High cholesterol   . Hypertension   . Neutropenia [facility])      Past Surgical History:  Procedure Laterality Date  . arthroscopic knee surgery  on left 2000    . LEFT HEART CATH AND CORONARY ANGIOGRAPHY N/A 02/09/2017   Procedure: LEFT HEART CATH AND CORONARY ANGIOGRAPHY;  Surgeon: Yolonda Kida, MD;  Location: [place] [facility];  Service: Cardiovascular;  Laterality: N/A;  . REDUCTION MAMMAPLASTY Bilateral    1990  . TUBAL LIGATION  1985  . UMBILICAL  HERNIA REPAIR  2007    Social History   Socioeconomic History  . Marital status: Married    Spouse name: Not on file  . Number of children: Not on file  . Years of education: Not on file  . Highest education level: Not on file  Occupational History  . Not on file   Social Needs  . Financial resource strain: Not on file  . Food insecurity:    Worry: Not on file    Inability: Not on file  . Transportation needs:    Medical: Not on file    Non-medical: Not on file  Tobacco Use  . Smoking status: Never Smoker  . Smokeless tobacco: Never Used  Substance and Sexual Activity  . Alcohol use: No  . Drug use: No  . Sexual activity: Not on file  Lifestyle  . Physical activity:    Days per week: Not on file    Minutes per session: Not on file  . Stress: Not on file  Relationships  . Social connections:    Talks on phone: Not on file    Gets together: Not on file    Attends religious service: Not on file    Active member of club or organization: Not on file    Attends meetings of clubs or organizations: Not on file    Relationship status: Not on file  . Intimate partner violence:    Fear of current or ex partner: Not on file    Emotionally abused: Not on file    Physically abused: Not on file    Forced sexual activity: Not on file  Other Topics Concern  . Not on file  Social History Narrative  . Not on file    Family History  Problem Relation Age of Onset  . Breast cancer Sister 48     Current Outpatient Medications:  .  amLODipine (NORVASC) 10 MG tablet, Take 10 mg by mouth daily., Disp: , Rfl:  .  aspirin EC 81 MG tablet, Take 81 mg by mouth daily., Disp: , Rfl:  .  colesevelam (WELCHOL) 625 MG tablet, Take 3,750 mg by mouth daily after supper. , Disp: , Rfl:  .  hydrochlorothiazide (HYDRODIURIL) 12.5 MG tablet, Take 12.5 mg by mouth daily., Disp: , Rfl:  .  metFORMIN (GLUCOPHAGE) 500 MG tablet, Take 500 mg by mouth 2 (two) times daily., Disp: , Rfl:  .  metoprolol succinate (TOPROL-XL) 25 MG 24 hr tablet, Take 25 mg by mouth daily., Disp: , Rfl:  .  cycloSPORINE (RESTASIS) 0.05 % ophthalmic emulsion, Place 1 drop into both eyes 2 (two) times daily as needed. , Disp: , Rfl:  .  fluocinonide cream (LIDEX) 8.18 %, Apply 1 application  topically 2 (two) times daily as needed. For eczema, Disp: , Rfl: 0  Physical exam:  Vitals:   07/12/17 1353  BP: 139/64  Pulse: 64  Resp: 18  Temp: 97.7 F (36.5 C)  TempSrc: Tympanic  SpO2: 98%  Weight: 221 lb 5.5 oz (100.4 kg)  Height: 4\' 11"  (1.499 m)   Physical Exam  Constitutional: She is oriented to person, place, and time. She appears well-developed and well-nourished.  HENT:  Head: Normocephalic and atraumatic.  Eyes: Pupils are equal, round, and reactive to light. EOM are normal.  Neck: Normal range of motion.  Cardiovascular: Normal rate, regular rhythm and normal heart sounds.  Pulmonary/Chest: Effort normal and breath sounds normal.  Abdominal: Soft. Bowel sounds are normal.  Neurological: She is  alert and oriented to person, place, and time.  Skin: Skin is warm and dry.     CMP Latest Ref Rng & Units 06/28/2017  Glucose 65 - 99 mg/dL 92  BUN 6 - 20 mg/dL 14  Creatinine 0.44 - 1.00 mg/dL 0.67  Sodium 135 - 145 mmol/L 139  Potassium 3.5 - 5.1 mmol/L 3.9  Chloride 101 - 111 mmol/L 98(L)  CO2 22 - 32 mmol/L 26  Calcium 8.9 - 10.3 mg/dL 9.5  Total Protein 6.5 - 8.1 g/dL 8.2(H)  Total Bilirubin 0.3 - 1.2 mg/dL 0.5  Alkaline Phos 38 - 126 U/L 64  AST 15 - 41 U/L 20  ALT 14 - 54 U/L 16   CBC Latest Ref Rng & Units 06/28/2017  WBC 3.6 - 11.0 K/uL 4.2  Hemoglobin 12.0 - 16.0 g/dL 13.7  Hematocrit 35.0 - 47.0 % 40.9  Platelets 150 - 440 K/uL 382      Assessment and plan- Patient is a 67 y.o. female referred for leukopenia/ neutropenia  Patient has mild intermittent neutropenia which waxes and wanes and for the most part her wbc is normal with normal differential. Her b12 levels were borderline low at 257. I have asked her to take oral b12 1000 mcg daily  I will see her back in 6 months with cbc with diff and b12. If her wbc remains ormal, she can continue to f/u with her pcp   Visit Diagnosis 1. Neutropenia, unspecified type ([place])   2. B12 deficiency       Dr. Randa Evens, MD, MPH [facility] at [facility] [phone] 07/12/2017 2:21 PM

## 2017-07-15 ENCOUNTER — Encounter: Payer: Self-pay | Admitting: Family Medicine

## 2017-07-20 DIAGNOSIS — B351 Tinea unguium: Secondary | ICD-10-CM | POA: Insufficient documentation

## 2017-10-07 ENCOUNTER — Ambulatory Visit
Admission: RE | Admit: 2017-10-07 | Discharge: 2017-10-07 | Disposition: A | Discharge status: Home / Self Care | Payer: Medicare HMO | Source: Ambulatory Visit | Attending: Family Medicine | Admitting: Family Medicine

## 2017-10-07 ENCOUNTER — Other Ambulatory Visit: Payer: Self-pay | Admitting: Family Medicine

## 2017-10-07 DIAGNOSIS — M25512 Pain in left shoulder: Secondary | ICD-10-CM

## 2017-10-11 ENCOUNTER — Inpatient Hospital Stay: Payer: Medicare HMO | Attending: Oncology | Admitting: Oncology

## 2017-10-11 ENCOUNTER — Encounter: Payer: Self-pay | Admitting: Oncology

## 2017-10-11 VITALS — BP 125/61 | HR 74 | Temp 97.8°F | Resp 18 | Ht 59.0 in | Wt 224.8 lb

## 2017-10-11 DIAGNOSIS — Z7984 Long term (current) use of oral hypoglycemic drugs: Secondary | ICD-10-CM

## 2017-10-11 DIAGNOSIS — D709 Neutropenia, unspecified: Secondary | ICD-10-CM | POA: Diagnosis present

## 2017-10-11 DIAGNOSIS — E119 Type 2 diabetes mellitus without complications: Secondary | ICD-10-CM | POA: Insufficient documentation

## 2017-10-11 DIAGNOSIS — M549 Dorsalgia, unspecified: Secondary | ICD-10-CM | POA: Insufficient documentation

## 2017-10-11 DIAGNOSIS — Z79899 Other long term (current) drug therapy: Secondary | ICD-10-CM | POA: Insufficient documentation

## 2017-10-11 DIAGNOSIS — G8929 Other chronic pain: Secondary | ICD-10-CM | POA: Insufficient documentation

## 2017-10-11 DIAGNOSIS — M25562 Pain in left knee: Secondary | ICD-10-CM | POA: Diagnosis not present

## 2017-10-11 DIAGNOSIS — M25561 Pain in right knee: Secondary | ICD-10-CM | POA: Diagnosis not present

## 2017-10-11 DIAGNOSIS — R109 Unspecified abdominal pain: Secondary | ICD-10-CM | POA: Diagnosis not present

## 2017-10-11 DIAGNOSIS — I10 Essential (primary) hypertension: Secondary | ICD-10-CM | POA: Diagnosis not present

## 2017-10-11 DIAGNOSIS — Z7982 Long term (current) use of aspirin: Secondary | ICD-10-CM | POA: Diagnosis not present

## 2017-10-11 DIAGNOSIS — K862 Cyst of pancreas: Secondary | ICD-10-CM | POA: Insufficient documentation

## 2017-10-11 DIAGNOSIS — E7800 Pure hypercholesterolemia, unspecified: Secondary | ICD-10-CM | POA: Diagnosis not present

## 2017-10-11 NOTE — Addendum Note (Signed)
Addended by: Luella Cook on: 10/11/2017 12:54 PM   Modules accepted: Orders

## 2017-10-11 NOTE — Progress Notes (Signed)
No new changes noted today 

## 2017-10-11 NOTE — Progress Notes (Signed)
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 Patient had a normal wbc in 2016 at 4.4. Between 2018 and 2019, her wbc has been 3.7-3.8. Most recently on 06/15/17 her wbc was 2.8, H/H 12.7/39 and platelet count of 300. anc was 0.9. In 2018 it was 1.6 and in feb 2019 anc was 1.9. We do not have any other prior counts for comparison. Patient reports occasional sporadic episodes of back pain and abdominal pain which last for a few minutes and then go away. These are far and few. Appetite is good. Denies any unintentional weight loss, drenching night sweats or recurrent infections. Denies any OTC medications or herbal supplements. No recent changes in her medications. She has chronic b/l knee pain for which she gets joint injections. Denies pain or swelling in other joints. No oral ulcers or photophobia. No family h/o neutropenia or lymphoma/ leukemia  Results of bloodwork from 06/28/17 were as follows: cbc showed wbc of 4.2 with normal differential. anc was 2.3. H/H 13.7/40.9 and platelet count 382. Folate level was normal. HIV and hep C testing and hep B testing negative. B12 level borderline low at 257. Peripheral smear review unremarkable   Interval history- Patient sent back by Dr. Iona Beard at 3 months because of concerns with her wbc. Patient reports feeling well. Denies any unintentional weight loss, night sweats, lumps/ bumps anywhere. Denies any frequent infections/ hospitalizations  ECOG PS- 0 Pain scale- 0 Opioid associated constipation-  no  Review of systems- Review of Systems  Constitutional: Negative for chills, fever, malaise/fatigue and weight loss.  HENT: Negative for congestion, ear discharge and nosebleeds.   Eyes: Negative for blurred vision.  Respiratory: Negative for cough, hemoptysis, sputum production, shortness of breath and wheezing.   Cardiovascular: Negative for chest pain, palpitations, orthopnea and claudication.  Gastrointestinal: Negative for abdominal pain, blood in stool, constipation, diarrhea, heartburn, melena, nausea and vomiting.  Genitourinary: Negative for dysuria, flank pain, frequency, hematuria and urgency.  Musculoskeletal: Negative for back pain, joint pain and myalgias.  Skin: Negative for rash.  Neurological: Negative for dizziness, tingling, focal weakness, seizures, weakness and headaches.  Endo/Heme/Allergies: Does not bruise/bleed easily.  Psychiatric/Behavioral: Negative for depression and suicidal ideas. The patient does not have insomnia.       Allergies  Allergen Reactions  . Biaxin [Clarithromycin] Hives  . Niacin Other (See Comments)    Burning from inside   . Statins Other (See Comments)    Muscle/joint pain.  . Sulfa Antibiotics Hives  . Tramadol Other (See Comments)    Unsure of exact reaction type  . Ezetimibe Other (See Comments)    Muscle/joint pain     Past Medical History:  Diagnosis Date  . Diabetes mellitus without complication ([place])   . High cholesterol   . Hypertension   . Neutropenia [facility])      Past Surgical History:  Procedure Laterality Date  . arthroscopic knee surgery  on left 2000    . LEFT HEART CATH AND CORONARY ANGIOGRAPHY N/A 02/09/2017   Procedure: LEFT HEART CATH AND CORONARY ANGIOGRAPHY;  Surgeon: Yolonda Kida, MD;  Location: [place] [facility];  Service: Cardiovascular;  Laterality: N/A;  . REDUCTION MAMMAPLASTY Bilateral    1990  . TUBAL LIGATION  1985  . UMBILICAL HERNIA REPAIR  2007    Social History    Socioeconomic History  . Marital status: Married    Spouse name: Not on file  . Number of children: Not on file  . Years of education: Not on file  . Highest education level: Not on file  Occupational History  . Not on file  Social Needs  . Financial resource strain: Not on file  . Food insecurity:    Worry: Not on file    Inability: Not on file  . Transportation needs:    Medical: Not on file    Non-medical: Not on file  Tobacco Use  . Smoking status: Never Smoker  . Smokeless tobacco: Never Used  Substance and Sexual Activity  . Alcohol use: No  . Drug use: No  . Sexual activity: Not on file  Lifestyle  . Physical activity:    Days per week: Not on file    Minutes per session: Not on file  . Stress: Not on file  Relationships  . Social connections:    Talks on phone: Not on file    Gets together: Not on file    Attends religious service: Not on file    Active member of club or organization: Not on file    Attends meetings of clubs or organizations: Not on file    Relationship status: Not on file  . Intimate partner violence:    Fear of current or ex partner: Not on file    Emotionally abused: Not on file    Physically abused: Not on file    Forced sexual activity: Not on file  Other Topics Concern  . Not on file  Social History Narrative  . Not on file    Family History  Problem Relation Age of Onset  . Breast cancer Sister 28     Current Outpatient Medications:  .  amLODipine (NORVASC) 10 MG tablet, Take 10 mg by mouth daily., Disp: , Rfl:  .  aspirin EC 81 MG tablet, Take 81 mg by mouth daily., Disp: , Rfl:  .  colesevelam (WELCHOL) 625 MG tablet, Take 3,750 mg by mouth daily after supper. , Disp: , Rfl:  .  cycloSPORINE (RESTASIS) 0.05 % ophthalmic emulsion, Place 1 drop into both eyes 2 (two) times daily as needed. , Disp: , Rfl:  .  fluocinonide cream (LIDEX) 3.54 %, Apply 1 application topically 2 (two) times daily as needed. For eczema, Disp: ,  Rfl: 0 .  hydrochlorothiazide (HYDRODIURIL) 12.5 MG tablet, Take 12.5 mg by mouth daily., Disp: , Rfl:  .  metFORMIN (GLUCOPHAGE) 500 MG tablet, Take 500 mg by mouth 2 (two) times daily., Disp: , Rfl:  .  metoprolol succinate (TOPROL-XL) 25 MG 24 hr tablet, Take 25 mg by mouth daily., Disp: , Rfl:   Physical exam:  Vitals:   10/11/17 1130  BP: 125/61  Pulse: 74  Resp: 18  Temp: 97.8 F (36.6 C)  TempSrc: Tympanic  SpO2: 97%  Weight: 224 lb 12.1 oz (101.9 kg)  Height: '4\' 11"'$  (1.499 m)   Physical Exam  Constitutional: She is oriented to person, place, and time.  HENT:  Head: Normocephalic and atraumatic.  Eyes: Pupils are equal, round, and reactive to light. EOM are normal.  Neck: Normal range of motion.  Cardiovascular: Normal rate, regular  rhythm and normal heart sounds.  Pulmonary/Chest: Effort normal and breath sounds normal.  Abdominal: Soft. Bowel sounds are normal.  No palpable splenomegaly  Lymphadenopathy:  No palpable cervical, supraclavicular, axillary or inguinal adenopathy   Neurological: She is alert and oriented to person, place, and time.  Skin: Skin is warm and dry.     CMP Latest Ref Rng & Units 06/28/2017  Glucose 65 - 99 mg/dL 92  BUN 6 - 20 mg/dL 14  Creatinine 0.44 - 1.00 mg/dL 0.67  Sodium 135 - 145 mmol/L 139  Potassium 3.5 - 5.1 mmol/L 3.9  Chloride 101 - 111 mmol/L 98(L)  CO2 22 - 32 mmol/L 26  Calcium 8.9 - 10.3 mg/dL 9.5  Total Protein 6.5 - 8.1 g/dL 8.2(H)  Total Bilirubin 0.3 - 1.2 mg/dL 0.5  Alkaline Phos 38 - 126 U/L 64  AST 15 - 41 U/L 20  ALT 14 - 54 U/L 16   CBC Latest Ref Rng & Units 06/28/2017  WBC 3.6 - 11.0 K/uL 4.2  Hemoglobin 12.0 - 16.0 g/dL 13.7  Hematocrit 35.0 - 47.0 % 40.9  Platelets 150 - 440 K/uL 382    No images are attached to the encounter.  Dg Shoulder Left  Result Date: 10/07/2017 CLINICAL DATA:  67 year old female status post fall in August 2018. Increasing anterior shoulder pain and limited range of  motion. EXAM: LEFT SHOULDER - 2+ VIEW COMPARISON:  None. FINDINGS: No glenohumeral joint dislocation. Mild glenohumeral joint space loss for age. Bone mineralization is within normal limits for age. Intact proximal left humerus. The left clavicle in scapula appear intact. Visible chest remarkable for cardiomegaly and tortuosity of the thoracic aorta. IMPRESSION: 1.  No acute osseous abnormality identified.  At the left shoulder. 2. Cardiomegaly suspected. Electronically Signed   By: Genevie Ann M.D.   On: 10/07/2017 16:26     Assessment and plan- Patient is a 67 y.o. 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  On review of patient's prior CBCs her white count was fluctuating between 3.8-4.4 between 20 16-20 18.  She has had her white count checked on 5 different occasions this year and her white count again fluctuates between 2.8-3.8.  Most recent CBC from 10/07/2017 showed a white count of 3.1 which was improved from 2.8 in June 2019.  Differential mainly shows mild neutropenia and her most recent ANC was 1.4.  She does not have any associated anemia or thrombocytopenia.  I suspect that her neutropenia is benign ethnic.  It has been fluctuating but has not been consistently trending down.  This can be monitored conservatively and she does not need a bone marrow biopsy at this time.  If she were to have any B symptoms such as unintentional weight loss fatigue night sweats or recurrent infections she should reach out to [place] and we will consider doing a bone marrow biopsy at that time.  Otherwise she will see me in January with a CBC with differential as scheduled.  2.  I was looking at her prior imaging and will visit patient had a CT abdomen done in May 2018 which incidentally showed a 1.2 cm cystic lesion in the pancreas and a repeat MRI with MRCP was recommended a year later which patient has not done until now.  I will therefore schedule an MRI with MRCP at this time.   We will obtain outside CT abdomen and pelvis images for comparison.  I will see her back in  2 to 3 weeks time to discuss the results of her MRI and further management   Visit Diagnosis 1. Pancreatic cyst   2. Neutropenia, unspecified type [facility])      Dr. Randa Evens, MD, MPH [facility] at [facility] [phone] 10/11/2017 12:18 PM

## 2017-10-22 ENCOUNTER — Ambulatory Visit: Payer: Medicare HMO

## 2017-10-25 ENCOUNTER — Ambulatory Visit: Payer: Medicare HMO | Admitting: Oncology

## 2017-11-01 ENCOUNTER — Other Ambulatory Visit: Payer: Self-pay | Admitting: Oncology

## 2017-11-01 ENCOUNTER — Telehealth: Payer: Self-pay | Admitting: *Deleted

## 2017-11-01 ENCOUNTER — Other Ambulatory Visit: Payer: Self-pay | Admitting: *Deleted

## 2017-11-01 DIAGNOSIS — K862 Cyst of pancreas: Secondary | ICD-10-CM

## 2017-11-01 NOTE — Telephone Encounter (Signed)
Called pt home an d left a message that in order to have MRI tom. She needs to have met done to check her kidney status. I would like her to come in 8:30 tom. To cancer center in [place] for blood work. Will await her call

## 2017-11-02 ENCOUNTER — Ambulatory Visit: Admission: RE | Admit: 2017-11-02 | Payer: Medicare HMO | Source: Ambulatory Visit

## 2017-11-08 ENCOUNTER — Encounter (INDEPENDENT_AMBULATORY_CARE_PROVIDER_SITE_OTHER): Payer: Self-pay

## 2017-11-08 ENCOUNTER — Inpatient Hospital Stay: Payer: Medicare HMO | Admitting: Oncology

## 2017-11-30 ENCOUNTER — Ambulatory Visit
Admission: RE | Admit: 2017-11-30 | Discharge: 2017-11-30 | Disposition: A | Discharge status: Home / Self Care | Payer: Medicare HMO | Source: Ambulatory Visit | Attending: Oncology | Admitting: Oncology

## 2017-11-30 ENCOUNTER — Inpatient Hospital Stay: Payer: Medicare HMO | Attending: Oncology

## 2017-11-30 ENCOUNTER — Other Ambulatory Visit: Payer: Self-pay | Admitting: Oncology

## 2017-11-30 DIAGNOSIS — D709 Neutropenia, unspecified: Secondary | ICD-10-CM | POA: Diagnosis not present

## 2017-11-30 DIAGNOSIS — K760 Fatty (change of) liver, not elsewhere classified: Secondary | ICD-10-CM

## 2017-11-30 DIAGNOSIS — K862 Cyst of pancreas: Secondary | ICD-10-CM

## 2017-11-30 DIAGNOSIS — N281 Cyst of kidney, acquired: Secondary | ICD-10-CM

## 2017-11-30 DIAGNOSIS — E538 Deficiency of other specified B group vitamins: Secondary | ICD-10-CM | POA: Diagnosis not present

## 2017-11-30 LAB — BASIC METABOLIC PANEL
Anion gap: 9 (ref 5–15)
BUN: 16 mg/dL (ref 8–23)
CALCIUM: 9.4 mg/dL (ref 8.9–10.3)
CO2: 29 mmol/L (ref 22–32)
Chloride: 101 mmol/L (ref 98–111)
Creatinine, Ser: 0.69 mg/dL (ref 0.44–1.00)
GFR calc Af Amer: >60 mL/min (ref >60)
Glucose, Bld: 100 mg/dL — ABNORMAL HIGH (ref 70–99)
Potassium: 4.2 mmol/L (ref 3.5–5.1)
SODIUM: 139 mmol/L (ref 135–145)

## 2017-11-30 MED ORDER — GADOBUTROL 1 MMOL/ML IV SOLN
10.0000 mL | Freq: Once | INTRAVENOUS | Status: AC | PRN
  Administered 2017-11-30: 10 mL via INTRAVENOUS

## 2017-12-06 ENCOUNTER — Inpatient Hospital Stay: Payer: Medicare HMO | Attending: Oncology | Admitting: Oncology

## 2017-12-06 ENCOUNTER — Encounter: Payer: Self-pay | Admitting: Oncology

## 2017-12-06 VITALS — BP 139/81 | HR 80 | Temp 97.6°F | Resp 16 | Ht 59.0 in | Wt 228.0 lb

## 2017-12-06 DIAGNOSIS — E119 Type 2 diabetes mellitus without complications: Secondary | ICD-10-CM

## 2017-12-06 DIAGNOSIS — D709 Neutropenia, unspecified: Secondary | ICD-10-CM | POA: Insufficient documentation

## 2017-12-06 DIAGNOSIS — M25562 Pain in left knee: Secondary | ICD-10-CM | POA: Insufficient documentation

## 2017-12-06 DIAGNOSIS — M549 Dorsalgia, unspecified: Secondary | ICD-10-CM | POA: Insufficient documentation

## 2017-12-06 DIAGNOSIS — M25561 Pain in right knee: Secondary | ICD-10-CM | POA: Insufficient documentation

## 2017-12-06 DIAGNOSIS — K862 Cyst of pancreas: Secondary | ICD-10-CM | POA: Diagnosis not present

## 2017-12-06 DIAGNOSIS — R5381 Other malaise: Secondary | ICD-10-CM | POA: Diagnosis not present

## 2017-12-06 DIAGNOSIS — R109 Unspecified abdominal pain: Secondary | ICD-10-CM | POA: Diagnosis not present

## 2017-12-06 DIAGNOSIS — E7800 Pure hypercholesterolemia, unspecified: Secondary | ICD-10-CM | POA: Insufficient documentation

## 2017-12-06 DIAGNOSIS — R5383 Other fatigue: Secondary | ICD-10-CM

## 2017-12-06 DIAGNOSIS — Z7982 Long term (current) use of aspirin: Secondary | ICD-10-CM | POA: Insufficient documentation

## 2017-12-06 DIAGNOSIS — I10 Essential (primary) hypertension: Secondary | ICD-10-CM

## 2017-12-06 DIAGNOSIS — Z79899 Other long term (current) drug therapy: Secondary | ICD-10-CM | POA: Diagnosis not present

## 2017-12-06 DIAGNOSIS — G8929 Other chronic pain: Secondary | ICD-10-CM | POA: Diagnosis not present

## 2017-12-06 DIAGNOSIS — Z7984 Long term (current) use of oral hypoglycemic drugs: Secondary | ICD-10-CM | POA: Diagnosis not present

## 2017-12-06 NOTE — Progress Notes (Signed)
Hematology/Oncology Consult note [facility]  [phone] [phone]  Patient Care Team: Sharyne Peach, MD as PCP - General (Family Medicine)   Name of the patient: Darlene Chen  [id number]  27-Oct-1950   Date of visit: 12/06/17  Diagnosis- 1. mild intermittent neutropenia likely benign 2. Pancreatic cyst  Chief complaint/ Reason for visit- discuss MRI results  Heme/Onc history: patient is a 67 yr old african [place] female referred for neutropenia. Patient had a normal wbc in 2016 at 4.4. Between 2018 and 2019, her wbc has been 3.7-3.8. Most recently on 06/15/17 her wbc was 2.8, H/H 12.7/39 and platelet count of 300. anc was 0.9. In 2018 it was 1.6 and in feb 2019 anc was 1.9. We do not have any other prior counts for comparison. Patient reports occasional sporadic episodes of back pain and abdominal pain which last for a few minutes and then go away. These are far and few. Appetite is good. Denies any unintentional weight loss, drenching night sweats or recurrent infections. Denies any OTC medications or herbal supplements. No recent changes in her medications. She has chronic b/l knee pain for which she gets joint injections. Denies pain or swelling in other joints. No oral ulcers or photophobia. No family h/o neutropenia or lymphoma/ leukemia  Results of bloodwork from 06/28/17 were as follows: cbc showed wbc of 4.2 with normal differential. anc was 2.3. H/H 13.7/40.9 and platelet count 382. Folate level was normal. HIV and hep C testing and hep B testing negative. B12 level borderline low at 257. Peripheral smear review unremarkable  Interval history- she is doing well. Denies any complaints today other than mild fatigue ECOG PS- 0 Pain scale- 0 Opioid associated constipation- no  Review of systems- Review of Systems  Constitutional: Positive for malaise/fatigue. Negative for chills, fever and weight loss.  HENT: Negative  for congestion, ear discharge and nosebleeds.   Eyes: Negative for blurred vision.  Respiratory: Negative for cough, hemoptysis, sputum production, shortness of breath and wheezing.   Cardiovascular: Negative for chest pain, palpitations, orthopnea and claudication.  Gastrointestinal: Negative for abdominal pain, blood in stool, constipation, diarrhea, heartburn, melena, nausea and vomiting.  Genitourinary: Negative for dysuria, flank pain, frequency, hematuria and urgency.  Musculoskeletal: Negative for back pain, joint pain and myalgias.  Skin: Negative for rash.  Neurological: Negative for dizziness, tingling, focal weakness, seizures, weakness and headaches.  Endo/Heme/Allergies: Does not bruise/bleed easily.  Psychiatric/Behavioral: Negative for depression and suicidal ideas. The patient does not have insomnia.       Allergies  Allergen Reactions  . Biaxin [Clarithromycin] Hives  . Niacin Other (See Comments)    Burning from inside   . Statins Other (See Comments)    Muscle/joint pain.  . Sulfa Antibiotics Hives  . Tramadol Other (See Comments)    Unsure of exact reaction type  . Ezetimibe Other (See Comments)    Muscle/joint pain     Past Medical History:  Diagnosis Date  . Diabetes mellitus without complication ([place])   . High cholesterol   . Hypertension   . Neutropenia [facility])      Past Surgical History:  Procedure Laterality Date  . arthroscopic knee surgery  on left 2000    . LEFT HEART CATH AND CORONARY ANGIOGRAPHY N/A 02/09/2017   Procedure: LEFT HEART CATH AND CORONARY ANGIOGRAPHY;  Surgeon: Yolonda Kida, MD;  Location: [place] CV [facility];  Service: Cardiovascular;  Laterality: N/A;  . REDUCTION MAMMAPLASTY Bilateral  [place]  . UMBILICAL HERNIA REPAIR  2007    Social History   Socioeconomic History  . Marital status: Married    Spouse name: Not on file  . Number of children: Not on file  . Years of education: Not on file    . Highest education level: Not on file  Occupational History  . Not on file  Social Needs  . Financial resource strain: Not on file  . Food insecurity:    Worry: Not on file    Inability: Not on file  . Transportation needs:    Medical: Not on file    Non-medical: Not on file  Tobacco Use  . Smoking status: Never Smoker  . Smokeless tobacco: Never Used  Substance and Sexual Activity  . Alcohol use: No  . Drug use: No  . Sexual activity: Not on file  Lifestyle  . Physical activity:    Days per week: Not on file    Minutes per session: Not on file  . Stress: Not on file  Relationships  . Social connections:    Talks on phone: Not on file    Gets together: Not on file    Attends religious service: Not on file    Active member of club or organization: Not on file    Attends meetings of clubs or organizations: Not on file    Relationship status: Not on file  . Intimate partner violence:    Fear of current or ex partner: Not on file    Emotionally abused: Not on file    Physically abused: Not on file    Forced sexual activity: Not on file  Other Topics Concern  . Not on file  Social History Narrative  . Not on file    Family History  Problem Relation Age of Onset  . Breast cancer Sister 83     Current Outpatient Medications:  .  amLODipine (NORVASC) 10 MG tablet, Take 10 mg by mouth daily., Disp: , Rfl:  .  aspirin EC 81 MG tablet, Take 81 mg by mouth daily., Disp: , Rfl:  .  colesevelam (WELCHOL) 625 MG tablet, Take 3,750 mg by mouth daily after supper. , Disp: , Rfl:  .  cycloSPORINE (RESTASIS) 0.05 % ophthalmic emulsion, Place 1 drop into both eyes 2 (two) times daily as needed. , Disp: , Rfl:  .  diclofenac sodium (VOLTAREN) 1 % GEL, Apply 1 application topically as needed., Disp: , Rfl:  .  fluocinonide cream (LIDEX) 8.33 %, Apply 1 application topically 2 (two) times daily as needed. For eczema, Disp: , Rfl: 0 .  hydrochlorothiazide (HYDRODIURIL) 12.5 MG  tablet, Take 12.5 mg by mouth daily., Disp: , Rfl:  .  metFORMIN (GLUCOPHAGE) 500 MG tablet, Take 500 mg by mouth 2 (two) times daily., Disp: , Rfl:  .  metoprolol succinate (TOPROL-XL) 25 MG 24 hr tablet, Take 25 mg by mouth daily., Disp: , Rfl:   Physical exam:  Vitals:   12/06/17 1038  BP: 139/81  Pulse: 80  Resp: 16  Temp: 97.6 F (36.4 C)  TempSrc: Tympanic  Weight: 227 lb 15.3 oz (103.4 kg)  Height: 4\' 11"  (1.499 m)   Physical Exam  Constitutional: She is oriented to person, place, and time. She appears well-developed and well-nourished.  HENT:  Head: Normocephalic and atraumatic.  Eyes: Pupils are equal, round, and reactive to light. EOM are normal.  Neck: Normal range of motion.  Cardiovascular: Normal  rate, regular rhythm and normal heart sounds.  Pulmonary/Chest: Effort normal and breath sounds normal.  Abdominal: Soft. Bowel sounds are normal.  Neurological: She is alert and oriented to person, place, and time.  Skin: Skin is warm and dry.     CMP Latest Ref Rng & Units 11/30/2017  Glucose 70 - 99 mg/dL 100(H)  BUN 8 - 23 mg/dL 16  Creatinine 0.44 - 1.00 mg/dL 0.69  Sodium 135 - 145 mmol/L 139  Potassium 3.5 - 5.1 mmol/L 4.2  Chloride 98 - 111 mmol/L 101  CO2 22 - 32 mmol/L 29  Calcium 8.9 - 10.3 mg/dL 9.4  Total Protein 6.5 - 8.1 g/dL -  Total Bilirubin 0.3 - 1.2 mg/dL -  Alkaline Phos 38 - 126 U/L -  AST 15 - 41 U/L -  ALT 14 - 54 U/L -   CBC Latest Ref Rng & Units 06/28/2017  WBC 3.6 - 11.0 K/uL 4.2  Hemoglobin 12.0 - 16.0 g/dL 13.7  Hematocrit 35.0 - 47.0 % 40.9  Platelets 150 - 440 K/uL 382    No images are attached to the encounter.  Mr 3d Recon At Scanner  Result Date: 11/30/2017 CLINICAL DATA:  Evaluate pancreatic cyst. EXAM: MRI ABDOMEN WITHOUT AND WITH CONTRAST (INCLUDING MRCP) TECHNIQUE: Multiplanar multisequence MR imaging of the abdomen was performed both before and after the administration of intravenous contrast. Heavily T2-weighted  images of the biliary and pancreatic ducts were obtained, and three-dimensional MRCP images were rendered by post processing. CONTRAST:  10 cc MultiHance COMPARISON:  None FINDINGS: Lower chest: No acute findings. Hepatobiliary: Mild hepatic steatosis. Small cysts identified within segment 7 and segment 2 measuring up to 8 mm, image 17/21. Within the posteromedial right lobe of liver there is a simple cyst measuring 1.1 cm, image 9/10. No suspicious enhancing liver abnormalities. Gallbladder appears normal. No gallstones or gallbladder wall thickening. No biliary ductal dilatation. Pancreas: No pancreatic inflammation or main duct dilatation. No enhancing pancreas mass identified. Cyst within the septated cyst arising from the body of pancreas has a maximum dimension of 1.7 cm. No additional focal pancreatic abnormalities identified Spleen:  Within normal limits in size and appearance. Adrenals/Urinary Tract: Normal adrenal glands. Several small simple appearing cysts noted within the right kidney. No enhancing kidney mass or hydronephrosis identified. Stomach/Bowel: Visualized portions within the abdomen are unremarkable. Vascular/Lymphatic: No pathologically enlarged lymph nodes identified. No abdominal aortic aneurysm demonstrated. Other:  None. Musculoskeletal: No suspicious bone lesions identified. IMPRESSION: 1. Septated cyst within the body of pancreas is identified with a maximum dimension of 1.7 cm. Follow-up imaging in 6 months with repeat pancreas protocol MRI is recommended. This recommendation follows ACR consensus guidelines: Management of Incidental Pancreatic Cysts: A White Paper of the ACR Incidental Findings Committee. [place] 2683;41:962-229. 2. Liver and right kidney cysts. 3. Mild hepatic steatosis. Electronically Signed   By: Kerby Moors M.D.   On: 11/30/2017 14:16   Mr Abdomen Mrcp Moise Boring Contast  Result Date: 11/30/2017 CLINICAL DATA:  Evaluate pancreatic cyst. EXAM: MRI  ABDOMEN WITHOUT AND WITH CONTRAST (INCLUDING MRCP) TECHNIQUE: Multiplanar multisequence MR imaging of the abdomen was performed both before and after the administration of intravenous contrast. Heavily T2-weighted images of the biliary and pancreatic ducts were obtained, and three-dimensional MRCP images were rendered by post processing. CONTRAST:  10 cc MultiHance COMPARISON:  None FINDINGS: Lower chest: No acute findings. Hepatobiliary: Mild hepatic steatosis. Small cysts identified within segment 7 and segment 2 measuring up to  8 mm, image 17/21. Within the posteromedial right lobe of liver there is a simple cyst measuring 1.1 cm, image 9/10. No suspicious enhancing liver abnormalities. Gallbladder appears normal. No gallstones or gallbladder wall thickening. No biliary ductal dilatation. Pancreas: No pancreatic inflammation or main duct dilatation. No enhancing pancreas mass identified. Cyst within the septated cyst arising from the body of pancreas has a maximum dimension of 1.7 cm. No additional focal pancreatic abnormalities identified Spleen:  Within normal limits in size and appearance. Adrenals/Urinary Tract: Normal adrenal glands. Several small simple appearing cysts noted within the right kidney. No enhancing kidney mass or hydronephrosis identified. Stomach/Bowel: Visualized portions within the abdomen are unremarkable. Vascular/Lymphatic: No pathologically enlarged lymph nodes identified. No abdominal aortic aneurysm demonstrated. Other:  None. Musculoskeletal: No suspicious bone lesions identified. IMPRESSION: 1. Septated cyst within the body of pancreas is identified with a maximum dimension of 1.7 cm. Follow-up imaging in 6 months with repeat pancreas protocol MRI is recommended. This recommendation follows ACR consensus guidelines: Management of Incidental Pancreatic Cysts: A White Paper of the ACR Incidental Findings Committee. [place] 7903;83:338-329. 2. Liver and right kidney cysts. 3.  Mild hepatic steatosis. Electronically Signed   By: Kerby Moors M.D.   On: 11/30/2017 14:16     Assessment and plan- Patient is a 67 y.o. female with following issues:  1. Pancreatic cyst: mri abdomen images done on 11/30/17 reviewed independently. Patient noted to have 1.7 cyst in the pancreatic bosy with septations. No suspicious calcifications. I will repeat mri abdomen pancreatic mass protocol in 6 months time. Also noted to have fatty liver. lfts normal. Continue to monitor  2. 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 Wbc waxes and wanes between 2.5-4.0. No other cytopenias. Continue to monitor    I will see her back in 6 months with cbc with diff with mri abdomen prior in [place]   Visit Diagnosis 1. Pancreatic cyst   2. Neutropenia, unspecified type [facility])      Dr. Randa Evens, MD, MPH [facility] [phone] 12/06/2017 2:23 PM

## 2017-12-12 DIAGNOSIS — K760 Fatty (change of) liver, not elsewhere classified: Secondary | ICD-10-CM | POA: Insufficient documentation

## 2018-01-17 ENCOUNTER — Ambulatory Visit: Payer: Medicare HMO | Admitting: Oncology

## 2018-01-17 ENCOUNTER — Other Ambulatory Visit: Payer: Medicare HMO

## 2018-02-24 ENCOUNTER — Other Ambulatory Visit: Payer: Self-pay | Admitting: Neuroradiology

## 2018-02-24 DIAGNOSIS — I671 Cerebral aneurysm, nonruptured: Secondary | ICD-10-CM

## 2018-03-08 ENCOUNTER — Ambulatory Visit: Admission: RE | Admit: 2018-03-08 | Payer: Medicare HMO | Source: Ambulatory Visit

## 2018-03-18 ENCOUNTER — Ambulatory Visit
Admission: RE | Admit: 2018-03-18 | Discharge: 2018-03-18 | Disposition: A | Discharge status: Home / Self Care | Payer: Medicare HMO | Source: Ambulatory Visit | Attending: Neuroradiology | Admitting: Neuroradiology

## 2018-03-18 ENCOUNTER — Other Ambulatory Visit: Payer: Self-pay

## 2018-03-18 DIAGNOSIS — I671 Cerebral aneurysm, nonruptured: Secondary | ICD-10-CM | POA: Insufficient documentation

## 2018-05-31 ENCOUNTER — Other Ambulatory Visit: Payer: Medicare HMO

## 2018-05-31 ENCOUNTER — Ambulatory Visit: Payer: Medicare HMO

## 2018-06-03 ENCOUNTER — Ambulatory Visit: Payer: Medicare HMO | Admitting: Oncology

## 2018-06-07 ENCOUNTER — Ambulatory Visit: Payer: Medicare HMO | Admitting: Oncology

## 2018-06-08 ENCOUNTER — Other Ambulatory Visit: Payer: Self-pay | Admitting: *Deleted

## 2018-06-08 DIAGNOSIS — K862 Cyst of pancreas: Secondary | ICD-10-CM

## 2018-06-17 ENCOUNTER — Ambulatory Visit: Payer: Medicare HMO

## 2018-06-21 ENCOUNTER — Other Ambulatory Visit: Payer: Self-pay | Admitting: *Deleted

## 2018-06-21 DIAGNOSIS — D709 Neutropenia, unspecified: Secondary | ICD-10-CM

## 2018-06-28 ENCOUNTER — Other Ambulatory Visit: Payer: Self-pay

## 2018-06-28 ENCOUNTER — Ambulatory Visit: Payer: Medicare HMO

## 2018-06-28 ENCOUNTER — Inpatient Hospital Stay: Payer: Medicare HMO | Attending: Hematology and Oncology

## 2018-06-28 DIAGNOSIS — K862 Cyst of pancreas: Secondary | ICD-10-CM | POA: Diagnosis not present

## 2018-06-28 DIAGNOSIS — D709 Neutropenia, unspecified: Secondary | ICD-10-CM | POA: Diagnosis not present

## 2018-06-28 LAB — CBC WITH DIFFERENTIAL/PLATELET
Abs Immature Granulocytes: 0.01 10*3/uL (ref 0.00–0.07)
Basophils Absolute: 0 10*3/uL (ref 0.0–0.1)
Basophils Relative: 0 %
Eosinophils Absolute: 0 10*3/uL (ref 0.0–0.5)
Eosinophils Relative: 1 %
HCT: 37.2 % (ref 36.0–46.0)
Hemoglobin: 12.5 g/dL (ref 12.0–15.0)
Immature Granulocytes: 0 %
Lymphocytes Relative: 58 %
Lymphs Abs: 1.8 10*3/uL (ref 0.7–4.0)
MCH: 31.9 pg (ref 26.0–34.0)
MCHC: 33.6 g/dL (ref 30.0–36.0)
MCV: 94.9 fL (ref 80.0–100.0)
Monocytes Absolute: 0.3 10*3/uL (ref 0.1–1.0)
Monocytes Relative: 9 %
Neutro Abs: 1 10*3/uL — ABNORMAL LOW (ref 1.7–7.7)
Neutrophils Relative %: 32 %
Platelets: 275 10*3/uL (ref 150–400)
RBC: 3.92 MIL/uL (ref 3.87–5.11)
RDW: 14.5 % (ref 11.5–15.5)
WBC: 3.2 10*3/uL — ABNORMAL LOW (ref 4.0–10.5)
nRBC: 0 % (ref 0.0–0.2)

## 2018-06-30 ENCOUNTER — Ambulatory Visit: Payer: Medicare HMO | Admitting: Oncology

## 2018-06-30 ENCOUNTER — Other Ambulatory Visit: Payer: Self-pay | Admitting: Oncology

## 2018-07-04 ENCOUNTER — Other Ambulatory Visit: Payer: Self-pay

## 2018-07-04 ENCOUNTER — Ambulatory Visit
Admission: RE | Admit: 2018-07-04 | Discharge: 2018-07-04 | Disposition: A | Discharge status: Home / Self Care | Payer: Medicare HMO | Source: Ambulatory Visit | Attending: Oncology | Admitting: Oncology

## 2018-07-04 DIAGNOSIS — K862 Cyst of pancreas: Secondary | ICD-10-CM

## 2018-07-04 MED ORDER — GADOBENATE DIMEGLUMINE 529 MG/ML IV SOLN
20.0000 mL | Freq: Once | INTRAVENOUS | Status: AC | PRN
  Administered 2018-07-04: 20 mL via INTRAVENOUS

## 2018-07-04 NOTE — Progress Notes (Signed)
[facility]  [address], [address] [place], [place] [address] Phone: [phone]  Fax: [phone]   Clinic Day:  07/06/2018  Referring physician: Sharyne Peach, MD  Chief Complaint: Darlene Chen is a 68 y.o. female with neutropenia and a pancreatic cyst who is seen for new patient assessment.  HPI:  The patient was diagnosed with neutropenia in 2018 after labs showed WBC between 3,700-3,800. She was initially seen in the hematology clinic on 06/28/2017 by Dr. Randa Evens.  At that time, labs showed WBC 2,800, ANC 900, hemoglobin 12.7, hematocrit 39.0, platelets 300,000.   Work-up by Dr. Janese Banks on 06/28/2017 showed: WBC 4,200, ANC 2,300, hemoglobin 13.7, hematocrit 40.9, platelets 382,000. B12 was 257. Folate 12.8. HIV negative. Hepatitis C antibody negative, Hepatitis B core antibody total negative. Peripheral smear was unremarkable.   Recommendation was to begin oral B12.  She also has a history of pancreatic lesion, initially seen on a CT abdomen in 05/2016.  MRCP MRI on 11/30/2017 revealed a septated cyst on the body of the pancreas, 1.7cm in maximum dimension. Additionally, there were liver and right kidney cysts, with mild hepatic steatosis. 38-month follow-up imaging was recommended.   She has been on surveillance by Dr. Janese Banks. Notes reviewed.  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    WBC has been followed: 4,400 on 03/12/2014, 3,700 on 06/30/2016, 3,800 on 09/12/2016, 3,800 on 02/05/2017, 3,800 on 02/12/2017, 2,800 on 06/15/2017, 4,200 on 06/28/2017, 3,600 on 09/09/2017, 2,900 on 09/27/2017, 3,100 on 10/07/2017, 2,900 on 05/26/2018, and 3,200 on 06/28/2018.   The patient was last seen in the medical oncology and hematology clinic on 12/06/2017 by Dr. Janese Banks. At that time, she was mildly fatigued. She was otherwise doing well.   Abdominal MRI on 07/04/2018 revealed stable small cystic lesions in the pancreas. A 63-month follow-up was  recommended.   Labs on 06/28/2018: WBC 3,200, ANC 1,000, hemoglobin 12.5, hematocrit 37.2, platelets 275,000.   During the interim, she reports "for an old lady, I feel pretty good." She denies any issues with infections. She denies any fevers or sweats. She has chronic headaches for which she sees a neurologist. She denies any cough, shortness of breath or chest pain. She denies any lumps, bumps, bruising, or bleeding. She has chronic knee pain. She has intermittent fatigue.   She currently takes 500mg  oral B12. It had been stopped previously by her PCP when her B12 was too high.    Past Medical History:  Diagnosis Date  . Diabetes mellitus without complication ([place])   . High cholesterol   . Hypertension   . Neutropenia [facility])     Past Surgical History:  Procedure Laterality Date  . arthroscopic knee surgery  on left 2000    . LEFT HEART CATH AND CORONARY ANGIOGRAPHY N/A 02/09/2017   Procedure: LEFT HEART CATH AND CORONARY ANGIOGRAPHY;  Surgeon: Yolonda Kida, MD;  Location: [place] CV LAB;  Service: Cardiovascular;  Laterality: N/A;  . REDUCTION MAMMAPLASTY Bilateral    1990  . TUBAL LIGATION  1985  . UMBILICAL HERNIA REPAIR  2007    Family History  Problem Relation Age of Onset  . Breast cancer Sister 70    Social History:  reports that she has never smoked. She has never used smokeless tobacco. She reports that she does not drink alcohol or use drugs.She denies any alcohol or tobacco. She denies any know exposure to radiation or toxins. She is not currently working. She lives in [place].  The patient is alone today.  Allergies:  Allergies  Allergen Reactions  . Colesevelam Nausea Only  . Biaxin [Clarithromycin] Hives  . Niacin Other (See Comments)    Burning from inside   . Statins Other (See Comments)    Muscle/joint pain.  . Sulfa Antibiotics Hives  . Sulfur Dioxide   . Tramadol Other (See Comments)    Unsure of exact reaction type  . Ezetimibe Other (See  Comments)    Muscle/joint pain    Current Medications: Current Outpatient Medications  Medication Sig Dispense Refill  . amLODipine (NORVASC) 10 MG tablet Take 10 mg by mouth daily.    Marland Kitchen aspirin EC 81 MG tablet Take 81 mg by mouth daily.    . cycloSPORINE (RESTASIS) 0.05 % ophthalmic emulsion Place 1 drop into both eyes 2 (two) times daily as needed.     . diclofenac sodium (VOLTAREN) 1 % GEL Apply 1 application topically as needed.    . fluocinonide cream (LIDEX) 9.03 % Apply 1 application topically 2 (two) times daily as needed. For eczema  0  . hydrochlorothiazide (HYDRODIURIL) 12.5 MG tablet Take 12.5 mg by mouth daily.    Marland Kitchen LORazepam (ATIVAN) 0.5 MG tablet Take 1 tab 15-20 minutes prior to procedure.    . metFORMIN (GLUCOPHAGE) 500 MG tablet Take 500 mg by mouth 2 (two) times daily.    . metoprolol succinate (TOPROL-XL) 25 MG 24 hr tablet Take 25 mg by mouth daily.     No current facility-administered medications for this visit.     Review of Systems  Constitutional: Positive for malaise/fatigue (intermittent) and weight loss (11 lbs). Negative for chills, diaphoresis and fever.  HENT: Negative.  Negative for congestion, hearing loss, sinus pain and sore throat.   Eyes: Negative.  Negative for blurred vision.  Respiratory: Negative.  Negative for cough, shortness of breath and wheezing.   Cardiovascular: Negative.  Negative for chest pain, palpitations, orthopnea, leg swelling and PND.  Gastrointestinal: Negative.  Negative for abdominal pain, blood in stool, constipation, diarrhea, melena, nausea and vomiting.  Genitourinary: Negative.  Negative for dysuria, frequency, hematuria and urgency.  Musculoskeletal: Positive for joint pain (chronic knee pain). Negative for back pain and myalgias.  Skin: Negative.  Negative for rash.  Neurological: Positive for headaches (chronic). Negative for dizziness, tingling, sensory change and weakness.  Endo/Heme/Allergies: Negative.  Does not  bruise/bleed easily.  Psychiatric/Behavioral: Negative.  Negative for depression, memory loss and substance abuse. The patient is not nervous/anxious and does not have insomnia.   All other systems reviewed and are negative.  Performance status (ECOG):  1  Blood pressure 138/88, pulse 68, temperature 98.2 F (36.8 C), temperature source Tympanic, resp. rate 18, weight 216 lb 4.3 oz (98.1 kg), SpO2 98 %.   Physical Exam  Constitutional: She is oriented to person, place, and time. She appears well-developed and well-nourished. No distress.  HENT:  Head: Normocephalic and atraumatic.  Mouth/Throat: Oropharynx is clear and moist. No oropharyngeal exudate.  Brown graying hair. Mask.   Eyes: Pupils are equal, round, and reactive to light. Conjunctivae and EOM are normal. No scleral icterus.  Glasses.  Brown eyes.  Neck: Normal range of motion. Neck supple. No JVD present.  Cardiovascular: Normal rate, regular rhythm and normal heart sounds.  No murmur heard. Pulmonary/Chest: Effort normal and breath sounds normal. No respiratory distress. She has no wheezes. She has no rales.  Abdominal: Soft. Bowel sounds are normal. She exhibits no distension. There is no abdominal tenderness. There  is no rebound and no guarding.  Musculoskeletal: Normal range of motion.        General: No edema.  Lymphadenopathy:    She has no cervical adenopathy.    She has no axillary adenopathy.       Right: No supraclavicular adenopathy present.       Left: No supraclavicular adenopathy present.  Neurological: She is alert and oriented to person, place, and time.  Skin: Skin is warm and dry. No rash noted. She is not diaphoretic. No erythema. No pallor.  Psychiatric: She has a normal mood and affect. Her behavior is normal. Judgment and thought content normal.  Nursing note and vitals reviewed.   No visits with results within 3 Day(s) from this visit.  Latest known visit with results is:  Appointment on  06/28/2018  Component Date Value Ref Range Status  . WBC 06/28/2018 3.2* 4.0 - 10.5 K/uL Final  . RBC 06/28/2018 3.92  3.87 - 5.11 MIL/uL Final  . Hemoglobin 06/28/2018 12.5  12.0 - 15.0 g/dL Final  . HCT 06/28/2018 37.2  36.0 - 46.0 % Final  . MCV 06/28/2018 94.9  80.0 - 100.0 fL Final  . MCH 06/28/2018 31.9  26.0 - 34.0 pg Final  . MCHC 06/28/2018 33.6  30.0 - 36.0 g/dL Final  . RDW 06/28/2018 14.5  11.5 - 15.5 % Final  . Platelets 06/28/2018 275  150 - 400 K/uL Final  . nRBC 06/28/2018 0.0  0.0 - 0.2 % Final  . Neutrophils Relative % 06/28/2018 32  % Final  . Neutro Abs 06/28/2018 1.0* 1.7 - 7.7 K/uL Final  . Lymphocytes Relative 06/28/2018 58  % Final  . Lymphs Abs 06/28/2018 1.8  0.7 - 4.0 K/uL Final  . Monocytes Relative 06/28/2018 9  % Final  . Monocytes Absolute 06/28/2018 0.3  0.1 - 1.0 K/uL Final  . Eosinophils Relative 06/28/2018 1  % Final  . Eosinophils Absolute 06/28/2018 0.0  0.0 - 0.5 K/uL Final  . Basophils Relative 06/28/2018 0  % Final  . Basophils Absolute 06/28/2018 0.0  0.0 - 0.1 K/uL Final  . Immature Granulocytes 06/28/2018 0  % Final  . 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 WBC ranges between 2800 - 4400 since at least 03/12/2014.  Work-up on 06/28/2017 revealed a WBC 4,200, ANC 2,300, hemoglobin 13.7, hematocrit 40.9, platelets 382,000. B12 was 257 (low).  Normal studies included:  folate (12.8), HIV testing, hepatitis C antibody, hepatitis B core antibody total.  Peripheral smear was unremarkable.   Recommendation was to begin oral B12.  She also has a history of pancreatic lesion initially seen on a abdomen CT in 05/2016.  MRCP MRI on 11/30/2017 revealed a septated cyst on the body of the pancreas, 1.7cm in maximum dimension. Additionally, there were liver  and right kidney cysts, with mild hepatic steatosis. Abdominal MRI on 07/04/2018 revealed stable small cystic lesions in the pancreas.   Symptomatically, she notes intermittent fatigue.  Exam reveals no adenopathy or hepatosplenomegaly.  Plan: 1.   Mild leukopenia  Discuss entire medical history, diagnosis and management of leukopenia.  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   No increased risk of infections.   Set point for circulating neutrophils lower.  Associated with  Duffy null RBC type.   No treatment required.  Leukopenia can be related to B12, folate, copper deficiency, and autoimmune disease.  Peripheral smear revealed no abnormality.  No medications implicated.  Review neutropenic precautions. 2.   B12 deficiency  B12 was 257 on 06/28/2017.  Goal B12 400.  Continue oral B12. 3.   Pancreatic lesion  Review intial imaging and imaging from 07/04/2018.  Images persoanlly reviewed.  Agree with radiology interpretation.  Discuss plan for follow-up MRI in 6 months. 4.   Schedule abdominal MRI/MRCP 01/04/2019. 5.   RTC after MRI for MD assessment, labs (CBC with diff, B12, folate, ANA, copper level, BMP- draw labs prior to MRI).  I discussed the assessment and treatment plan with the patient.  The patient was provided an opportunity to ask questions and all were answered.  The patient agreed with the plan and demonstrated an understanding of the instructions.  The patient was advised to call back if the symptoms worsen or if the condition fails to improve as anticipated.   Lequita Asal, MD, PhD    07/06/2018, 11:25 AM  I, Molly Dorshimer, am acting as Education administrator for [employer]. Mike Gip, MD, PhD.  I,  C. Mike Gip, MD, have reviewed the above documentation for accuracy and completeness, and I agree with the above.

## 2018-07-05 ENCOUNTER — Telehealth: Payer: Self-pay | Admitting: Hematology and Oncology

## 2018-07-05 ENCOUNTER — Other Ambulatory Visit: Payer: Self-pay

## 2018-07-05 NOTE — Telephone Encounter (Signed)
Spoke with pt to confirm appt date/time, do pre-appt screen which was completed, and adv of Covid-19 guidelines for appt regarding screening questions, temperature check, face mask required, and no visitors allowed °

## 2018-07-06 ENCOUNTER — Inpatient Hospital Stay: Payer: Medicare HMO | Attending: Oncology | Admitting: Hematology and Oncology

## 2018-07-06 VITALS — BP 138/88 | HR 68 | Temp 98.2°F | Resp 18 | Wt 216.3 lb

## 2018-07-06 DIAGNOSIS — Z7982 Long term (current) use of aspirin: Secondary | ICD-10-CM | POA: Diagnosis not present

## 2018-07-06 DIAGNOSIS — Z791 Long term (current) use of non-steroidal anti-inflammatories (NSAID): Secondary | ICD-10-CM

## 2018-07-06 DIAGNOSIS — I10 Essential (primary) hypertension: Secondary | ICD-10-CM | POA: Diagnosis not present

## 2018-07-06 DIAGNOSIS — E7800 Pure hypercholesterolemia, unspecified: Secondary | ICD-10-CM | POA: Insufficient documentation

## 2018-07-06 DIAGNOSIS — Z79899 Other long term (current) drug therapy: Secondary | ICD-10-CM | POA: Diagnosis not present

## 2018-07-06 DIAGNOSIS — E119 Type 2 diabetes mellitus without complications: Secondary | ICD-10-CM

## 2018-07-06 DIAGNOSIS — D72819 Decreased white blood cell count, unspecified: Secondary | ICD-10-CM | POA: Insufficient documentation

## 2018-07-06 DIAGNOSIS — K869 Disease of pancreas, unspecified: Secondary | ICD-10-CM

## 2018-07-06 DIAGNOSIS — R51 Headache: Secondary | ICD-10-CM | POA: Insufficient documentation

## 2018-07-06 DIAGNOSIS — R5381 Other malaise: Secondary | ICD-10-CM | POA: Insufficient documentation

## 2018-07-06 DIAGNOSIS — M25569 Pain in unspecified knee: Secondary | ICD-10-CM

## 2018-07-06 DIAGNOSIS — E538 Deficiency of other specified B group vitamins: Secondary | ICD-10-CM | POA: Diagnosis not present

## 2018-07-06 DIAGNOSIS — R5383 Other fatigue: Secondary | ICD-10-CM | POA: Insufficient documentation

## 2018-07-06 DIAGNOSIS — K760 Fatty (change of) liver, not elsewhere classified: Secondary | ICD-10-CM | POA: Insufficient documentation

## 2018-07-06 DIAGNOSIS — G8929 Other chronic pain: Secondary | ICD-10-CM | POA: Insufficient documentation

## 2018-07-06 DIAGNOSIS — D72818 Other decreased white blood cell count: Secondary | ICD-10-CM

## 2018-07-06 NOTE — Progress Notes (Signed)
Pt her for follow up. Previous Dr. Janese Banks Patient. Denies any concerns.

## 2018-07-24 ENCOUNTER — Encounter: Payer: Self-pay | Admitting: Hematology and Oncology

## 2018-07-28 ENCOUNTER — Other Ambulatory Visit: Payer: Self-pay | Admitting: Neurology

## 2018-07-28 DIAGNOSIS — G930 Cerebral cysts: Secondary | ICD-10-CM

## 2018-07-28 DIAGNOSIS — R519 Headache, unspecified: Secondary | ICD-10-CM

## 2018-08-23 ENCOUNTER — Ambulatory Visit
Admission: RE | Admit: 2018-08-23 | Discharge: 2018-08-23 | Disposition: A | Discharge status: Home / Self Care | Payer: Medicare HMO | Source: Ambulatory Visit | Attending: Neurology | Admitting: Neurology

## 2018-08-23 DIAGNOSIS — R519 Headache, unspecified: Secondary | ICD-10-CM

## 2018-08-23 DIAGNOSIS — G930 Cerebral cysts: Secondary | ICD-10-CM

## 2018-08-23 MED ORDER — GADOBENATE DIMEGLUMINE 529 MG/ML IV SOLN
20.0000 mL | Freq: Once | INTRAVENOUS | Status: AC | PRN
  Administered 2018-08-23: 20 mL via INTRAVENOUS

## 2018-08-29 ENCOUNTER — Other Ambulatory Visit: Payer: Self-pay | Admitting: Family Medicine

## 2018-08-29 DIAGNOSIS — Z1231 Encounter for screening mammogram for malignant neoplasm of breast: Secondary | ICD-10-CM

## 2018-08-31 DIAGNOSIS — G4485 Primary stabbing headache: Secondary | ICD-10-CM | POA: Insufficient documentation

## 2018-09-14 ENCOUNTER — Other Ambulatory Visit: Payer: Self-pay

## 2018-09-14 ENCOUNTER — Ambulatory Visit
Admission: RE | Admit: 2018-09-14 | Discharge: 2018-09-14 | Disposition: A | Discharge status: Home / Self Care | Payer: Medicare HMO | Source: Ambulatory Visit | Attending: Family Medicine | Admitting: Family Medicine

## 2018-09-14 DIAGNOSIS — Z1231 Encounter for screening mammogram for malignant neoplasm of breast: Secondary | ICD-10-CM | POA: Diagnosis not present

## 2018-09-21 ENCOUNTER — Inpatient Hospital Stay: Payer: Medicare HMO | Admitting: Hematology and Oncology

## 2018-09-21 ENCOUNTER — Other Ambulatory Visit: Payer: Self-pay | Admitting: Family Medicine

## 2018-09-21 DIAGNOSIS — Z780 Asymptomatic menopausal state: Secondary | ICD-10-CM

## 2018-09-21 DIAGNOSIS — D709 Neutropenia, unspecified: Secondary | ICD-10-CM | POA: Insufficient documentation

## 2018-09-28 ENCOUNTER — Other Ambulatory Visit: Payer: Self-pay

## 2018-09-28 ENCOUNTER — Ambulatory Visit
Admission: RE | Admit: 2018-09-28 | Discharge: 2018-09-28 | Disposition: A | Discharge status: Home / Self Care | Payer: Medicare HMO | Source: Ambulatory Visit | Attending: Family Medicine | Admitting: Family Medicine

## 2018-09-28 DIAGNOSIS — Z780 Asymptomatic menopausal state: Secondary | ICD-10-CM | POA: Insufficient documentation

## 2018-11-10 ENCOUNTER — Inpatient Hospital Stay: Payer: Medicare HMO | Attending: Hematology and Oncology

## 2018-11-10 ENCOUNTER — Other Ambulatory Visit: Payer: Self-pay

## 2018-11-10 DIAGNOSIS — Z79899 Other long term (current) drug therapy: Secondary | ICD-10-CM | POA: Diagnosis not present

## 2018-11-10 DIAGNOSIS — K862 Cyst of pancreas: Secondary | ICD-10-CM | POA: Diagnosis not present

## 2018-11-10 DIAGNOSIS — I10 Essential (primary) hypertension: Secondary | ICD-10-CM | POA: Diagnosis not present

## 2018-11-10 DIAGNOSIS — D72818 Other decreased white blood cell count: Secondary | ICD-10-CM

## 2018-11-10 DIAGNOSIS — R634 Abnormal weight loss: Secondary | ICD-10-CM | POA: Diagnosis not present

## 2018-11-10 DIAGNOSIS — R5383 Other fatigue: Secondary | ICD-10-CM | POA: Insufficient documentation

## 2018-11-10 DIAGNOSIS — E7800 Pure hypercholesterolemia, unspecified: Secondary | ICD-10-CM | POA: Insufficient documentation

## 2018-11-10 DIAGNOSIS — D72819 Decreased white blood cell count, unspecified: Secondary | ICD-10-CM | POA: Diagnosis not present

## 2018-11-10 DIAGNOSIS — Z7982 Long term (current) use of aspirin: Secondary | ICD-10-CM | POA: Diagnosis not present

## 2018-11-10 DIAGNOSIS — Z7984 Long term (current) use of oral hypoglycemic drugs: Secondary | ICD-10-CM | POA: Diagnosis not present

## 2018-11-10 DIAGNOSIS — E538 Deficiency of other specified B group vitamins: Secondary | ICD-10-CM | POA: Diagnosis not present

## 2018-11-10 DIAGNOSIS — E119 Type 2 diabetes mellitus without complications: Secondary | ICD-10-CM | POA: Insufficient documentation

## 2018-11-10 DIAGNOSIS — M1990 Unspecified osteoarthritis, unspecified site: Secondary | ICD-10-CM | POA: Diagnosis not present

## 2018-11-10 DIAGNOSIS — R519 Headache, unspecified: Secondary | ICD-10-CM | POA: Insufficient documentation

## 2018-11-10 DIAGNOSIS — M8580 Other specified disorders of bone density and structure, unspecified site: Secondary | ICD-10-CM | POA: Diagnosis not present

## 2018-11-10 LAB — CBC WITH DIFFERENTIAL/PLATELET
Abs Immature Granulocytes: 0 10*3/uL (ref 0.00–0.07)
Basophils Absolute: 0 10*3/uL (ref 0.0–0.1)
Basophils Relative: 0 %
Eosinophils Absolute: 0 10*3/uL (ref 0.0–0.5)
Eosinophils Relative: 1 %
HCT: 39.5 % (ref 36.0–46.0)
Hemoglobin: 13 g/dL (ref 12.0–15.0)
Immature Granulocytes: 0 %
Lymphocytes Relative: 52 %
Lymphs Abs: 1.5 10*3/uL (ref 0.7–4.0)
MCH: 31 pg (ref 26.0–34.0)
MCHC: 32.9 g/dL (ref 30.0–36.0)
MCV: 94.3 fL (ref 80.0–100.0)
Monocytes Absolute: 0.3 10*3/uL (ref 0.1–1.0)
Monocytes Relative: 9 %
Neutro Abs: 1.1 10*3/uL — ABNORMAL LOW (ref 1.7–7.7)
Neutrophils Relative %: 38 %
Platelets: 336 10*3/uL (ref 150–400)
RBC: 4.19 MIL/uL (ref 3.87–5.11)
RDW: 14.9 % (ref 11.5–15.5)
WBC: 2.8 10*3/uL — ABNORMAL LOW (ref 4.0–10.5)
nRBC: 0 % (ref 0.0–0.2)

## 2018-11-10 LAB — BASIC METABOLIC PANEL
Anion gap: 10 (ref 5–15)
BUN: 16 mg/dL (ref 8–23)
CO2: 24 mmol/L (ref 22–32)
Calcium: 9.1 mg/dL (ref 8.9–10.3)
Chloride: 103 mmol/L (ref 98–111)
Creatinine, Ser: 0.66 mg/dL (ref 0.44–1.00)
GFR calc Af Amer: >60 mL/min (ref >60)
GFR calc non Af Amer: >60 mL/min (ref >60)
Glucose, Bld: 109 mg/dL — ABNORMAL HIGH (ref 70–99)
Potassium: 3.7 mmol/L (ref 3.5–5.1)
Sodium: 137 mmol/L (ref 135–145)

## 2018-11-10 LAB — VITAMIN B12: Vitamin B-12: 549 pg/mL (ref 180–914)

## 2018-11-10 LAB — FOLATE: Folate: 10.2 ng/mL (ref >5.9)

## 2018-11-11 LAB — ANA W/REFLEX: Anti Nuclear Antibody (ANA): NEGATIVE

## 2018-11-12 LAB — COPPER, SERUM: Copper: 122 ug/dL (ref 72–166)

## 2018-11-14 ENCOUNTER — Other Ambulatory Visit: Payer: Self-pay

## 2018-11-14 ENCOUNTER — Ambulatory Visit
Admission: RE | Admit: 2018-11-14 | Discharge: 2018-11-14 | Disposition: A | Discharge status: Home / Self Care | Payer: Medicare HMO | Source: Ambulatory Visit | Attending: Hematology and Oncology | Admitting: Hematology and Oncology

## 2018-11-14 DIAGNOSIS — K869 Disease of pancreas, unspecified: Secondary | ICD-10-CM

## 2018-11-14 MED ORDER — GADOBENATE DIMEGLUMINE 529 MG/ML IV SOLN
20.0000 mL | Freq: Once | INTRAVENOUS | Status: AC | PRN
  Administered 2018-11-14: 20 mL via INTRAVENOUS

## 2018-11-20 NOTE — Progress Notes (Signed)
[facility]  [address] [place],  [address] Phone: [phone]  Fax: [phone]   Clinic Day:  11/21/2018  Referring physician: Sharyne Peach, MD  Chief Complaint: Darlene Chen is a 68 y.o. female with constitutional leukopenia and pancreatic cystic lesions who is seen for 4 month assessment.  HPI: The patient was last seen in the hematology clinic on 07/06/2018. At that time, she noted intermittent fatigue.  Exam revealed no adenopathy or hepatosplenomegaly.  Hematocrit was 37.2, hemoglobin 12.5, MCV 94.9, platelets 275,000, WBC 3200 with an ANC of 1000.  She was on oral B12.  She was seen by Dr Holland Commons, neurologist at [facility], for headaches.  She was referred to the headache clinic.  She saw Dennie Bible, PA at [facility] for headache disorder on 07/20/2018.  She has a family history of aneurysms.  Head MRI on 08/23/2018 revealed no acute abnormality.  There were white matter changes likely due to chronic microvascular ischemia.  Bilateral mammogram on 09/14/2018 showed no evidence of malignancy.  Bone density on 09/28/2018 revealed osteopenia with a T-score of -2.4 in the AP spine L2-L4.  Abdominal MRI/MRCP on 11/14/2018 that revealed stable small pancreatic cystic lesions, largest in the pancreatic body measuring 1.7 cm.  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 Recommendation included continued follow-up by MRI in 6 months.   Labs on 11/10/2018 revealed hematocrit 39.5, hemoglobin 13.0, MCV 94.3, platelets 336,000, WBC 2800, ANC 1100. B12 was 549.  Folate was 10.2.  Copper was 122.  ANA was negative  During the interim, she has been doing fine. She denies any gingivitis or infections. She is taking her oral B-12; she is unsure of dosage. 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 She has stabbing "ice pick" feeling, which comes and goes quickly.   Headaches had leveled off; they are starting to come back again.  She feel pain deep inside her head. She denies any skull pain.  Symptomatically, she feel fine.  Her energy fluctuates. She attributes weight loss to Metformin and Welchol.  Since initiation of both, her appetite has decreased.  She sees changes in her bowels.  She doesn't eat as much. She has no abdominal pain or diarrhea. She weighs her self daily; her goal weight is 170.    Past Medical History:  Diagnosis Date  . Diabetes mellitus without complication ([place])   . High cholesterol   . Hypertension   . Neutropenia [facility])     Past Surgical History:  Procedure Laterality Date  . arthroscopic knee surgery  on left 2000    . BREAST BIOPSY Right 2019   bx/clip-neg  . LEFT HEART CATH AND CORONARY ANGIOGRAPHY N/A 02/09/2017   Procedure: LEFT HEART CATH AND CORONARY ANGIOGRAPHY;  Surgeon: Yolonda Kida, MD;  Location: [facility];  Service: Cardiovascular;  Laterality: N/A;  . REDUCTION MAMMAPLASTY Bilateral    1990  . TUBAL LIGATION  1985  . UMBILICAL HERNIA REPAIR  2007    Family History  Problem Relation Age of Onset  . Breast cancer Sister 45    Social History:  reports that she has never smoked. She has never used smokeless tobacco. She reports that she does not drink alcohol or use drugs. does not drink alcohol or use drugs.She denies any alcohol or tobacco. She denies any know exposure to radiation or toxins. She is not currently working.  She  lives in [place]. The patient is alone today.  Allergies:  Allergies  Allergen Reactions  . Colesevelam Nausea Only  . Biaxin [Clarithromycin] Hives  . Niacin Other (See Comments)    Burning from inside   . Statins Other (See Comments)    Muscle/joint pain.  . Sulfa Antibiotics Hives  . Sulfur Dioxide   . Tramadol Other (See Comments)    Unsure of exact reaction type  . Ezetimibe Other (See Comments)    Muscle/joint pain    Current Medications: Current  Outpatient Medications  Medication Sig Dispense Refill  . amLODipine (NORVASC) 10 MG tablet Take 10 mg by mouth daily.    Marland Kitchen aspirin EC 81 MG tablet Take 81 mg by mouth daily.    . cycloSPORINE (RESTASIS) 0.05 % ophthalmic emulsion Place 1 drop into both eyes 2 (two) times daily as needed.     . hydrochlorothiazide (HYDRODIURIL) 12.5 MG tablet Take 12.5 mg by mouth daily.    . Melatonin (MELATONIN MAXIMUM STRENGTH) 5 MG TABS Take 2 tab qhs    . metFORMIN (GLUCOPHAGE) 500 MG tablet Take 500 mg by mouth 2 (two) times daily.    . metoprolol succinate (TOPROL-XL) 25 MG 24 hr tablet Take 25 mg by mouth daily.    . diclofenac sodium (VOLTAREN) 1 % GEL Apply 1 application topically as needed.    . fluocinonide cream (LIDEX) [id number] % Apply 1 application topically 2 (two) times daily as needed. For eczema  0  . LORazepam (ATIVAN) 0.5 MG tablet Take 1 tab 15-20 minutes prior to procedure.     No current facility-administered medications for this visit.     Review of Systems  Constitutional: Positive for malaise/fatigue (intermittent) and weight loss (reported weight down 11 lbs; goal weight 170 pounds). Negative for chills, diaphoresis and fever.       Feels "well".  HENT: Negative.  Negative for congestion, ear pain, hearing loss, nosebleeds, sinus pain and sore throat.   Eyes: Negative.  Negative for blurred vision, double vision and photophobia.  Respiratory: Negative.  Negative for cough, sputum production, shortness of breath and wheezing.   Cardiovascular: Negative.  Negative for chest pain, palpitations, orthopnea, leg swelling and PND.  Gastrointestinal: Negative for abdominal pain, blood in stool, constipation, diarrhea, heartburn, melena, nausea and vomiting.       Change in appetite and bowels on Metformin and Welchol.  Genitourinary: Negative.  Negative for dysuria, frequency, hematuria and urgency.  Musculoskeletal: Positive for joint pain (chronic knee pain, arthritis in hands). Negative  for back pain, myalgias and neck pain.  Skin: Negative.  Negative for rash.  Neurological: Positive for headaches (chronic- see HPI). Negative for dizziness, tingling, sensory change, speech change, focal weakness and weakness.  Endo/Heme/Allergies: Negative.  Does not bruise/bleed easily.  Psychiatric/Behavioral: Negative.  Negative for depression and memory loss. The patient is not nervous/anxious and does not have insomnia.   All other systems reviewed and are negative.  Performance status (ECOG):  1  Vitals Blood pressure 121/67, pulse (!) 57, temperature 97.9 F (36.6 C), temperature source Tympanic, resp. rate 18, height 4\' 11"  (1.499 m), weight 205 lb (93 kg), SpO2 98 %.   Physical Exam  Constitutional: She is oriented to person, place, and time. She appears well-developed and well-nourished. No distress.  HENT:  Head: Normocephalic and atraumatic.  Mouth/Throat: Oropharynx is clear and moist. No oropharyngeal exudate.  Brown graying hair.  Headband.  Mask.   Eyes: Pupils are equal, round, and reactive to light.  Conjunctivae and EOM are normal. No scleral icterus.  Brown eyes.  Neck: Normal range of motion. Neck supple. No JVD present.  Cardiovascular: Normal rate, regular rhythm and normal heart sounds.  No murmur heard. Pulmonary/Chest: Effort normal and breath sounds normal. No respiratory distress. She has no wheezes. She has no rales.  Abdominal: Soft. Bowel sounds are normal. She exhibits no distension and no mass. There is no abdominal tenderness. There is no rebound and no guarding.  Musculoskeletal: Normal range of motion.        General: No edema.     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  Lymphadenopathy:       Head (right side): No preauricular, no posterior auricular and no occipital adenopathy present.       Head (left side): No preauricular, no posterior auricular and no occipital adenopathy present.    She has no cervical adenopathy.    She has no axillary  adenopathy.       Right: No inguinal and no supraclavicular adenopathy present.       Left: No inguinal and no supraclavicular adenopathy present.  Neurological: She is alert and oriented to person, place, and time.  Skin: Skin is warm and dry. No rash noted. She is not diaphoretic. No erythema. No pallor.  Psychiatric: She has a normal mood and affect. Her behavior is normal. Judgment and thought content normal.  Nursing note and vitals reviewed.   No visits with results within 3 Day(s) from this visit.  Latest known visit with results is:  Appointment on 11/10/2018  Component Date Value Ref Range Status  . Sodium 11/10/2018 137  135 - 145 mmol/L Final  . Potassium 11/10/2018 3.7  3.5 - 5.1 mmol/L Final  . Chloride 11/10/2018 103  98 - 111 mmol/L Final  . CO2 11/10/2018 24  22 - 32 mmol/L Final  . Glucose, Bld 11/10/2018 109* 70 - 99 mg/dL Final  . BUN 11/10/2018 16  8 - 23 mg/dL Final  . Creatinine, Ser 11/10/2018 0.66  0.44 - 1.00 mg/dL Final  . Calcium 11/10/2018 9.1  8.9 - 10.3 mg/dL Final  . GFR calc non Af Amer 11/10/2018 >60  >60 mL/min Final  . GFR calc Af Amer 11/10/2018 >60  >60 mL/min Final  . Anion gap 11/10/2018 10  5 - 15 Final   Performed at [facility], [address] [place], [place] [address]  . Copper 11/10/2018 122  72 - 166 ug/dL Final   Comment: (NOTE) This test was developed and its performance characteristics determined by [facility]. It has not been cleared or approved by the Food and Drug Administration.                                Detection Limit = 5 Performed At: [facility] [place], [place] [id number] Rush Farmer MD [id number]   . Anti Nuclear Antibody (ANA) 11/10/2018 Negative  Negative Final   Comment: (NOTE) Performed At: [facility] [place], [place] [id number] Rush Farmer MD [id number]   . Folate 11/10/2018 10.2  >5.9 ng/mL Final   Performed at [facility],  [address] [place], [place] [address]  . Vitamin B-12 11/10/2018 549  180 - 914 pg/mL Final   Comment: (NOTE) This assay is not validated for testing neonatal or myeloproliferative syndrome specimens for Vitamin B12 levels. Performed at [facility], [place]  [address] [place], [place] [address]   . WBC 11/10/2018 2.8* 4.0 - 10.5 K/uL Final  . RBC 11/10/2018 4.19  3.87 - 5.11 MIL/uL Final  . Hemoglobin 11/10/2018 13.0  12.0 - 15.0 g/dL Final  . HCT 11/10/2018 39.5  36.0 - 46.0 % Final  . MCV 11/10/2018 94.3  80.0 - 100.0 fL Final  . MCH 11/10/2018 31.0  26.0 - 34.0 pg Final  . MCHC 11/10/2018 32.9  30.0 - 36.0 g/dL Final  . RDW 11/10/2018 14.9  11.5 - 15.5 % Final  . Platelets 11/10/2018 336  150 - 400 K/uL Final  . nRBC 11/10/2018 0.0  0.0 - 0.2 % Final  . Neutrophils Relative % 11/10/2018 38  % Final  . Neutro Abs 11/10/2018 1.1* 1.7 - 7.7 K/uL Final  . Lymphocytes Relative 11/10/2018 52  % Final  . Lymphs Abs 11/10/2018 1.5  0.7 - 4.0 K/uL Final  . Monocytes Relative 11/10/2018 9  % Final  . Monocytes Absolute 11/10/2018 0.3  0.1 - 1.0 K/uL Final  . Eosinophils Relative 11/10/2018 1  % Final  . Eosinophils Absolute 11/10/2018 0.0  0.0 - 0.5 K/uL Final  . Basophils Relative 11/10/2018 0  % Final  . Basophils Absolute 11/10/2018 0.0  0.0 - 0.1 K/uL Final  . Immature Granulocytes 11/10/2018 0  % Final  . 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 WBC ranges between 2800 - 4400 since at least 03/12/2014.  Work-up on 06/28/2017 revealed a WBC 4,200, ANC 2,300, hemoglobin 13.7, hematocrit 40.9, platelets 382,000. B12 was 257 (low).  Normal studies included:  folate (12.8), HIV testing, hepatitis C antibody, hepatitis B core antibody total.  Peripheral smear was unremarkable.   Normal labs on 11/10/2018: folate (10.2), copper (122), and ANA.  She also has a history of pancreatic lesion initially seen on a abdomen CT in 05/2016.  MRCP MRI on 11/30/2017 revealed a septated cyst on the body of the pancreas, 1.7cm in maximum dimension. Additionally, there were liver and right kidney cysts, with mild hepatic steatosis. Abdominal MRI on 07/04/2018 revealed stable small cystic lesions in the pancreas.  MRCP MRI on 11/14/2018 revealed stable small pancreatic cystic lesions, largest in the pancreatic body measuring 1.7 cm.  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 Recommendation included continued follow-up by MRI in 6 months.   She has B12 deficiency.  B12 was 257 on 06/28/2017 and 549 on 11/10/2018.  She is on oral B12.  Symptomatically, she feels "well".  She has lost weight felt; she notes change in appetite and bowels on Metformin and Welchol.  Exam is unremarkable.  Plan: 1.   Review labs from 11/10/2018. 2.   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                         No increased risk of infections.                         Set point  for circulating neutrophils lower.  Associated with Duffy null RBC type.                         No treatment required.             Review labs from last visit   Normal labs included: folate, copper and ANA.              Peripheral smear revealed no abnormality.             No medications are implicated.             Continue neutropenic precautions. 3.   B12 deficiency             B12 was 257 on 06/28/2017 and 549 on 11/10/2018.    B12 goal is 400.             Continue oral B12. 4.   Pancreatic lesion             Review interval abdominal MRI.              Pancreatic cystic lesions are stable.             Follow-up MRI in 6 months. 5.   Abdomen MRI on 05/15/2019. 6.   RTC after MRI  for MD assessment, labs (CBC with diff, CMP, [id number]- can be drawn before MRI), and review of MRI.  I discussed the assessment and treatment plan with the patient.  The patient was provided an opportunity to ask questions and all were answered.  The patient agreed with the plan and demonstrated an understanding of the instructions.  The patient was advised to call back if the symptoms worsen or if the condition fails to improve as anticipated.  I provided 16 minutes (11:49 AM - 12:05 PM) of face-to-face time during this this encounter and > 50% was spent counseling as documented under my assessment and plan.    Lequita Asal, MD, PhD    11/21/2018, 12:05 PM  I, Samul Dada, am acting as a scribe for Lequita Asal, MD.  I, Loretto Mike Gip, MD, have reviewed the above documentation for accuracy and completeness, and I agree with the above.

## 2018-11-21 ENCOUNTER — Other Ambulatory Visit: Payer: Self-pay

## 2018-11-21 ENCOUNTER — Encounter: Payer: Self-pay | Admitting: Hematology and Oncology

## 2018-11-21 ENCOUNTER — Inpatient Hospital Stay (HOSPITAL_BASED_OUTPATIENT_CLINIC_OR_DEPARTMENT_OTHER): Payer: Medicare HMO | Admitting: Hematology and Oncology

## 2018-11-21 VITALS — BP 121/67 | HR 57 | Temp 97.9°F | Resp 18 | Ht 59.0 in | Wt 205.0 lb

## 2018-11-21 DIAGNOSIS — E538 Deficiency of other specified B group vitamins: Secondary | ICD-10-CM | POA: Diagnosis not present

## 2018-11-21 DIAGNOSIS — D72819 Decreased white blood cell count, unspecified: Secondary | ICD-10-CM | POA: Diagnosis not present

## 2018-11-21 DIAGNOSIS — K862 Cyst of pancreas: Secondary | ICD-10-CM | POA: Diagnosis not present

## 2018-11-21 DIAGNOSIS — D72818 Other decreased white blood cell count: Secondary | ICD-10-CM | POA: Diagnosis not present

## 2018-11-21 NOTE — Progress Notes (Signed)
No new changes noted today 

## 2019-05-16 ENCOUNTER — Other Ambulatory Visit: Payer: Self-pay

## 2019-05-16 ENCOUNTER — Inpatient Hospital Stay: Payer: Medicare HMO | Attending: Hematology and Oncology

## 2019-05-16 DIAGNOSIS — D378 Neoplasm of uncertain behavior of other specified digestive organs: Secondary | ICD-10-CM | POA: Diagnosis not present

## 2019-05-16 DIAGNOSIS — K862 Cyst of pancreas: Secondary | ICD-10-CM

## 2019-05-16 DIAGNOSIS — E538 Deficiency of other specified B group vitamins: Secondary | ICD-10-CM | POA: Insufficient documentation

## 2019-05-16 DIAGNOSIS — D72818 Other decreased white blood cell count: Secondary | ICD-10-CM

## 2019-05-16 LAB — CBC WITH DIFFERENTIAL/PLATELET
Abs Immature Granulocytes: 0.01 10*3/uL (ref 0.00–0.07)
Basophils Absolute: 0 10*3/uL (ref 0.0–0.1)
Basophils Relative: 0 %
Eosinophils Absolute: 0.1 10*3/uL (ref 0.0–0.5)
Eosinophils Relative: 2 %
HCT: 38.9 % (ref 36.0–46.0)
Hemoglobin: 12.7 g/dL (ref 12.0–15.0)
Immature Granulocytes: 0 %
Lymphocytes Relative: 48 %
Lymphs Abs: 1.3 10*3/uL (ref 0.7–4.0)
MCH: 31.8 pg (ref 26.0–34.0)
MCHC: 32.6 g/dL (ref 30.0–36.0)
MCV: 97.3 fL (ref 80.0–100.0)
Monocytes Absolute: 0.3 10*3/uL (ref 0.1–1.0)
Monocytes Relative: 10 %
Neutro Abs: 1.1 10*3/uL — ABNORMAL LOW (ref 1.7–7.7)
Neutrophils Relative %: 40 %
Platelets: 330 10*3/uL (ref 150–400)
RBC: 4 MIL/uL (ref 3.87–5.11)
RDW: 15.2 % (ref 11.5–15.5)
WBC: 2.8 10*3/uL — ABNORMAL LOW (ref 4.0–10.5)
nRBC: 0 % (ref 0.0–0.2)

## 2019-05-16 LAB — COMPREHENSIVE METABOLIC PANEL
ALT: 16 U/L (ref 0–44)
AST: 19 U/L (ref 15–41)
Albumin: 3.9 g/dL (ref 3.5–5.0)
Alkaline Phosphatase: 68 U/L (ref 38–126)
Anion gap: 8 (ref 5–15)
BUN: 14 mg/dL (ref 8–23)
CO2: 28 mmol/L (ref 22–32)
Calcium: 9 mg/dL (ref 8.9–10.3)
Chloride: 102 mmol/L (ref 98–111)
Creatinine, Ser: 0.66 mg/dL (ref 0.44–1.00)
GFR calc Af Amer: >60 mL/min (ref >60)
GFR calc non Af Amer: >60 mL/min (ref >60)
Glucose, Bld: 98 mg/dL (ref 70–99)
Potassium: 3.8 mmol/L (ref 3.5–5.1)
Sodium: 138 mmol/L (ref 135–145)
Total Bilirubin: 0.8 mg/dL (ref 0.3–1.2)
Total Protein: 7.9 g/dL (ref 6.5–8.1)

## 2019-05-17 LAB — CANCER ANTIGEN 19-9: CA 19-9: 52 U/mL — ABNORMAL HIGH (ref 0–35)

## 2019-05-18 ENCOUNTER — Inpatient Hospital Stay: Payer: Medicare HMO

## 2019-05-19 NOTE — Progress Notes (Signed)
[facility]  [address] [place], [place] [address] Phone: [phone]  Fax: [phone]   Clinic Day:  05/24/2019  Referring physician: Sharyne Peach, MD  Chief Complaint: Darlene Chen is a 69 y.o. female with constitutional leukopenia and pancreatic cystic lesions who is seen for 6 month assessment.  HPI: The patient was last seen in the hematology clinic on 11/21/2018.  At that time, she felt "well".  She had lost weight; she noted change in appetite and bowels on Metformin and Welchol. Exam was unremarkable.  Hematocrit was 39.5, hemoglobin 13.0, MCV 94.3, platelets 336,000, WBC 2800 with an ANC 1100.  B12 was 549 and folate 10.2.  Copper was 122.      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 Continued annual follow-up was suggested.   During the interim, she has been 'well'. She says nothing has changed within the last six months. She no longer feels fatigued as much. She still takes Metformin. 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 Her headaches are still present. She describes the pain as all over her head. She notes she sees Dr. Iona Beard every 3-6 months. She denies any pancreatic pain. She stopped taking B-12 vitamins but agrees to begin taking it again.    Past Medical History:  Diagnosis Date  . Diabetes mellitus without complication ([facility])   . High cholesterol   . Hypertension   . Neutropenia [facility])     Past Surgical History:  Procedure Laterality Date  . arthroscopic knee surgery  on left 2000    . BREAST BIOPSY Right 2019   bx/clip-neg  . LEFT HEART CATH AND CORONARY ANGIOGRAPHY N/A 02/09/2017   Procedure: LEFT HEART CATH AND CORONARY ANGIOGRAPHY;  Surgeon: Yolonda Kida, MD;  Location: [facility];  Service: Cardiovascular;  Laterality: N/A;  . REDUCTION  MAMMAPLASTY Bilateral    1990  . TUBAL LIGATION  1985  . UMBILICAL HERNIA REPAIR  2007    Family History  Problem Relation Age of Onset  . Breast cancer Sister 44    Social History:  reports that she has never smoked. She has never used smokeless tobacco. She reports that she does not drink alcohol or use drugs. does not drink alcohol or use drugs.She denies any alcohol or tobacco. She denies any know exposure to radiation or toxins. She is not currently working.  She lives in [place]. The patient is alone  today.   Allergies:  Allergies  Allergen Reactions  . Colesevelam Nausea Only  . Biaxin [Clarithromycin] Hives  . Niacin Other (See Comments)    Burning from inside   . Statins Other (See Comments)    Muscle/joint pain.  . Sulfa Antibiotics Hives  . Sulfur Dioxide   . Tramadol Other (See Comments)    Unsure of exact reaction type  . Ezetimibe Other (See Comments)    Muscle/joint pain    Current Medications: Current Outpatient Medications  Medication Sig Dispense Refill  . amLODipine (NORVASC) 10 MG tablet Take 10 mg by mouth daily.    Marland Kitchen aspirin EC 81 MG tablet Take 81 mg by mouth daily.    . cycloSPORINE (RESTASIS) 0.05 % ophthalmic emulsion Place 1 drop into both eyes 2 (two) times daily as needed.     . diclofenac sodium (VOLTAREN) 1 % GEL Apply 1 application topically as needed.    Marland Kitchen  fluocinonide cream (LIDEX) [id number] % Apply 1 application topically 2 (two) times daily as needed. For eczema  0  . hydrochlorothiazide (HYDRODIURIL) 12.5 MG tablet Take 12.5 mg by mouth daily.    Marland Kitchen LORazepam (ATIVAN) 0.5 MG tablet Take 1 tab 15-20 minutes prior to procedure.    . Melatonin (MELATONIN MAXIMUM STRENGTH) 5 MG TABS Take 2 tab qhs    . metFORMIN (GLUCOPHAGE) 500 MG tablet Take 500 mg by mouth 2 (two) times daily.    . metoprolol succinate (TOPROL-XL) 25 MG 24 hr tablet Take 25 mg by mouth daily.     No current facility-administered medications for this visit.    Review of  Systems  Constitutional: Negative for chills, diaphoresis, fever, malaise/fatigue (intermittent) and weight loss.       Feels "well".  HENT: Negative.  Negative for congestion, ear pain, hearing loss, nosebleeds, sinus pain, sore throat and tinnitus.   Eyes: Negative.  Negative for blurred vision, double vision and photophobia.  Respiratory: Negative.  Negative for cough, sputum production, shortness of breath and wheezing.   Cardiovascular: Negative.  Negative for chest pain, palpitations, orthopnea, leg swelling and PND.  Gastrointestinal: Negative for abdominal pain, blood in stool, constipation, diarrhea, heartburn, melena, nausea and vomiting.       Change in appetite and bowels on Metformin and Welchol.  Genitourinary: Negative.  Negative for dysuria, flank pain, frequency, hematuria and urgency.  Musculoskeletal: Positive for joint pain (chronic knee pain, arthritis in hands). Negative for back pain, falls, myalgias and neck pain.  Skin: Negative.  Negative for itching and rash.  Neurological: Positive for headaches (chronic- see HPI). Negative for dizziness, tingling, speech change, focal weakness, seizures and weakness.  Endo/Heme/Allergies: Negative.  Negative for environmental allergies. Does not bruise/bleed easily.  Psychiatric/Behavioral: Negative.  Negative for depression and memory loss. The patient is not nervous/anxious and does not have insomnia.   All other systems reviewed and are negative.  Performance status (ECOG): 1 - Symptomatic but completely ambulatory  Vitals Blood pressure (!) 141/76, pulse 64, temperature (!) 97.5 F (36.4 C), temperature source Tympanic, weight 215 lb 8 oz (97.7 kg), SpO2 98 %.   Physical Exam  Constitutional: She is oriented to person, place, and time. She appears well-developed and well-nourished. No distress.  HENT:  Head: Normocephalic and atraumatic.  Mouth/Throat: Oropharynx is clear and moist. No oropharyngeal exudate.  Brown graying  hair.  Headband.  Mask.   Eyes: Pupils are equal, round, and reactive to light. Conjunctivae and EOM are normal. Right eye exhibits no discharge. Left eye exhibits no discharge. No scleral icterus.  Brown eyes.  Neck: No JVD present.  Cardiovascular: Normal rate, regular rhythm and normal heart sounds. Exam reveals no gallop and no friction rub.  No murmur heard. Pulmonary/Chest: Effort normal and breath sounds normal. No respiratory distress. She has no wheezes. She has no rales. She exhibits no tenderness.  Abdominal: Soft. Bowel sounds are normal. She exhibits no distension and no mass. There is no abdominal tenderness. There is no rebound and no guarding.  Musculoskeletal:        General: No tenderness or edema. Normal range of motion.     Cervical back: Normal range of motion and neck supple.     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  Lymphadenopathy:       Head (right side): No preauricular, no posterior auricular and no occipital adenopathy present.       Head (left side): No preauricular, no posterior auricular and no  occipital adenopathy present.    She has no cervical adenopathy.    She has no axillary adenopathy.       Right: No supraclavicular adenopathy present.       Left: No supraclavicular adenopathy present.  Neurological: She is alert and oriented to person, place, and time.  Skin: Skin is warm and dry. No rash noted. She is not diaphoretic. No erythema. No pallor.  Psychiatric: She has a normal mood and affect. Her behavior is normal. Judgment and thought content normal.  Nursing note and vitals reviewed.   No visits with results within 3 Day(s) from this visit.  Latest known visit with results is:  Appointment on 05/16/2019  Component Date Value Ref Range Status  . Sodium 05/16/2019 138  135 - 145 mmol/L Final  . Potassium 05/16/2019 3.8  3.5 - 5.1 mmol/L Final  . Chloride 05/16/2019 102  98 - 111 mmol/L Final  . CO2 05/16/2019 28  22 - 32 mmol/L Final  .  Glucose, Bld 05/16/2019 98  70 - 99 mg/dL Final   Glucose reference range applies only to samples taken after fasting for at least 8 hours.  . BUN 05/16/2019 14  8 - 23 mg/dL Final  . Creatinine, Ser 05/16/2019 0.66  0.44 - 1.00 mg/dL Final  . Calcium 05/16/2019 9.0  8.9 - 10.3 mg/dL Final  . Total Protein 05/16/2019 7.9  6.5 - 8.1 g/dL Final  . Albumin 05/16/2019 3.9  3.5 - 5.0 g/dL Final  . AST 05/16/2019 19  15 - 41 U/L Final  . ALT 05/16/2019 16  0 - 44 U/L Final  . Alkaline Phosphatase 05/16/2019 68  38 - 126 U/L Final  . Total Bilirubin 05/16/2019 0.8  0.3 - 1.2 mg/dL Final  . GFR calc non Af Amer 05/16/2019 >60  >60 mL/min Final  . GFR calc Af Amer 05/16/2019 >60  >60 mL/min Final  . Anion gap 05/16/2019 8  5 - 15 Final   Performed at [facility], [address], [place], [place] [address]  . WBC 05/16/2019 2.8* 4.0 - 10.5 K/uL Final  . RBC 05/16/2019 4.00  3.87 - 5.11 MIL/uL Final  . Hemoglobin 05/16/2019 12.7  12.0 - 15.0 g/dL Final  . HCT 05/16/2019 38.9  36.0 - 46.0 % Final  . MCV 05/16/2019 97.3  80.0 - 100.0 fL Final  . MCH 05/16/2019 31.8  26.0 - 34.0 pg Final  . MCHC 05/16/2019 32.6  30.0 - 36.0 g/dL Final  . RDW 05/16/2019 15.2  11.5 - 15.5 % Final  . Platelets 05/16/2019 330  150 - 400 K/uL Final  . nRBC 05/16/2019 0.0  0.0 - 0.2 % Final  . Neutrophils Relative % 05/16/2019 40  % Final  . Neutro Abs 05/16/2019 1.1* 1.7 - 7.7 K/uL Final  . Lymphocytes Relative 05/16/2019 48  % Final  . Lymphs Abs 05/16/2019 1.3  0.7 - 4.0 K/uL Final  . Monocytes Relative 05/16/2019 10  % Final  . Monocytes Absolute 05/16/2019 0.3  0.1 - 1.0 K/uL Final  . Eosinophils Relative 05/16/2019 2  % Final  . Eosinophils Absolute 05/16/2019 0.1  0.0 - 0.5 K/uL Final  . Basophils Relative 05/16/2019 0  % Final  . Basophils Absolute 05/16/2019 0.0  0.0 - 0.1 K/uL Final  . Immature Granulocytes 05/16/2019 0  % Final  . Abs Immature Granulocytes 05/16/2019 0.01  0.00 - 0.07 K/uL  Final   Performed at [facility], [phone]  [address], [place], [place] [address]  . CA 19-9 05/16/2019 52* 0 - 35 U/mL Final   Comment: (NOTE) **Verified by repeat analysis** Roche [employer] Electrochemiluminescence Immunoassay (ECLIA) Values obtained with different assay methods or kits cannot be used interchangeably.  Results cannot be interpreted as absolute evidence of the presence or absence of malignant disease. 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 WBC ranges between 2800 - 4400 since at least 03/12/2014.  Work-up on 06/28/2017 revealed a WBC 4,200, ANC 2,300, hemoglobin 13.7, hematocrit 40.9, platelets 382,000. B12 was 257 (low).  Normal studies included:  folate (12.8), HIV testing, hepatitis C antibody, hepatitis B core antibody total.  Peripheral smear was unremarkable.  Normal labs on 11/10/2018: folate (10.2), copper (122), and ANA.  She also has a history of pancreatic lesion initially seen on a abdomen CT in 05/2016.  MRCP MRI on 11/30/2017 revealed a septated cyst on the body of the pancreas, 1.7cm in maximum dimension. Additionally, there were liver and right kidney cysts, with mild hepatic steatosis. Abdominal MRI on 07/04/2018 revealed stable small cystic lesions in the pancreas.  MRCP MRI on 11/14/2018 revealed stable small pancreatic cystic lesions, largest in the pancreatic body measuring 1.7 cm.  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 Recommendation included continued follow-up by MRI in 6 months.   She has B12 deficiency.  B12 was 257 on 06/28/2017 and 549 on 11/10/2018.  She is on oral B12.  She has been off her oral B12  Symptomatically, she feels "well".  She feels less fatigued.  She denies any abdominal symptoms.  Plan: 1.   Review labs from  05/16/2019. 2.   Mild leukopenia             WBC 2800 with an ANC of 1100.  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                         No increased risk of infections.                         Set point for circulating neutrophils lower.  Associated with Duffy null RBC type.                         No treatment required.             Normal labs included: folate, copper and ANA.              Peripheral smear revealed no abnormality.             No medications were implicated.             Continue neutropenic precautions. 3.   B12 deficiency             B12 was 257 on 06/28/2017 and 549 on 11/10/2018.    B12 goal is 400.             Encourage patient to continue oral B12.  Check folate annually 4.   Pancreatic lesion  MRCP MRI on 11/14/2018 revealed stable small pancreatic cystic lesions.             Review plan for follow-up abdominal MRI in 6 months. 5.  [facility] labs (CA19-9, B12) in 1 month. 6.  RN or MD to call patient after above labs. 7.  Abdomen MRI on 05/21/2020. 8.  RTC after MRI for MD assessment, labs (CBC with diff, CMP, CA19-9, folate), and review of imaging.  I discussed the assessment and treatment plan with the patient.  The patient was provided an opportunity to ask questions and all were answered.  The patient agreed with the plan and demonstrated an understanding of the instructions.  The patient was advised to call back if the symptoms worsen or if the condition fails to improve as anticipated.   Lequita Asal, MD, PhD    05/24/2019, 1:20 PM  I, Heywood Footman, am acting as a Education administrator for Lequita Asal, MD.  I, [place] Mike Gip, MD, have reviewed the above documentation for accuracy and completeness, and I agree with the above.

## 2019-05-22 ENCOUNTER — Other Ambulatory Visit: Payer: Self-pay

## 2019-05-22 ENCOUNTER — Ambulatory Visit
Admission: RE | Admit: 2019-05-22 | Discharge: 2019-05-22 | Disposition: A | Discharge status: Home / Self Care | Payer: Medicare HMO | Source: Ambulatory Visit | Attending: Hematology and Oncology | Admitting: Hematology and Oncology

## 2019-05-22 ENCOUNTER — Other Ambulatory Visit: Payer: Self-pay | Admitting: Hematology and Oncology

## 2019-05-22 DIAGNOSIS — K862 Cyst of pancreas: Secondary | ICD-10-CM | POA: Diagnosis not present

## 2019-05-22 MED ORDER — GADOBUTROL 1 MMOL/ML IV SOLN
9.0000 mL | Freq: Once | INTRAVENOUS | Status: AC | PRN
  Administered 2019-05-22: 9 mL via INTRAVENOUS

## 2019-05-23 ENCOUNTER — Other Ambulatory Visit: Payer: Self-pay

## 2019-05-23 NOTE — Progress Notes (Signed)
Patient pre screened for office appointment, no questions or concerns today. Patient reminded of upcoming appointment time and date. 

## 2019-05-24 ENCOUNTER — Inpatient Hospital Stay: Payer: Medicare HMO | Admitting: Hematology and Oncology

## 2019-05-24 ENCOUNTER — Encounter: Payer: Self-pay | Admitting: Hematology and Oncology

## 2019-05-24 VITALS — BP 141/76 | HR 64 | Temp 97.5°F | Wt 215.5 lb

## 2019-05-24 DIAGNOSIS — K862 Cyst of pancreas: Secondary | ICD-10-CM

## 2019-05-24 DIAGNOSIS — D709 Neutropenia, unspecified: Secondary | ICD-10-CM

## 2019-05-24 DIAGNOSIS — E538 Deficiency of other specified B group vitamins: Secondary | ICD-10-CM | POA: Diagnosis not present

## 2019-05-24 DIAGNOSIS — D72818 Other decreased white blood cell count: Secondary | ICD-10-CM | POA: Diagnosis not present

## 2019-07-06 ENCOUNTER — Encounter: Payer: Self-pay | Admitting: Hematology and Oncology

## 2019-11-03 NOTE — Progress Notes (Signed)
[facility]  [address], [address] [place], [place] [address] Phone: [phone]  Fax: [phone]   Clinic Day:  11/06/2019  Referring physician: Sharyne Peach, MD  Chief Complaint: Darlene Chen is a 69 y.o. female with constitutional leukopenia and pancreatic cystic lesions who is seen for 5 month assessment.  HPI: The patient was last seen in the hematology clinic on 05/24/2019.  At that time, she felt "well".  She felt less fatigued.  She denied any abdominal symptoms.  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 Continued annual follow-up was suggested.  Hematocrit was 38.9, hemoglobin 12.7, platelets 330,000, WBC 2,800 (ANC 1,100). CMP was normal. CA19-9 was 52.  We reviewed the plan for follow-up abdominal MRI in 1 year.  The patient saw Dr. Clayborn Bigness on 10/25/2019.  She had spent the last 6 months in [place] with family. Hematocrit was 39.6, hemoglobin 12.7, platelets 343,000, WBC 2,600. CMP was normal except for a blood glucose of 127. Hgb A1C was 5.9%.  She has a follow-up appointment in 6 months.  [facility] labs on 07/05/2019 revealed a CA19-9 of 47 and a vitamin B12 of 1763.  During the interim, she has been good. She has never had any problems with infections. She is not taking any new medications or herbal products. She denies fevers, weight loss, chest pain, shortness of breath, cough, new lumps or bumps, bruising, or bleeding. She has occasional sweats and rare headaches.  She is not taking Welchol anymore because her insurance stopped covering it. She denies diarrhea or soft stools.  The patient is scheduled for a left knee replacement in 12/2019 in [place]. She is leaving for [place] the first week of December. She planning on getting her right knee replaced too.  She would like an abdominal MRI. She  agrees to get a second opinion at [facility].   Past Medical History:  Diagnosis Date  . Diabetes mellitus without complication ([place])   . High cholesterol   . Hypertension   . Neutropenia [facility])     Past Surgical History:  Procedure Laterality Date  . arthroscopic knee surgery  on left 2000    . BREAST BIOPSY Right 2019   bx/clip-neg  . LEFT HEART CATH AND CORONARY ANGIOGRAPHY N/A 02/09/2017   Procedure: LEFT HEART CATH AND CORONARY ANGIOGRAPHY;  Surgeon: Yolonda Kida, MD;  Location: [facility];  Service: Cardiovascular;  Laterality: N/A;  . REDUCTION MAMMAPLASTY Bilateral    1990  . TUBAL LIGATION  1985  . UMBILICAL HERNIA REPAIR  2007    Family History  Problem Relation Age of Onset  . Breast cancer Sister 91    Social History:  reports that she has never smoked. She has never used smokeless tobacco. She reports that she does not drink alcohol and does not use drugs. does not drink alcohol or use drugs.She denies any alcohol or tobacco. She denies any know exposure to radiation or toxins. She is not currently working.  She lives in [place]. The patient is alone today.   Allergies:  Allergies  Allergen Reactions  . Colesevelam Nausea Only  . Biaxin [Clarithromycin] Hives  . Niacin Other (See Comments)    Burning from inside   . Statins Other (See Comments)    Muscle/joint pain.  . Sulfa Antibiotics Hives  . Sulfur Dioxide   . Tramadol Other (See  Comments)    Unsure of exact reaction type  . Ezetimibe Other (See Comments)    Muscle/joint pain    Current Medications: Current Outpatient Medications  Medication Sig Dispense Refill  . amLODipine (NORVASC) 10 MG tablet Take 10 mg by mouth daily.    Marland Kitchen aspirin EC 81 MG tablet Take 81 mg by mouth daily.    . cycloSPORINE (RESTASIS) 0.05 % ophthalmic emulsion Place 1 drop into both eyes 2 (two) times daily as needed.     . diclofenac sodium (VOLTAREN) 1 % GEL Apply 1 application topically as needed.    .  fluocinonide cream (LIDEX) 4.19 % Apply 1 application topically 2 (two) times daily as needed. For eczema  0  . hydrochlorothiazide (HYDRODIURIL) 12.5 MG tablet Take 12.5 mg by mouth daily.    . Melatonin (MELATONIN MAXIMUM STRENGTH) 5 MG TABS Take 2 tab qhs    . metFORMIN (GLUCOPHAGE) 500 MG tablet Take 500 mg by mouth 2 (two) times daily.    . metoprolol succinate (TOPROL-XL) 25 MG 24 hr tablet Take 25 mg by mouth daily.    Marland Kitchen LORazepam (ATIVAN) 0.5 MG tablet Take 1 tab 15-20 minutes prior to procedure. (Patient not taking: Reported on 11/06/2019)     No current facility-administered medications for this visit.    Review of Systems  Constitutional: Positive for diaphoresis (occasional). Negative for chills, fever, malaise/fatigue and weight loss (up 8 lbs).       Feels well.  HENT: Negative.  Negative for congestion, ear discharge, ear pain, hearing loss, nosebleeds, sinus pain, sore throat and tinnitus.   Eyes: Negative.  Negative for blurred vision, double vision and photophobia.  Respiratory: Negative.  Negative for cough, hemoptysis, sputum production and shortness of breath.   Cardiovascular: Negative.  Negative for chest pain, palpitations and leg swelling.  Gastrointestinal: Negative for abdominal pain, blood in stool, constipation, diarrhea, heartburn, melena, nausea and vomiting.       Normal bowels.  Genitourinary: Negative.  Negative for dysuria, flank pain, frequency, hematuria and urgency.  Musculoskeletal: Negative for back pain, falls, joint pain, myalgias and neck pain.  Skin: Negative.  Negative for itching and rash.  Neurological: Positive for headaches (rare). Negative for dizziness, tingling, speech change, focal weakness, seizures and weakness.  Endo/Heme/Allergies: Negative.  Negative for environmental allergies. Does not bruise/bleed easily.  Psychiatric/Behavioral: Negative.  Negative for depression and memory loss. The patient is not nervous/anxious and does not have  insomnia.   All other systems reviewed and are negative.  Performance status (ECOG):  0  Vitals Blood pressure 129/80, pulse 63, temperature (!) 97.2 F (36.2 C), temperature source Tympanic, weight 223 lb 8.7 oz (101.4 kg), SpO2 98 %.   Physical Exam Vitals and nursing note reviewed.  Constitutional:      General: She is not in acute distress.    Appearance: She is well-developed. She is not diaphoretic.  HENT:     Head: Normocephalic and atraumatic.     Comments: Short styled hair.    Mouth/Throat:     Pharynx: No oropharyngeal exudate.  Eyes:     General: No scleral icterus.    Conjunctiva/sclera: Conjunctivae normal.     Pupils: Pupils are equal, round, and reactive to light.     Comments: Brown eyes.  Neck:     Vascular: No JVD.  Cardiovascular:     Rate and Rhythm: Normal rate and regular rhythm.     Heart sounds: Normal heart sounds. No murmur heard.  No  friction rub. No gallop.   Pulmonary:     Effort: Pulmonary effort is normal. No respiratory distress.     Breath sounds: Normal breath sounds. No wheezing or rales.  Chest:     Chest wall: No tenderness.  Abdominal:     General: Bowel sounds are normal. There is no distension.     Palpations: Abdomen is soft. There is no mass.     Tenderness: There is no abdominal tenderness. There is no guarding or rebound.  Musculoskeletal:        General: No tenderness. Normal range of motion.     Cervical back: Normal range of motion and neck supple.  Lymphadenopathy:     Head:     Right side of head: No preauricular, posterior auricular or occipital adenopathy.     Left side of head: No preauricular, posterior auricular or occipital adenopathy.     Cervical: No cervical adenopathy.     Upper Body:     Right upper body: No supraclavicular adenopathy.     Left upper body: No supraclavicular adenopathy.  Skin:    General: Skin is warm and dry.     Coloration: Skin is not pale.     Findings: No erythema or rash.   Neurological:     Mental Status: She is alert and oriented to person, place, and time.  Psychiatric:        Behavior: Behavior normal.        Thought Content: Thought content normal.        Judgment: Judgment normal.    No visits with results within 3 Day(s) from this visit.  Latest known visit with results is:  Appointment on 05/16/2019  Component Date Value Ref Range Status  . Sodium 05/16/2019 138  135 - 145 mmol/L Final  . Potassium 05/16/2019 3.8  3.5 - 5.1 mmol/L Final  . Chloride 05/16/2019 102  98 - 111 mmol/L Final  . CO2 05/16/2019 28  22 - 32 mmol/L Final  . Glucose, Bld 05/16/2019 98  70 - 99 mg/dL Final   Glucose reference range applies only to samples taken after fasting for at least 8 hours.  . BUN 05/16/2019 14  8 - 23 mg/dL Final  . Creatinine, Ser 05/16/2019 0.66  0.44 - 1.00 mg/dL Final  . Calcium 05/16/2019 9.0  8.9 - 10.3 mg/dL Final  . Total Protein 05/16/2019 7.9  6.5 - 8.1 g/dL Final  . Albumin 05/16/2019 3.9  3.5 - 5.0 g/dL Final  . AST 05/16/2019 19  15 - 41 U/L Final  . ALT 05/16/2019 16  0 - 44 U/L Final  . Alkaline Phosphatase 05/16/2019 68  38 - 126 U/L Final  . Total Bilirubin 05/16/2019 0.8  0.3 - 1.2 mg/dL Final  . GFR calc non Af Amer 05/16/2019 >60  >60 mL/min Final  . GFR calc Af Amer 05/16/2019 >60  >60 mL/min Final  . Anion gap 05/16/2019 8  5 - 15 Final   Performed at [facility], [address], [place], [place] [address]  . WBC 05/16/2019 2.8* 4.0 - 10.5 K/uL Final  . RBC 05/16/2019 4.00  3.87 - 5.11 MIL/uL Final  . Hemoglobin 05/16/2019 12.7  12.0 - 15.0 g/dL Final  . HCT 05/16/2019 38.9  36 - 46 % Final  . MCV 05/16/2019 97.3  80.0 - 100.0 fL Final  . MCH 05/16/2019 31.8  26.0 - 34.0 pg Final  . MCHC 05/16/2019 32.6  30.0 - 36.0 g/dL  Final  . RDW 05/16/2019 15.2  11.5 - 15.5 % Final  . Platelets 05/16/2019 330  150 - 400 K/uL Final  . nRBC 05/16/2019 0.0  0.0 - 0.2 % Final  . Neutrophils Relative % 05/16/2019 40  %  Final  . Neutro Abs 05/16/2019 1.1* 1.7 - 7.7 K/uL Final  . Lymphocytes Relative 05/16/2019 48  % Final  . Lymphs Abs 05/16/2019 1.3  0.7 - 4.0 K/uL Final  . Monocytes Relative 05/16/2019 10  % Final  . Monocytes Absolute 05/16/2019 0.3  0.1 - 1.0 K/uL Final  . Eosinophils Relative 05/16/2019 2  % Final  . Eosinophils Absolute 05/16/2019 0.1  0.0 - 0.5 K/uL Final  . Basophils Relative 05/16/2019 0  % Final  . Basophils Absolute 05/16/2019 0.0  0.0 - 0.1 K/uL Final  . Immature Granulocytes 05/16/2019 0  % Final  . Abs Immature Granulocytes 05/16/2019 0.01  0.00 - 0.07 K/uL Final   Performed at [facility], [address], [place], [place] [address]  . CA 19-9 05/16/2019 52* 0 - 35 U/mL Final   Comment: (NOTE) **Verified by repeat analysis** Roche Diagnostics Electrochemiluminescence Immunoassay (ECLIA) Values obtained with different assay methods or kits cannot be used interchangeably.  Results cannot be interpreted as absolute evidence of the presence or absence of malignant disease. Performed At: [facility] [place], [place] [id number] Rush Farmer MD MO:[id number]     Assessment:  Kierah Goatley is a 69 y.o. female with benign ethnic leukopenia/constitutional neutropenia. WBC has ranged between 2800 - 4400 since at least 03/12/2014 (no prior CBCs available).  Work-up on 06/28/2017 revealed a WBC 4,200, ANC 2,300, hemoglobin 13.7, hematocrit 40.9, platelets 382,000. B12 was 257 (low).  Normal studies included:  folate (12.8), HIV testing, hepatitis C antibody, hepatitis B core antibody total.  Peripheral smear was unremarkable.  Normal labs on 11/10/2018: folate (10.2), copper (122), and ANA.  She also has a history of pancreatic lesion initially seen on a abdomen CT in 05/2016.  MRCP MRI on 11/30/2017 revealed a septated cyst on the body of the pancreas, 1.7cm in maximum dimension. Additionally, there were liver and right kidney cysts,  with mild hepatic steatosis. Abdominal MRI on 07/04/2018 revealed stable small cystic lesions in the pancreas.  MRCP MRI on 11/14/2018 revealed stable small pancreatic cystic lesions, largest in the pancreatic body measuring 1.7 cm.  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 Recommendation included continued follow-up by MRI in 6 months.  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  Continued annual follow-up was suggested.  CA 19-9 has been followed: 47 on 07/05/2019.  She has B12 deficiency.  B12 was 257 on 06/28/2017, 549 on 11/10/2018, and 1763 on 07/05/2019.  She is on oral B12.  She has been off her oral B12  Symptomatically, she is doing well.  She denies any B symptoms.  She is having issues with her knees and is planning bilateral knee replacement.  Exam reveals no adenopathy or hepatosplenomegaly.  Plan: 1.   Labs today: CBC with diff, CA19-9, B12, folate. 2.   Peripheral smear for path review. 3.   Mild leukopenia             CBCs prior to 2016 are unavailable.  WBC has ranged between 2800 - 4400 since at least 03/12/2014   WBC 2700 with an  ANC of 1300 today.  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                         No increased risk of infections.                         Set point for circulating neutrophils lower.  Associated with Duffy null RBC type.                         No treatment required.             Normal labs included: folate, copper and ANA.              Peripheral smear in 2019 revealed no abnormality.             No medications have been implicated.             She has had no infections, gingivitis, or B symptoms.  Exam reveals no adenopathy or hepatosplenomegaly. 4.   B12 deficiency             B12 was  257 on 06/28/2017 and 549 on 11/10/2018.   Recently B12 was 1763 on 07/05/2019.    B12 goal is 400.             Recheck B12 and adjust supplementation.  Folate is 8.5 today. 5.   Pancreatic lesion             Abdomen MRI on 05/22/2019 revealed scattered pancreatic cysts.    There was a dominant 14 mm cystic lesion in the pancreatic tail (pseudocyst or side branch IPMN).   Continued annual follow-up was suggested.  Patient notes abdominal pain 2 weeks ago.  Patient requests f/u imaging.  Review prior recommendations.  Schedule repeat imaging prior to 12/2019. 6.   Planned bilateral knee surgery  She is scheduled for total knee replacement on 12/19/2019 in [place].  She notes no prior surgery with issues of infection (or low counts).  Discuss additional testing (marrow/flow cytometry) or second opinion prior to surgery.  Patient agrees to second opinion at [facility]. 7.   Abdomen MRI f/u pancreatic cyst. 8.   [facility] second opinion (Dr. Adriana Simas). 9.   RTC after abdomen MRI for MD assessment and review of imaging studies.  I discussed the assessment and treatment plan with the patient.  The patient was provided an opportunity to ask questions and all were answered.  The patient agreed with the plan and demonstrated an understanding of the instructions.  The patient was advised to call back if the symptoms worsen or if the condition fails to improve as anticipated.  I provided 19 minutes of face-to-face time during this this encounter and > 50% was spent counseling as documented under my assessment and plan.   Darlene Asal, MD, PhD    11/06/2019, 10:48 AM  I, Mirian Mo Tufford, am acting as a Education administrator for Darlene Asal, MD.  I, [employer] Mike Gip, MD, have reviewed the above documentation for accuracy and completeness, and I agree with the above.

## 2019-11-04 ENCOUNTER — Other Ambulatory Visit: Payer: Self-pay | Admitting: Hematology and Oncology

## 2019-11-04 DIAGNOSIS — E538 Deficiency of other specified B group vitamins: Secondary | ICD-10-CM

## 2019-11-06 ENCOUNTER — Other Ambulatory Visit: Payer: Self-pay

## 2019-11-06 ENCOUNTER — Inpatient Hospital Stay: Payer: Medicare HMO

## 2019-11-06 ENCOUNTER — Telehealth: Payer: Self-pay | Admitting: *Deleted

## 2019-11-06 ENCOUNTER — Encounter: Payer: Self-pay | Admitting: Hematology and Oncology

## 2019-11-06 ENCOUNTER — Inpatient Hospital Stay: Payer: Medicare HMO | Attending: Hematology and Oncology | Admitting: Hematology and Oncology

## 2019-11-06 DIAGNOSIS — I10 Essential (primary) hypertension: Secondary | ICD-10-CM | POA: Insufficient documentation

## 2019-11-06 DIAGNOSIS — Z79899 Other long term (current) drug therapy: Secondary | ICD-10-CM | POA: Insufficient documentation

## 2019-11-06 DIAGNOSIS — D709 Neutropenia, unspecified: Secondary | ICD-10-CM

## 2019-11-06 DIAGNOSIS — Z791 Long term (current) use of non-steroidal anti-inflammatories (NSAID): Secondary | ICD-10-CM | POA: Diagnosis not present

## 2019-11-06 DIAGNOSIS — Z7982 Long term (current) use of aspirin: Secondary | ICD-10-CM | POA: Insufficient documentation

## 2019-11-06 DIAGNOSIS — E7800 Pure hypercholesterolemia, unspecified: Secondary | ICD-10-CM | POA: Diagnosis not present

## 2019-11-06 DIAGNOSIS — E538 Deficiency of other specified B group vitamins: Secondary | ICD-10-CM | POA: Diagnosis not present

## 2019-11-06 DIAGNOSIS — R978 Other abnormal tumor markers: Secondary | ICD-10-CM | POA: Insufficient documentation

## 2019-11-06 DIAGNOSIS — D72819 Decreased white blood cell count, unspecified: Secondary | ICD-10-CM | POA: Insufficient documentation

## 2019-11-06 DIAGNOSIS — E119 Type 2 diabetes mellitus without complications: Secondary | ICD-10-CM | POA: Insufficient documentation

## 2019-11-06 DIAGNOSIS — K862 Cyst of pancreas: Secondary | ICD-10-CM | POA: Diagnosis not present

## 2019-11-06 LAB — CBC WITH DIFFERENTIAL/PLATELET
Abs Immature Granulocytes: 0 10*3/uL (ref 0.00–0.07)
Basophils Absolute: 0 10*3/uL (ref 0.0–0.1)
Basophils Relative: 0 %
Eosinophils Absolute: 0 10*3/uL (ref 0.0–0.5)
Eosinophils Relative: 1 %
HCT: 39.5 % (ref 36.0–46.0)
Hemoglobin: 12.9 g/dL (ref 12.0–15.0)
Immature Granulocytes: 0 %
Lymphocytes Relative: 42 %
Lymphs Abs: 1.2 10*3/uL (ref 0.7–4.0)
MCH: 31.2 pg (ref 26.0–34.0)
MCHC: 32.7 g/dL (ref 30.0–36.0)
MCV: 95.4 fL (ref 80.0–100.0)
Monocytes Absolute: 0.2 10*3/uL (ref 0.1–1.0)
Monocytes Relative: 8 %
Neutro Abs: 1.3 10*3/uL — ABNORMAL LOW (ref 1.7–7.7)
Neutrophils Relative %: 49 %
Platelets: 311 10*3/uL (ref 150–400)
RBC: 4.14 MIL/uL (ref 3.87–5.11)
RDW: 14.4 % (ref 11.5–15.5)
WBC: 2.7 10*3/uL — ABNORMAL LOW (ref 4.0–10.5)
nRBC: 0 % (ref 0.0–0.2)

## 2019-11-06 LAB — VITAMIN B12: Vitamin B-12: 2538 pg/mL — ABNORMAL HIGH (ref 180–914)

## 2019-11-06 LAB — FOLATE: Folate: 8.5 ng/mL (ref >5.9)

## 2019-11-06 LAB — PATHOLOGIST SMEAR REVIEW

## 2019-11-06 NOTE — Progress Notes (Signed)
Pt for upcoming knee replacement. She had cbc last at [facility]. Wbc 2.6, the last one in [facility] 2.8. pt wants to make sure that she is where she needs to be in order to get the surgery. She has not been sick and no infections to her knowledge.

## 2019-11-06 NOTE — Telephone Encounter (Signed)
I scanned demographics, insurance, and notes from provider to [facility] the navigator for Dr.Reeves.  All of this was sent in email. Also provided the courtney grissett email for the appt once it is scheduled.

## 2019-11-07 ENCOUNTER — Telehealth: Payer: Self-pay

## 2019-11-07 ENCOUNTER — Other Ambulatory Visit: Payer: Self-pay

## 2019-11-07 DIAGNOSIS — E538 Deficiency of other specified B group vitamins: Secondary | ICD-10-CM

## 2019-11-07 LAB — CANCER ANTIGEN 19-9: CA 19-9: 68 U/mL — ABNORMAL HIGH (ref 0–35)

## 2019-11-07 NOTE — Telephone Encounter (Signed)
Left message with family member for patient to return call regarding B12

## 2019-11-15 ENCOUNTER — Other Ambulatory Visit: Payer: Self-pay | Admitting: Hematology and Oncology

## 2019-11-15 DIAGNOSIS — K862 Cyst of pancreas: Secondary | ICD-10-CM

## 2019-11-17 ENCOUNTER — Ambulatory Visit: Admission: RE | Admit: 2019-11-17 | Payer: Medicare HMO | Source: Ambulatory Visit

## 2019-11-17 ENCOUNTER — Ambulatory Visit
Admission: RE | Admit: 2019-11-17 | Discharge: 2019-11-17 | Disposition: A | Discharge status: Home / Self Care | Payer: Medicare HMO | Source: Ambulatory Visit | Attending: Hematology and Oncology | Admitting: Hematology and Oncology

## 2019-11-17 ENCOUNTER — Other Ambulatory Visit: Payer: Self-pay

## 2019-11-17 DIAGNOSIS — K862 Cyst of pancreas: Secondary | ICD-10-CM | POA: Insufficient documentation

## 2019-11-17 MED ORDER — GADOBUTROL 1 MMOL/ML IV SOLN
10.0000 mL | Freq: Once | INTRAVENOUS | Status: AC | PRN
  Administered 2019-11-17: 10 mL via INTRAVENOUS

## 2019-11-27 ENCOUNTER — Inpatient Hospital Stay: Payer: Medicare HMO | Admitting: Hematology and Oncology

## 2019-11-27 ENCOUNTER — Ambulatory Visit: Payer: Medicare HMO | Admitting: Hematology and Oncology

## 2019-11-27 DIAGNOSIS — E538 Deficiency of other specified B group vitamins: Secondary | ICD-10-CM

## 2019-11-27 DIAGNOSIS — D709 Neutropenia, unspecified: Secondary | ICD-10-CM

## 2019-11-27 DIAGNOSIS — K862 Cyst of pancreas: Secondary | ICD-10-CM

## 2019-12-04 NOTE — Progress Notes (Signed)
[facility]  [address] [place], [place] [address] Phone: [phone]  Fax: [phone]   Clinic Day:  December 19, 2019  Referring physician: Sharyne Peach, MD  Chief Complaint: Darlene Chen is a 69 y.o. female with constitutional leukopenia and pancreatic cystic lesions who is seen for 3 week assessment and review of interval abdominal MRI.  HPI: The patient was last seen in the hematology clinic on 11/06/2019.  At that time, she was doing well.  She denied any B symptoms.  She was having issues with her knees and was planning bilateral knee replacement.  Exam revealed no adenopathy or hepatosplenomegaly. Hematocrit was 39.5, hemoglobin 12.9, platelets 311,000, WBC 2,700 (ANC 1,300). CA19-9 was 68 (0-35). Vitamin B12 was 2,538 and folate 8.5.  We discussed a [facility] hematology second opinion prior to surgery.  Peripheral smear revealed absolute leukopenia, with ANC of 1300. Mature neutrophils showed appropriate nuclear lobation and cytoplasmic granularity. There was no increase in circulating blasts. RBC parameters and morphology were normal. Platelet count and morphology were normal.  Abdominal MRI with and without contrast on 11/17/2019 revealed a stable septated 1.7 cm cystic lesion in the pancreatic body communicating with the nondilated main pancreatic duct, most c/w a side-branch IPMN. There were no high risk MRI features. Annual follow-up MRI abdomen without and with IV contrast was recommended until 5 years stability demonstrated. There was no acute abnormality. There was a tiny anterior gallbladder wall polyp, not clearly seen on prior MRI, requiring no follow-up. There was no cholelithiasis. There was diffuse colonic diverticulosis.  During the interim, she has been "ok". She is taking vitamin B12 almost everyday but does not know what the dose. She is postponing her knee replacement to the spring because her sister recently passed  away.   Past Medical History:  Diagnosis Date  . Diabetes mellitus without complication ([place])   . High cholesterol   . Hypertension   . Neutropenia [facility])     Past Surgical History:  Procedure Laterality Date  . arthroscopic knee surgery  on left 2000    . BREAST BIOPSY Right 2019   bx/clip-neg  . LEFT HEART CATH AND CORONARY ANGIOGRAPHY N/A 02/09/2017   Procedure: LEFT HEART CATH AND CORONARY ANGIOGRAPHY;  Surgeon: Yolonda Kida, MD;  Location: [facility];  Service: Cardiovascular;  Laterality: N/A;  . REDUCTION MAMMAPLASTY Bilateral    1990  . TUBAL LIGATION  1985  . UMBILICAL HERNIA REPAIR  2007    Family History  Problem Relation Age of Onset  . Breast cancer Sister 19    Social History:  reports that she has never smoked. She has never used smokeless tobacco. She reports that she does not drink alcohol and does not use drugs. does not drink alcohol or use drugs. She denies any alcohol or tobacco. She denies any know exposure to radiation or toxins. Her sister passed away last week (12-19-2019). She is not currently working.  She lives in [place]. The patient is alone today.  Allergies:  Allergies  Allergen Reactions  . Colesevelam Nausea Only  . Biaxin [Clarithromycin] Hives  . Niacin Other (See Comments)    Burning from inside   . Statins Other (See Comments)    Muscle/joint pain.  . Sulfa Antibiotics Hives  . Sulfur Dioxide   . Tramadol Other (See Comments)    Unsure of exact reaction type  . Ezetimibe Other (See Comments)    Muscle/joint pain    Current Medications: Current Outpatient  Medications  Medication Sig Dispense Refill  . amLODipine (NORVASC) 10 MG tablet Take 10 mg by mouth daily.    Marland Kitchen ammonium lactate (LAC-HYDRIN) 12 % lotion Apply topically.    Marland Kitchen aspirin EC 81 MG tablet Take 81 mg by mouth daily.    . cycloSPORINE (RESTASIS) 0.05 % ophthalmic emulsion Place 1 drop into both eyes 2 (two) times daily as needed.     . diclofenac  sodium (VOLTAREN) 1 % GEL Apply 1 application topically as needed.    . fluocinonide cream (LIDEX) 2.53 % Apply 1 application topically 2 (two) times daily as needed. For eczema  0  . hydrochlorothiazide (HYDRODIURIL) 12.5 MG tablet Take 12.5 mg by mouth daily.    . Melatonin (MELATONIN MAXIMUM STRENGTH) 5 MG TABS Take 2 tab qhs    . metFORMIN (GLUCOPHAGE) 500 MG tablet Take 500 mg by mouth 2 (two) times daily.    . metoprolol succinate (TOPROL-XL) 25 MG 24 hr tablet Take 25 mg by mouth daily.    Marland Kitchen LORazepam (ATIVAN) 0.5 MG tablet Take 1 tab 15-20 minutes prior to procedure. (Patient not taking: Reported on 11/06/2019)     No current facility-administered medications for this visit.   Review of Systems  Constitutional: Negative for chills, diaphoresis, fever, malaise/fatigue and weight loss (up 1 lb).  HENT: Negative for congestion, ear discharge, ear pain, hearing loss, nosebleeds, sinus pain, sore throat and tinnitus.   Eyes: Negative for blurred vision.  Respiratory: Negative for cough, hemoptysis, sputum production and shortness of breath.   Cardiovascular: Negative for chest pain, palpitations and leg swelling.  Gastrointestinal: Negative for abdominal pain, blood in stool, constipation, diarrhea, heartburn, melena, nausea and vomiting.  Genitourinary: Negative for dysuria, frequency, hematuria and urgency.  Musculoskeletal: Negative for back pain, joint pain, myalgias and neck pain.       B/L knee replacement postponed to the spring.  Skin: Negative for itching and rash.  Neurological: Negative for dizziness, tingling, sensory change, weakness and headaches.  Endo/Heme/Allergies: Does not bruise/bleed easily.  Psychiatric/Behavioral: Negative for depression and memory loss. The patient is not nervous/anxious and does not have insomnia.   All other systems reviewed and are negative.  Performance status (ECOG):  0  Vitals Blood pressure 131/80, pulse 70, temperature (!) 96.4 F (35.8  C), temperature source Tympanic, resp. rate 18, weight 224 lb 13.9 oz (102 kg), SpO2 99 %.   Physical Exam Vitals and nursing note reviewed.  Constitutional:      General: She is not in acute distress.    Appearance: She is not diaphoretic.  HENT:     Head:     Comments: Short styled hair. Eyes:     General: No scleral icterus.    Conjunctiva/sclera: Conjunctivae normal.     Comments: Brown eyes.  Neurological:     Mental Status: She is alert and oriented to person, place, and time.  Psychiatric:        Behavior: Behavior normal.        Thought Content: Thought content normal.        Judgment: Judgment normal.      No visits with results within 3 Day(s) from this visit.  Latest known visit with results is:  Appointment on 11/06/2019  Component Date Value Ref Range Status  . Path Review 11/06/2019 Blood smear is reviewed.   Corrected   Comment: Absolute leukopenia, with ANC of 1300 per microliter. Mature neutrophils show appropriate nuclear lobation and cytoplasmic granularity. No increase in circulating  blasts. Normal RBC parameters and morphology. Normal platelet count and morphology. The cause for the patient's leukopenia is unclear from morphologic review. Potential secondary causes of cytopenias should be considered, including nutritional deficiencies, medication/toxin effect, viral illness, or autoimmune/rheumatologic disease. If there is concern for an underlying marrow disorder, bone marrow biopsy and/or flow cytometry may provide a more comprehensive assessment. Clinical correlation is recommended. Reviewed by Kathi Simpers, M.D. Performed at [facility], [address] [place], [place] [address] CORRECTED ON 11/01 AT 1517: PREVIOUSLY REPORTED AS Cytospin of peritoneal fluid is reviewed. No malignancy identified. Reactive mesothelial cells and chronic inflammatory cells. Less than 2 neutrophil                          s per microliter. Reviewed by Kathi Simpers, M.D.   . WBC 11/06/2019 2.7* 4.0 - 10.5 K/uL Final  . RBC 11/06/2019 4.14  3.87 - 5.11 MIL/uL Final  . Hemoglobin 11/06/2019 12.9  12.0 - 15.0 g/dL Final  . HCT 11/06/2019 39.5  36 - 46 % Final  . MCV 11/06/2019 95.4  80.0 - 100.0 fL Final  . MCH 11/06/2019 31.2  26.0 - 34.0 pg Final  . MCHC 11/06/2019 32.7  30.0 - 36.0 g/dL Final  . RDW 11/06/2019 14.4  11.5 - 15.5 % Final  . Platelets 11/06/2019 311  150 - 400 K/uL Final  . nRBC 11/06/2019 0.0  0.0 - 0.2 % Final  . Neutrophils Relative % 11/06/2019 49  % Final  . Neutro Abs 11/06/2019 1.3* 1.7 - 7.7 K/uL Final  . Lymphocytes Relative 11/06/2019 42  % Final  . Lymphs Abs 11/06/2019 1.2  0.7 - 4.0 K/uL Final  . Monocytes Relative 11/06/2019 8  % Final  . Monocytes Absolute 11/06/2019 0.2  0.1 - 1.0 K/uL Final  . Eosinophils Relative 11/06/2019 1  % Final  . Eosinophils Absolute 11/06/2019 0.0  0.0 - 0.5 K/uL Final  . Basophils Relative 11/06/2019 0  % Final  . Basophils Absolute 11/06/2019 0.0  0.0 - 0.1 K/uL Final  . Immature Granulocytes 11/06/2019 0  % Final  . Abs Immature Granulocytes 11/06/2019 0.00  0.00 - 0.07 K/uL Final   Performed at [facility], [address], [place], [place] [address]    Assessment:  Darlene Chen is a 69 y.o. female with benign ethnic leukopenia/constitutional neutropenia. WBC has ranged between 2800 - 4400 since at least 03/12/2014 (no prior CBCs available).  Work-up on 06/28/2017 revealed a WBC 4,200, ANC 2,300, hemoglobin 13.7, hematocrit 40.9, platelets 382,000. B12 was 257 (low).  Normal studies included:  folate (12.8), HIV testing, hepatitis C antibody, hepatitis B core antibody total.  Peripheral smear was unremarkable.  Normal labs on 11/10/2018: folate (10.2), copper (122), and ANA.  She also has a history of pancreatic lesion initially seen on a abdomen CT in 05/2016.  MRCP MRI on 11/30/2017 revealed a septated cyst on the body of the pancreas, 1.7cm in  maximum dimension. Additionally, there were liver and right kidney cysts, with mild hepatic steatosis. Abdominal MRI on 07/04/2018 revealed stable small cystic lesions in the pancreas.  MRCP MRI on 11/14/2018 revealed stable small pancreatic cystic lesions, largest in the pancreatic body measuring 1.7 cm.  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 Recommendation included continued follow-up by MRI in 6 months.  Abdomen MRI on 05/22/2019 revealed scattered pancreatic cysts, including a dominant 14 mm cystic lesion in  the pancreatic tail, likely reflecting a pseudocyst or side branch IPMN.  Continued annual follow-up was suggested.    Abdominal MRI with an without contrast on 11/17/2019 revealed a stable septated 1.7 cm cystic lesion in the pancreatic body communicating with the nondilated main pancreatic duct, most c/w a side-branch IPMN. There were no high risk MRI features. Annual follow-up MRI abdomen without and with IV contrast was recommended until 5 years stability demonstrated. There was no acute abnormality. There was a tiny anterior gallbladder wall polyp, not clearly seen on prior MRI, requiring no follow-up. There was no cholelithiasis. There was diffuse colonic diverticulosis.  CA 19-9 has been followed: 52 on 05/16/2019, 47 on 07/05/2019, and 68 on 11/06/2019.  She has B12 deficiency.  B12 was 257 on 06/28/2017, 549 on 11/10/2018, 1763 on 07/05/2019, and 2538 on 11/06/2019.  She is on oral B12.  She has been off her oral B12.  Folate was 8.5 on 11/06/2019.  Symptomatically, she feels "ok". She is taking vitamin B12 almost everyday but does not know what the dose. She is postponing her knee replacement to the Spring because her sister recently passed away.  Plan: 1.   Review labs from 11/06/2019. 2.   Mild leukopenia             CBCs prior to 2016 are unavailable.  Hematocrit 39.5.  Hemoglobin 12.9.  Platelets 311,000.  WBC 2700 (ANC 1300).  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                         No increased risk of infections.                         Set point for circulating neutrophils lower.  Associated with Duffy null RBC type.                         No treatment required.             Normal labs included: folate, copper and ANA.              Peripheral smear in 2019 revealed no abnormality.             No medications have been implicated.             She denies any symptoms, infections, gingivitis, or B symptoms.  Exam reveals no adenopathy or hepatosplenomegaly.  Continue to monitor, 3.   B12 deficiency             B12 was 257 on 06/28/2017 and 2538 on 11/06/2019.    B12 goal is 400.             Patient is unaware of her B12 dose.   Discuss plan to adjust dose.  Folate was 8.5 on 11/06/2019. 4.   Pancreatic lesion             Abdomen MRI on 05/22/2019 revealed scattered pancreatic cysts.    There was a dominant 14 mm cystic lesion in the pancreatic tail (pseudocyst or side branch IPMN).  Abdominal MRI on 11/17/2019 revealed a stable septated 1.7 cm cystic lesion in the pancreatic  body communicating with the nondilated main pancreatic duct, most c/w a side-branch IPMN.    There were no high risk MRI features.   Anticipate annual follow-up MRI abdomen without and with IV contrast. 5.   Planned bilateral knee surgery  She was scheduled for total knee replacement on 12/19/2019 in [place].   Surgery postponed until the spring.  She notes no prior surgery with issues of infection (or low counts).  Plan for follow-up with [facility] benign hematology prior to surgery. 6.   RN:  Call patient at home to determine current B12 dose and plan for adjustment. 7.   RN:  Follow-up with Mariea Clonts re: GI. 8.   RTC in 6 weeks for labs (B12). 9.   RTC in 3 months for MD assessment and +/-  labs.  I discussed the assessment and treatment plan with the patient.  The patient was provided an opportunity to ask questions and all were answered.  The patient agreed with the plan and demonstrated an understanding of the instructions.  The patient was advised to call back if the symptoms worsen or if the condition fails to improve as anticipated.   Lequita Asal, MD, PhD    12/05/2019, 4:14 PM  I, Evert Kohl, am acting as a Education administrator for Lequita Asal, MD.  I, Raymore Mike Gip, MD, have reviewed the above documentation for accuracy and completeness, and I agree with the above.

## 2019-12-05 ENCOUNTER — Encounter: Payer: Self-pay | Admitting: Hematology and Oncology

## 2019-12-05 ENCOUNTER — Inpatient Hospital Stay (HOSPITAL_BASED_OUTPATIENT_CLINIC_OR_DEPARTMENT_OTHER): Payer: Medicare HMO | Admitting: Hematology and Oncology

## 2019-12-05 ENCOUNTER — Other Ambulatory Visit: Payer: Self-pay

## 2019-12-05 VITALS — BP 131/80 | HR 70 | Temp 96.4°F | Resp 18 | Wt 224.9 lb

## 2019-12-05 DIAGNOSIS — D709 Neutropenia, unspecified: Secondary | ICD-10-CM

## 2019-12-05 DIAGNOSIS — E538 Deficiency of other specified B group vitamins: Secondary | ICD-10-CM | POA: Diagnosis not present

## 2019-12-05 DIAGNOSIS — D72819 Decreased white blood cell count, unspecified: Secondary | ICD-10-CM | POA: Diagnosis not present

## 2019-12-05 DIAGNOSIS — K862 Cyst of pancreas: Secondary | ICD-10-CM

## 2019-12-05 NOTE — Patient Instructions (Signed)
  Patient to call with current B12 dose.  Stop B12 for 2 weeks then begin decreased dose per the clinic nurse.

## 2019-12-07 ENCOUNTER — Other Ambulatory Visit: Payer: Self-pay

## 2019-12-07 DIAGNOSIS — D709 Neutropenia, unspecified: Secondary | ICD-10-CM

## 2019-12-07 DIAGNOSIS — K869 Disease of pancreas, unspecified: Secondary | ICD-10-CM

## 2019-12-08 ENCOUNTER — Telehealth: Payer: Self-pay

## 2019-12-08 NOTE — Telephone Encounter (Addendum)
Referral sent to [facility], Dr. Jola Schmidt. Called and notified Darlene Chen that [place] will contact her with the appointment details.

## 2019-12-12 ENCOUNTER — Other Ambulatory Visit: Payer: Self-pay

## 2020-01-18 ENCOUNTER — Other Ambulatory Visit: Payer: Self-pay

## 2020-01-18 ENCOUNTER — Inpatient Hospital Stay: Payer: Medicare HMO | Attending: Hematology and Oncology

## 2020-01-18 DIAGNOSIS — E538 Deficiency of other specified B group vitamins: Secondary | ICD-10-CM | POA: Insufficient documentation

## 2020-01-18 LAB — VITAMIN B12: Vitamin B-12: 525 pg/mL (ref 180–914)

## 2020-03-06 ENCOUNTER — Inpatient Hospital Stay: Payer: Medicare HMO | Attending: Hematology and Oncology

## 2020-03-21 ENCOUNTER — Other Ambulatory Visit: Payer: Self-pay

## 2020-03-21 ENCOUNTER — Inpatient Hospital Stay: Payer: Medicare HMO | Attending: Hematology and Oncology

## 2020-03-21 DIAGNOSIS — E538 Deficiency of other specified B group vitamins: Secondary | ICD-10-CM | POA: Insufficient documentation

## 2020-03-21 DIAGNOSIS — D709 Neutropenia, unspecified: Secondary | ICD-10-CM | POA: Insufficient documentation

## 2020-03-21 DIAGNOSIS — D538 Other specified nutritional anemias: Secondary | ICD-10-CM | POA: Diagnosis not present

## 2020-03-21 DIAGNOSIS — K869 Disease of pancreas, unspecified: Secondary | ICD-10-CM

## 2020-03-21 DIAGNOSIS — K862 Cyst of pancreas: Secondary | ICD-10-CM | POA: Insufficient documentation

## 2020-03-21 DIAGNOSIS — R978 Other abnormal tumor markers: Secondary | ICD-10-CM | POA: Diagnosis not present

## 2020-03-22 LAB — CANCER ANTIGEN 19-9: CA 19-9: 74 U/mL — ABNORMAL HIGH (ref 0–35)

## 2020-05-23 ENCOUNTER — Other Ambulatory Visit: Payer: Self-pay

## 2020-05-23 ENCOUNTER — Ambulatory Visit
Admission: RE | Admit: 2020-05-23 | Discharge: 2020-05-23 | Disposition: A | Discharge status: Home / Self Care | Payer: Medicare HMO | Source: Ambulatory Visit | Attending: Hematology and Oncology | Admitting: Hematology and Oncology

## 2020-05-23 DIAGNOSIS — K862 Cyst of pancreas: Secondary | ICD-10-CM | POA: Diagnosis present

## 2020-05-23 DIAGNOSIS — D709 Neutropenia, unspecified: Secondary | ICD-10-CM

## 2020-05-23 MED ORDER — GADOBUTROL 1 MMOL/ML IV SOLN
9.0000 mL | Freq: Once | INTRAVENOUS | Status: AC | PRN
  Administered 2020-05-23: 9 mL via INTRAVENOUS

## 2020-05-27 ENCOUNTER — Ambulatory Visit: Payer: Medicare HMO | Admitting: Hematology and Oncology

## 2020-05-27 ENCOUNTER — Other Ambulatory Visit: Payer: Medicare HMO

## 2020-05-27 ENCOUNTER — Ambulatory Visit: Payer: Medicare HMO | Admitting: Nurse Practitioner

## 2020-05-29 ENCOUNTER — Inpatient Hospital Stay: Payer: Medicare HMO | Attending: Oncology

## 2020-05-29 ENCOUNTER — Inpatient Hospital Stay (HOSPITAL_BASED_OUTPATIENT_CLINIC_OR_DEPARTMENT_OTHER): Payer: Medicare HMO | Admitting: Oncology

## 2020-05-29 ENCOUNTER — Other Ambulatory Visit: Payer: Self-pay

## 2020-05-29 VITALS — BP 126/80 | HR 72 | Temp 96.9°F | Resp 18 | Wt 227.1 lb

## 2020-05-29 DIAGNOSIS — K7689 Other specified diseases of liver: Secondary | ICD-10-CM | POA: Diagnosis not present

## 2020-05-29 DIAGNOSIS — Z7982 Long term (current) use of aspirin: Secondary | ICD-10-CM | POA: Diagnosis not present

## 2020-05-29 DIAGNOSIS — E538 Deficiency of other specified B group vitamins: Secondary | ICD-10-CM | POA: Insufficient documentation

## 2020-05-29 DIAGNOSIS — K862 Cyst of pancreas: Secondary | ICD-10-CM

## 2020-05-29 DIAGNOSIS — Z791 Long term (current) use of non-steroidal anti-inflammatories (NSAID): Secondary | ICD-10-CM | POA: Diagnosis not present

## 2020-05-29 DIAGNOSIS — Z79899 Other long term (current) drug therapy: Secondary | ICD-10-CM | POA: Diagnosis not present

## 2020-05-29 DIAGNOSIS — Z7984 Long term (current) use of oral hypoglycemic drugs: Secondary | ICD-10-CM | POA: Diagnosis not present

## 2020-05-29 DIAGNOSIS — D709 Neutropenia, unspecified: Secondary | ICD-10-CM

## 2020-05-29 DIAGNOSIS — E119 Type 2 diabetes mellitus without complications: Secondary | ICD-10-CM | POA: Diagnosis not present

## 2020-05-29 DIAGNOSIS — I10 Essential (primary) hypertension: Secondary | ICD-10-CM | POA: Diagnosis not present

## 2020-05-29 DIAGNOSIS — R978 Other abnormal tumor markers: Secondary | ICD-10-CM | POA: Diagnosis not present

## 2020-05-29 DIAGNOSIS — E7800 Pure hypercholesterolemia, unspecified: Secondary | ICD-10-CM | POA: Insufficient documentation

## 2020-05-29 DIAGNOSIS — N281 Cyst of kidney, acquired: Secondary | ICD-10-CM | POA: Diagnosis not present

## 2020-05-29 LAB — COMPREHENSIVE METABOLIC PANEL
ALT: 15 U/L (ref 0–44)
AST: 20 U/L (ref 15–41)
Albumin: 4.2 g/dL (ref 3.5–5.0)
Alkaline Phosphatase: 60 U/L (ref 38–126)
Anion gap: 8 (ref 5–15)
BUN: 16 mg/dL (ref 8–23)
CO2: 26 mmol/L (ref 22–32)
Calcium: 9.3 mg/dL (ref 8.9–10.3)
Chloride: 104 mmol/L (ref 98–111)
Creatinine, Ser: 0.85 mg/dL (ref 0.44–1.00)
GFR, Estimated: >60 mL/min (ref >60)
Glucose, Bld: 99 mg/dL (ref 70–99)
Potassium: 3.5 mmol/L (ref 3.5–5.1)
Sodium: 138 mmol/L (ref 135–145)
Total Bilirubin: 0.5 mg/dL (ref 0.3–1.2)
Total Protein: 7.8 g/dL (ref 6.5–8.1)

## 2020-05-29 LAB — CBC WITH DIFFERENTIAL/PLATELET
Abs Immature Granulocytes: 0.01 10*3/uL (ref 0.00–0.07)
Basophils Absolute: 0 10*3/uL (ref 0.0–0.1)
Basophils Relative: 0 %
Eosinophils Absolute: 0 10*3/uL (ref 0.0–0.5)
Eosinophils Relative: 1 %
HCT: 38.7 % (ref 36.0–46.0)
Hemoglobin: 12.6 g/dL (ref 12.0–15.0)
Immature Granulocytes: 0 %
Lymphocytes Relative: 52 %
Lymphs Abs: 1.6 10*3/uL (ref 0.7–4.0)
MCH: 31.2 pg (ref 26.0–34.0)
MCHC: 32.6 g/dL (ref 30.0–36.0)
MCV: 95.8 fL (ref 80.0–100.0)
Monocytes Absolute: 0.4 10*3/uL (ref 0.1–1.0)
Monocytes Relative: 12 %
Neutro Abs: 1.1 10*3/uL — ABNORMAL LOW (ref 1.7–7.7)
Neutrophils Relative %: 35 %
Platelets: 294 10*3/uL (ref 150–400)
RBC: 4.04 MIL/uL (ref 3.87–5.11)
RDW: 14.6 % (ref 11.5–15.5)
WBC: 3 10*3/uL — ABNORMAL LOW (ref 4.0–10.5)
nRBC: 0 % (ref 0.0–0.2)

## 2020-05-29 LAB — FOLATE: Folate: 12.7 ng/mL (ref >5.9)

## 2020-05-30 LAB — CANCER ANTIGEN 19-9: CA 19-9: 64 U/mL — ABNORMAL HIGH (ref 0–35)

## 2020-05-30 NOTE — Progress Notes (Signed)
Hematology/Oncology Consult note [facility]  [phone] [phone]  Patient Care Team: Sharyne Peach, MD as PCP - General (Family Medicine)   Name of the patient: Darlene Chen  [id number]  January 25, 1950   Date of visit: 05/30/20  Diagnosis- 1.  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  Pancreatic cyst  Chief complaint/ Reason for visit-routine follow-up of neutropenia and pancreatic cyst  Heme/Onc history: Patient is a 70 year old female who was referred for neutropenia.  Rulo fluctuates between 1.6-1.9 since 2018.  B12 folate HIV hep C normal.  Patient had MRI abdomen done in the past in 2019 and was found to have a 1.7 cm cyst in the pancreatic bodyThis has been subsequently followed and has remained stable without requiring any invasive intervention.  MRI also shows kidney and liver cysts  Interval history-patient is currently doing well and denies any specific complaints at this time  ECOG PS- 1 Pain scale- 0  Review of systems- Review of Systems  Constitutional: Negative for chills, fever, malaise/fatigue and weight loss.  HENT: Negative for congestion, ear discharge and nosebleeds.   Eyes: Negative for blurred vision.  Respiratory: Negative for cough, hemoptysis, sputum production, shortness of breath and wheezing.   Cardiovascular: Negative for chest pain, palpitations, orthopnea and claudication.  Gastrointestinal: Negative for abdominal pain, blood in stool, constipation, diarrhea, heartburn, melena, nausea and vomiting.  Genitourinary: Negative for dysuria, flank pain, frequency, hematuria and urgency.  Musculoskeletal: Negative for back pain, joint pain and myalgias.  Skin: Negative for rash.  Neurological: Negative for dizziness, tingling, focal weakness, seizures, weakness and headaches.  Endo/Heme/Allergies: Does not bruise/bleed easily.  Psychiatric/Behavioral: Negative for depression and suicidal  ideas. The patient does not have insomnia.        Allergies  Allergen Reactions  . Colesevelam Nausea Only  . Biaxin [Clarithromycin] Hives  . Niacin Other (See Comments)    Burning from inside   . Statins Other (See Comments)    Muscle/joint pain.  . Sulfa Antibiotics Hives  . Sulfur Dioxide   . Tramadol Other (See Comments)    Unsure of exact reaction type  . Ezetimibe Other (See Comments)    Muscle/joint pain     Past Medical History:  Diagnosis Date  . Diabetes mellitus without complication ([place])   . High cholesterol   . Hypertension   . Neutropenia [facility])      Past Surgical History:  Procedure Laterality Date  . arthroscopic knee surgery  on left 2000    . BREAST BIOPSY Right 2019   bx/clip-neg  . LEFT HEART CATH AND CORONARY ANGIOGRAPHY N/A 02/09/2017   Procedure: LEFT HEART CATH AND CORONARY ANGIOGRAPHY;  Surgeon: Yolonda Kida, MD;  Location: [place] [facility];  Service: Cardiovascular;  Laterality: N/A;  . REDUCTION MAMMAPLASTY Bilateral    1990  . TUBAL LIGATION  1985  . UMBILICAL HERNIA REPAIR  2007    Social History   Socioeconomic History  . Marital status: Married    Spouse name: Not on file  . Number of children: Not on file  . Years of education: Not on file  . Highest education level: Not on file  Occupational History  . Not on file  Tobacco Use  . Smoking status: Never Smoker  . Smokeless tobacco: Never Used  Vaping Use  . Vaping Use: Never used  Substance and Sexual Activity  . Alcohol use: No  . Drug use: No  . Sexual activity: Yes  Other  Topics Concern  . Not on file  Social History Narrative  . Not on file   Social Determinants of Health   Financial Resource Strain: Not on file  Food Insecurity: Not on file  Transportation Needs: Not on file  Physical Activity: Not on file  Stress: Not on file  Social Connections: Not on file  Intimate Partner Violence: Not on file    Family History  Problem Relation Age of  Onset  . Breast cancer Sister 42     Current Outpatient Medications:  .  amLODipine (NORVASC) 10 MG tablet, Take 10 mg by mouth daily., Disp: , Rfl:  .  ammonium lactate (LAC-HYDRIN) 12 % lotion, Apply topically., Disp: , Rfl:  .  aspirin EC 81 MG tablet, Take 81 mg by mouth daily., Disp: , Rfl:  .  cycloSPORINE (RESTASIS) 0.05 % ophthalmic emulsion, Place 1 drop into both eyes 2 (two) times daily as needed. , Disp: , Rfl:  .  diclofenac sodium (VOLTAREN) 1 % GEL, Apply 1 application topically as needed., Disp: , Rfl:  .  Evolocumab with Infusor ([place]) 420 MG/3.5ML SOCT, Inject into the skin., Disp: , Rfl:  .  fluocinonide cream (LIDEX) 6.72 %, Apply 1 application topically 2 (two) times daily as needed. For eczema, Disp: , Rfl: 0 .  hydrochlorothiazide (HYDRODIURIL) 12.5 MG tablet, Take 12.5 mg by mouth daily., Disp: , Rfl:  .  metFORMIN (GLUCOPHAGE) 500 MG tablet, Take 500 mg by mouth 2 (two) times daily., Disp: , Rfl:  .  metoprolol succinate (TOPROL-XL) 25 MG 24 hr tablet, Take 25 mg by mouth daily., Disp: , Rfl:  .  tiZANidine (ZANAFLEX) 4 MG tablet, Take 4 mg by mouth 3 (three) times daily as needed., Disp: , Rfl:  .  LORazepam (ATIVAN) 0.5 MG tablet, Take 1 tab 15-20 minutes prior to procedure. (Patient not taking: No sig reported), Disp: , Rfl:   Physical exam:  Vitals:   05/29/20 1336  BP: 126/80  Pulse: 72  Resp: 18  Temp: (!) 96.9 F (36.1 C)  TempSrc: Tympanic  SpO2: 99%  Weight: 227 lb 1.2 oz (103 kg)   Physical Exam Constitutional:      General: She is not in acute distress. Cardiovascular:     Rate and Rhythm: Normal rate and regular rhythm.  Pulmonary:     Effort: Pulmonary effort is normal.     Breath sounds: Normal breath sounds.  Skin:    General: Skin is warm and dry.  Neurological:     Mental Status: She is alert and oriented to person, place, and time.      CMP Latest Ref Rng & Units 05/29/2020  Glucose 70 - 99 mg/dL 99  BUN 8  - 23 mg/dL 16  Creatinine 0.44 - 1.00 mg/dL 0.85  Sodium 135 - 145 mmol/L 138  Potassium 3.5 - 5.1 mmol/L 3.5  Chloride 98 - 111 mmol/L 104  CO2 22 - 32 mmol/L 26  Calcium 8.9 - 10.3 mg/dL 9.3  Total Protein 6.5 - 8.1 g/dL 7.8  Total Bilirubin 0.3 - 1.2 mg/dL 0.5  Alkaline Phos 38 - 126 U/L 60  AST 15 - 41 U/L 20  ALT 0 - 44 U/L 15   CBC Latest Ref Rng & Units 05/29/2020  WBC 4.0 - 10.5 K/uL 3.0(L)  Hemoglobin 12.0 - 15.0 g/dL 12.6  Hematocrit 36.0 - 46.0 % 38.7  Platelets 150 - 400 K/uL 294    No images are attached to the  encounter.  MR ABDOMEN MRCP W WO CONTAST  Result Date: 05/24/2020 CLINICAL DATA:  70 year old female with history of cystic lesion of the pancreas. Follow-up study. EXAM: MRI ABDOMEN WITHOUT AND WITH CONTRAST (INCLUDING MRCP) TECHNIQUE: Multiplanar multisequence MR imaging of the abdomen was performed both before and after the administration of intravenous contrast. Heavily T2-weighted images of the biliary and pancreatic ducts were obtained, and three-dimensional MRCP images were rendered by post processing. CONTRAST:  81m GADAVIST GADOBUTROL 1 MMOL/ML IV SOLN COMPARISON:  Multiple priors, most recently abdominal MRI 11/17/2019. FINDINGS: Lower chest: Cardiomegaly. Hepatobiliary: Several small T1 hypointense, T2 hyperintense, nonenhancing lesions are noted throughout the liver, largest of which is in the central aspect of segment 6 (axial image 17 of series 4) measuring 1.1 cm, compatible with small simple cysts. No other suspicious hepatic lesions. No intra or extrahepatic biliary ductal dilatation noted on MRCP images. In the dependent portion of the gallbladder there is some amorphous mildly T1 hyperintense and T2 hypointense material, compatible with biliary sludge. Gallbladder is not distended. Gallbladder wall thickness is normal. No pericholecystic fluid or surrounding inflammatory changes. Pancreas: 3 small pancreatic lesions are again noted in the body and tail of  the pancreas, demonstrating low T1 signal intensity and high T2 signal intensity without internal enhancement. The largest of these has a thin internal septation and is in the body of the pancreas (axial image 16 of series 9) measuring 1.7 x 1.4 cm, unchanged. The other smaller lesions are also unchanged. No new suspicious appearing pancreatic mass. No pancreatic ductal dilatation noted on MRCP images. These lesions do not clearly communicate with the main pancreatic duct. No peripancreatic fluid collections or inflammatory changes. Spleen:  Unremarkable. Adrenals/Urinary Tract: Subcentimeter T1 hypointense, T2 hyperintense, nonenhancing lesions in both kidneys, compatible with tiny simple cysts. No suspicious renal lesions. No hydroureteronephrosis in the visualized portions of the abdomen. Bilateral adrenal glands are normal in appearance. Stomach/Bowel: Visualized portions are unremarkable. Vascular/Lymphatic: No aneurysm identified in the visualized abdominal vasculature. No lymphadenopathy noted in the abdomen. Other: No significant volume of ascites noted in the visualized portions of the peritoneal cavity. Musculoskeletal: No aggressive appearing osseous lesions are noted in the visualized portions of the skeleton. IMPRESSION: 1. Small cystic lesions in the pancreas, stable compared to the prior study, favored to represent either side branch ectasia or small side branch IPMN (intraductal papillary mucinous neoplasms). Their stability is reassuring, but continued attention on follow-up abdominal MRI with and without IV gadolinium is recommended in 1 year to ensure continued stability. This recommendation follows ACR consensus guidelines: Management of Incidental Pancreatic Cysts: A White Paper of the ACR Incidental Findings Committee. [id number];20:947-096 2. Cardiomegaly. 3. Additional incidental findings, as above. Electronically Signed   By: DVinnie LangtonM.D.   On: 05/24/2020 11:32      Assessment and plan- Patient is a 70y.o. female who is here for follow-up of following issues:  1. Chronic neutropenia patient's white cell count has been fluctuating between 2.7-3.2 at least since June 2020 and her AChestnutfluctuates between 1-1.3.  Presently it is 1.1.  No evidence of anemia or thrombocytopenia.  Given the stability of counts this does not require any bone marrow biopsy at this time.  Repeat CBC with differential in 6 months in 1 year and I will see her back in 1 year  2.  Pancreatic cyst:Patient noted to have 3 small pancreatic lesions in the body and tail of pancreas the largest of which measures 1.7  x 1.4 cm and remains unchanged.  No suspicious appearing pancreatic mass.  This could represent sidebranch ectasia or small sidebranch IPMN.  Continue yearly MRIs at this time.  Patient is also aware that she has simple cysts in the liver as well as kidneys   Visit Diagnosis 1. 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 B12 deficiency   3. Cyst of pancreas      Dr. Randa Evens, MD, MPH [facility] at [facility] [phone] 05/30/2020 9:52 AM

## 2020-06-18 ENCOUNTER — Other Ambulatory Visit: Payer: Self-pay

## 2020-06-18 ENCOUNTER — Ambulatory Visit: Payer: Medicare HMO | Attending: Orthopedic Surgery | Admitting: Physical Therapy

## 2020-06-18 ENCOUNTER — Encounter: Payer: Self-pay | Admitting: Physical Therapy

## 2020-06-18 DIAGNOSIS — M25612 Stiffness of left shoulder, not elsewhere classified: Secondary | ICD-10-CM | POA: Diagnosis present

## 2020-06-18 DIAGNOSIS — Z7409 Other reduced mobility: Secondary | ICD-10-CM | POA: Diagnosis present

## 2020-06-18 DIAGNOSIS — R531 Weakness: Secondary | ICD-10-CM | POA: Diagnosis present

## 2020-06-18 DIAGNOSIS — R6889 Other general symptoms and signs: Secondary | ICD-10-CM | POA: Diagnosis present

## 2020-06-18 DIAGNOSIS — M25512 Pain in left shoulder: Secondary | ICD-10-CM | POA: Diagnosis present

## 2020-06-18 NOTE — Therapy (Signed)
[place] [address] [place], [place], [address] Phone: [phone]   Fax:  [phone]  Physical Therapy Evaluation  Patient Details  Name: Darlene Chen MRN: [id number] Date of Birth: 26-Dec-1950 Referring Provider (PT): Johna Sheriff, MD   Encounter Date: 06/18/2020   PT End of Session - 06/18/20 1247     Visit Number 1    Number of Visits 24    Date for PT Re-Evaluation 09/10/20    Authorization - Visit Number 1    Authorization - Number of Visits 10    PT Start Time 0931    PT Stop Time 1029    PT Time Calculation (min) 58 min    Activity Tolerance Patient tolerated treatment well;Patient limited by pain    Behavior During Therapy [facility] for tasks assessed/performed             Past Medical History:  Diagnosis Date   Diabetes mellitus without complication ([place])    High cholesterol    Hypertension    Neutropenia ([place])     Past Surgical History:  Procedure Laterality Date   arthroscopic knee surgery  on left 2000     BREAST BIOPSY Right 2019   bx/clip-neg   LEFT HEART CATH AND CORONARY ANGIOGRAPHY N/A 02/09/2017   Procedure: LEFT HEART CATH AND CORONARY ANGIOGRAPHY;  Surgeon: Yolonda Kida, MD;  Location: [place] [facility];  Service: Cardiovascular;  Laterality: N/A;   REDUCTION MAMMAPLASTY Bilateral    1990   TUBAL LIGATION  8786   UMBILICAL HERNIA REPAIR  2007    There were no vitals filed for this visit.    Subjective Assessment - 06/18/20 1100     Subjective Pt. presents to PT s/p R reverse shoulder replacement 06/11/2020. Pt. reports 2-3/10 pain.  No complaints of incision issues, and management of pain via ice and Tylenol. Pt reports not needing naproxen 500 mg at the time of the initial eval.  Pt. is currently sleeping in chair at home but has an adjustable bed.    Pertinent History Pt. states return to surgeon 06/25/20 for check up and possible stable removal. Pt. reports no  other surgeries in the last 5 years. However, plans on getting TKAs to both LE once the shoulder rehab has resolved. Pt is currently not active 2/2 bilat knee pain.  Pt. is retired and lives with husband.    Limitations House hold activities;Standing;Walking;Lifting    How long can you sit comfortably? Deferred to 2nd treament.    How long can you stand comfortably? Deferred to 2nd treament.    How long can you walk comfortably? Deferred to 2nd treament.    Diagnostic tests --    Patient Stated Goals Pt would like to return to independent house work and return to full functional activity.    Currently in Pain? 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      Precautions   Precautions Other (comment)   Avoid IR/EXT/ADD and IR/ADD     Restrictions   Weight Bearing Restrictions Yes    RUE Weight Bearing Non weight bearing      [place] residence      Prior Function   Level of Independence Independent      Cognition   Overall Cognitive Status Within Functional Limits for tasks assessed      Observation/Other Assessments   Skin Integrity Surgical incision R Shoulder      ROM / Strength   AROM / PROM / Strength PROM      PROM   Overall PROM  Due to pain   S/P replacement   PROM Assessment Site Shoulder;Elbow;Forearm;Wrist    Right/Left Shoulder Right    Right Shoulder Flexion 65  Degrees    Right Shoulder ABduction 72 Degrees    Right Shoulder External Rotation 15 Degrees             R elbow/ wrist AROM WNL.  No strength testing at this time.     Objective measurements completed on examination: See above findings.    See HEP  Manual: R Shoulder PROM Abd/Flex/ER in supine, with prolonged static stretching at pain-limiting end-range.   Access Code: [email or web address]     PT Education - 06/18/20 1242     Education provided Yes    Education Details See HEP, pain science and management, possible recovery timeline.    Person(s) Educated Patient    Methods Explanation;Demonstration;Tactile cues;Verbal cues;Handout    Comprehension Verbalized understanding;Returned demonstration              PT Short Term Goals - 06/18/20 1334       PT SHORT TERM GOAL #1   Title Pt will begin UE AROM exercises to facilitate independence with light ADLs (Feeding, grooming) per post-surgical protocol.    Baseline Unable to complete AROM    Time 3    Period Weeks    Status New    Target Date 07/09/20      PT SHORT TERM GOAL #2   Title Pt will display proper strength, posture, and confidence with RUE to discontinue sling for increase access to independent ADL function.    Baseline Doned sling during all times of day/night besides HEP and grooming.    Time 3    Period Weeks    Status New    Target Date 07/09/20               PT Long Term Goals - 06/18/20 1327       PT LONG TERM GOAL #1   Title Pt will score a 58 on the FOTO to promote objective functional return to PLOF    Baseline 6/14: 28    Time 12    Period Weeks    Status New    Target Date 09/10/20      PT LONG TERM GOAL #2   Title Pt. will achieve R shoulder Flex+ABD AROM to 120 deg to promote independence with ADLs.    Baseline 06/18/20 abd: 72 deg./  flex: 65 deg.    Time 12    Period Weeks    Status New    Target Date  09/10/20      PT LONG TERM GOAL #3   Title Pt will improve R shoulder ER  AAROM to 30 deg to promote independence with ADLs.    Baseline 11 deg. on 6/14    Time 12    Period Weeks    Status New    Target Date 09/10/20      PT LONG TERM GOAL #4   Title Pt will achieve R shoulder MMT 4/5 to facilitate workload tolerance similar to ADLs    Baseline N/T    Time 12    Period Weeks    Status New    Target Date 09/10/20      PT LONG TERM GOAL #5   Title Pt will report <2/10 pain with reaching/lifting tasks to promote functional ability so that she can participate in household tasks.    Baseline 2/10 R shoulder pain currently at rest.    Time 12    Period Weeks    Status New    Target Date 09/10/20                    Plan - 06/18/20 1253     Clinical Impression Statement Pt. is a pleasant 70 y/o female s/p R reverse shoulder replacement 06/11/20. Pt. reports no complications, which is consistent with OP note. Pt arrived at the initial evaluation with a post-surgical protocol that states to avoid WBing, EXT/IR/ADD and IR/ADD, and complete submersion in water. Pt presents with PROM, strength, pain, functional deficits and a FOTO score of 28/58. Pt stated goals of returning to functional capacity with household items and return to travel. Treatment will be guided by surgical protocol/precaution provided from the surgical office. Pt. case presents as a moderate complexity and will benefit from treatment 2x week for 12 weeks to facilitate return maximal functional status for personal independence wellness.    Examination-Activity Limitations Hygiene/Grooming;Bathing;Self Feeding    Examination-Participation Restrictions Laundry;Cleaning;Driving;Meal Prep    Stability/Clinical Decision Making Evolving/Moderate complexity    Clinical Decision Making Moderate    Rehab Potential Good    Clinical Impairments Affecting Rehab Potential Negative: chronic nature of pain, comorbidities. Positive: motivated, family support    PT Frequency 2x / week    PT Duration 12  weeks    PT Treatment/Interventions Electrical Stimulation;Fluidtherapy;Moist Heat;Functional mobility training;Therapeutic activities;Therapeutic exercise;Patient/family education;Manual techniques;Scar mobilization;Passive range of motion;Balance training;Neuromuscular re-education;Cryotherapy;DME Instruction    PT Next Visit Plan Reassess ROM, HEP adherence, and pain mgmt.    PT Home Exercise Plan See HEP.  Access Code: [email or web address]     Consulted and Agree with Plan of Care Patient             Patient will benefit from skilled therapeutic intervention in order to improve the following deficits and impairments:  Decreased endurance, Decreased mobility, Decreased skin integrity, Hypomobility, Impaired sensation, Improper body mechanics, Increased edema, Decreased scar mobility, Decreased range of motion, Decreased activity tolerance, Decreased strength, Impaired UE functional use, Pain  Visit Diagnosis: Acute pain of left shoulder  Decreased ROM of left shoulder  Decreased strength, endurance, and mobility     Problem List Patient Active Problem List   Diagnosis Date Noted   Pancreatic cyst 11/21/2018   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[place]) 09/21/2018   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   Leucopenia 07/06/2018   B12 deficiency 07/06/2018   Fatty liver disease, nonalcoholic 41/74/0814   Onychomycosis 07/20/2017   Hypertensive disorder 12/03/2016   Intracranial aneurysm 12/03/2016   Family history of brain aneurysm 08/07/2016  Obstructive sleep apnea 06/17/2016   Hyperglycemia, unspecified 01/29/2016   Morbid obesity with BMI of 45.0-49.9, adult ([place]) 04/08/2015   Myalgia 11/15/2014   Costochondritis 04/11/2014   Osteopenia 04/11/2014   Prediabetes 02/15/2014   Hypercholesterolemia 09/26/2013   Degenerative arthritis of left knee 06/02/2013   Pura Spice, PT, DPT # [id number] Fara Olden SPT 06/18/2020, 2:57 PM  [facility] [address] [place], [place], [address] Phone: [phone]   Fax:  [phone]  Name: Mera Gunkel MRN: [id number] Date of Birth: [birth year]

## 2020-06-18 NOTE — Patient Instructions (Signed)
Access Code: [email or web address]  URL: [email or web address] Date: 06/18/2020 Prepared by: Dorcas Carrow  Exercises Seated Shoulder Flexion AAROM with Pulley Behind - 2 x daily - 7 x weekly - 1 sets - 10 reps Seated Shoulder Abduction AAROM with Pulley Behind - 2 x daily - 7 x weekly - 1 sets - 10 reps Seated Shoulder External Rotation AAROM with Dowel - 2 x daily - 7 x weekly - 1 sets - 10 reps Seated Scapular Retraction - 2 x daily - 7 x weekly - 1 sets - 10 reps Standing Elbow Flexion Extension AROM - 2 x daily - 7 x weekly - 1 sets - 10 reps Seated Shoulder Shrug Circles AROM Backward - 2 x daily - 7 x weekly - 1 sets - 10 reps

## 2020-06-20 ENCOUNTER — Ambulatory Visit: Payer: Medicare HMO | Admitting: Physical Therapy

## 2020-06-20 ENCOUNTER — Other Ambulatory Visit: Payer: Self-pay

## 2020-06-20 ENCOUNTER — Encounter: Payer: Self-pay | Admitting: Physical Therapy

## 2020-06-20 DIAGNOSIS — R531 Weakness: Secondary | ICD-10-CM

## 2020-06-20 DIAGNOSIS — M25612 Stiffness of left shoulder, not elsewhere classified: Secondary | ICD-10-CM

## 2020-06-20 DIAGNOSIS — M25512 Pain in left shoulder: Secondary | ICD-10-CM | POA: Diagnosis not present

## 2020-06-20 NOTE — Addendum Note (Signed)
Addended by: Pura Spice on: 06/20/2020 01:36 PM   Modules accepted: Orders

## 2020-06-20 NOTE — Therapy (Signed)
[facility] [address]. [place], [place], [address] Phone: [phone]   Fax:  [phone]  Physical Therapy Treatment  Patient Details  Name: Darlene Chen MRN: [id number] Date of Birth: 13-Apr-1950 Referring Provider (PT): Johna Sheriff, MD   Encounter Date: 06/20/2020   PT End of Session - 06/20/20 1223     Visit Number 2    Number of Visits 24    Date for PT Re-Evaluation 09/10/20    Authorization - Visit Number 2    Authorization - Number of Visits 10    PT Start Time 0941    PT Stop Time 1031    PT Time Calculation (min) 50 min    Activity Tolerance Patient tolerated treatment well;Patient limited by pain    Behavior During Therapy [facility] for tasks assessed/performed             Past Medical History:  Diagnosis Date   Diabetes mellitus without complication ([place])    High cholesterol    Hypertension    Neutropenia ([place])     Past Surgical History:  Procedure Laterality Date   arthroscopic knee surgery  on left 2000     BREAST BIOPSY Right 2019   bx/clip-neg   LEFT HEART CATH AND CORONARY ANGIOGRAPHY N/A 02/09/2017   Procedure: LEFT HEART CATH AND CORONARY ANGIOGRAPHY;  Surgeon: Yolonda Kida, MD;  Location: [place] [facility];  Service: Cardiovascular;  Laterality: N/A;   REDUCTION MAMMAPLASTY Bilateral    1990   TUBAL LIGATION  8850   UMBILICAL HERNIA REPAIR  2007    There were no vitals filed for this visit.   Subjective Assessment - 06/20/20 1220     Subjective Pt reported no significant change in pain or functional status from last treatment. Pt states that pain similar to previous tx (3/10) and moderate difficult getting comfortable when sleeping. Pt stated that her sling causes minor neck pain on the opposite side form her operated shoulder.    Pertinent History Pt. states return to surgeon 06/25/20 for check up and possible stable removal. Pt. reports no other surgeries in the  last 5 years. However, plans on getting TKAs to both LE once the shoulder rehab has resolved. Pt is currently not active 2/2 bilat knee pain.  Pt. is retired and lives with husband.    Limitations House hold activities;Standing;Walking;Lifting    How long can you sit comfortably? Deferred to 2nd treament.    How long can you stand comfortably? Deferred to 2nd treament.    How long can you walk comfortably? Deferred to 2nd treament.    Patient Stated Goals Pt would like to return to independent house work and return to full functional activity.    Currently in Pain? Yes    Pain Score 3     Pain Orientation Right    Pain Descriptors / Indicators Discomfort;Aching    Pain Type Surgical pain    Pain Onset 1 to 4 weeks ago              Therapeutic Exercise:  Wall ladder 3x at start of treatment 3x and the end of treatment. 1 level improvement every time.  Standing R shoulder AAROM at stair railing with cloth to improve shoulder flex x6  Pulley's Flexion/abd x2 min each direction (reviewed HEP).   Manuel Therapy:  Supine R shoulder PROM with OP at end-range flexion, abd, and ER 34 mins   Light STM to R biceps during PROM.  10 min ice at the end of treatment to reduce inflammation.      PT Short Term Goals - 06/18/20 1334       PT SHORT TERM GOAL #1   Title Pt will begin UE AROM exercises to facilitate independence with light ADLs (Feeding, grooming) per post-surgical protocol.    Baseline Unable to complete AROM    Time 3    Period Weeks    Status New    Target Date 07/09/20      PT SHORT TERM GOAL #2   Title Pt will display proper strength, posture, and confidence with RUE to discontinue sling for increase access to independent ADL function.    Baseline Doned sling during all times of day/night besides HEP and grooming.    Time 3    Period Weeks    Status New    Target Date 07/09/20               PT Long Term Goals - 06/18/20 1327       PT LONG TERM  GOAL #1   Title Pt will score a 58 on the FOTO to promote objective functional return to PLOF    Baseline 6/14: 28    Time 12    Period Weeks    Status New    Target Date 09/10/20      PT LONG TERM GOAL #2   Title Pt. will achieve R shoulder Flex+ABD AROM to 120 deg to promote independence with ADLs.    Baseline 06/18/20 abd: 72 deg./  flex: 65 deg.    Time 12    Period Weeks    Status New    Target Date 09/10/20      PT LONG TERM GOAL #3   Title Pt will improve R shoulder ER AAROM to 30 deg to promote independence with ADLs.    Baseline 11 deg. on 6/14    Time 12    Period Weeks    Status New    Target Date 09/10/20      PT LONG TERM GOAL #4   Title Pt will achieve R shoulder MMT 4/5 to facilitate workload tolerance similar to ADLs    Baseline N/T    Time 12    Period Weeks    Status New    Target Date 09/10/20      PT LONG TERM GOAL #5   Title Pt will report <2/10 pain with reaching/lifting tasks to promote functional ability so that she can participate in household tasks.    Baseline 2/10 R shoulder pain currently at rest.    Time 12    Period Weeks    Status New    Target Date 09/10/20                   Plan - 06/20/20 1242     Clinical Impression Statement Pt. Presented to tx with 3/10 pain that stayed consistent throughout tx. Pt improved R shoulder flexion to 85 deg, ER to 20 deg., and Flexion to 75 deg. Pt also improved 6 steps on the wall ladder, which allowed her to see progress, even over a short amount of time. All tx was completed within the post-surgical protocol for best surgeon specific guidance. Pt. will continue to benefit from skilled physical therapy to progress POC to address remaining deficits to facilitate maximum functional capacity for optimal personal health and wellness for ADLs.    Examination-Activity Limitations Hygiene/Grooming;Bathing;Self Feeding    Examination-Participation Restrictions  Laundry;Cleaning;Driving;Meal Prep     Stability/Clinical Decision Making Evolving/Moderate complexity    Clinical Decision Making Moderate    Rehab Potential Good    Clinical Impairments Affecting Rehab Potential Negative: chronic nature of pain, comorbidities. Positive: motivated, family support    PT Frequency 2x / week    PT Duration 12 weeks    PT Treatment/Interventions Electrical Stimulation;Fluidtherapy;Moist Heat;Functional mobility training;Therapeutic activities;Therapeutic exercise;Patient/family education;Manual techniques;Scar mobilization;Passive range of motion;Balance training;Neuromuscular re-education;Cryotherapy;DME Instruction    PT Next Visit Plan Reassess ROM, HEP adherence, and pain mgmt.    PT Home Exercise Plan See HEP.  Access Code: [email or web address]     Consulted and Agree with Plan of Care Patient             Patient will benefit from skilled therapeutic intervention in order to improve the following deficits and impairments:  Decreased endurance, Decreased mobility, Decreased skin integrity, Hypomobility, Impaired sensation, Improper body mechanics, Increased edema, Decreased scar mobility, Decreased range of motion, Decreased activity tolerance, Decreased strength, Impaired UE functional use, Pain  Visit Diagnosis: Acute pain of left shoulder  Decreased ROM of left shoulder  Decreased strength, endurance, and mobility     Problem List Patient Active Problem List   Diagnosis Date Noted   Pancreatic cyst 11/21/2018   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[place]) 09/21/2018   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   Leucopenia 07/06/2018   B12 deficiency 07/06/2018   Fatty liver disease, nonalcoholic 42/35/3614   Onychomycosis 07/20/2017   Hypertensive disorder 12/03/2016   Intracranial aneurysm 12/03/2016   Family history of brain aneurysm 08/07/2016   Obstructive sleep apnea 06/17/2016   Hyperglycemia, unspecified 01/29/2016   Morbid obesity with BMI of 45.0-49.9, adult ([place]) 04/08/2015   Myalgia  11/15/2014   Costochondritis 04/11/2014   Osteopenia 04/11/2014   Prediabetes 02/15/2014   Hypercholesterolemia 09/26/2013   Degenerative arthritis of left knee 06/02/2013   Pura Spice, PT, DPT # [id number] Fara Olden, SPT 06/20/2020, 1:51 PM  [facility] [address] [place], [place], [address] Phone: [phone]   Fax:  [phone]  Name: Darlene Chen MRN: [id number] Date of Birth: 1950-09-06

## 2020-06-25 ENCOUNTER — Ambulatory Visit: Payer: Medicare HMO | Admitting: Physical Therapy

## 2020-06-25 ENCOUNTER — Other Ambulatory Visit: Payer: Self-pay

## 2020-06-25 ENCOUNTER — Encounter: Payer: Self-pay | Admitting: Physical Therapy

## 2020-06-25 DIAGNOSIS — R531 Weakness: Secondary | ICD-10-CM

## 2020-06-25 DIAGNOSIS — R6889 Other general symptoms and signs: Secondary | ICD-10-CM

## 2020-06-25 DIAGNOSIS — M25512 Pain in left shoulder: Secondary | ICD-10-CM

## 2020-06-25 DIAGNOSIS — M25612 Stiffness of left shoulder, not elsewhere classified: Secondary | ICD-10-CM

## 2020-06-25 NOTE — Therapy (Signed)
[facility] [address] [place], [place], [address] Phone: [phone]   Fax:  [phone]  Physical Therapy Treatment  Patient Details  Name: Darlene Chen MRN: [id number] Date of Birth: [birth year] Referring Provider (PT): Johna Sheriff, MD   Encounter Date: 06/25/2020   PT End of Session - 06/25/20 1700     Visit Number 3    Number of Visits 24    Date for PT Re-Evaluation 09/10/20    Authorization - Visit Number 3    Authorization - Number of Visits 10    PT Start Time 8250    PT Stop Time 1607    PT Time Calculation (min) 49 min    Activity Tolerance Patient tolerated treatment well    Behavior During Therapy [facility] for tasks assessed/performed             Past Medical History:  Diagnosis Date   Diabetes mellitus without complication ([place])    High cholesterol    Hypertension    Neutropenia ([place])     Past Surgical History:  Procedure Laterality Date   arthroscopic knee surgery  on left 2000     BREAST BIOPSY Right 2019   bx/clip-neg   LEFT HEART CATH AND CORONARY ANGIOGRAPHY N/A 02/09/2017   Procedure: LEFT HEART CATH AND CORONARY ANGIOGRAPHY;  Surgeon: Yolonda Kida, MD;  Location: [place] [facility];  Service: Cardiovascular;  Laterality: N/A;   REDUCTION MAMMAPLASTY Bilateral    1990   TUBAL LIGATION  5397   UMBILICAL HERNIA REPAIR  2007    There were no vitals filed for this visit.   Subjective Assessment - 06/25/20 1659     Subjective Pt reported to treatment with no pain and no complications over the weekend. Pt states that she returns to her surgeons office tomorrow for stable removal. Pt states that she believes that she is doing the pulley and cane assisted PROM wrong and would like to review those movements.    Pertinent History Pt. states return to surgeon 06/25/20 for check up and possible stable removal. Pt. reports no other surgeries in the last 5 years. However, plans on  getting TKAs to both LE once the shoulder rehab has resolved. Pt is currently not active 2/2 bilat knee pain.  Pt. is retired and lives with husband.    Limitations House hold activities;Standing;Walking;Lifting    How long can you sit comfortably? Deferred to 2nd treament.    How long can you stand comfortably? Deferred to 2nd treament.    How long can you walk comfortably? Deferred to 2nd treament.    Patient Stated Goals Pt would like to return to independent house work and return to full functional activity.    Currently in Pain? No/denies    Pain Onset 1 to 4 weeks ago              Therapeutic Exercise:   Wall ladder 5x at start of treatment 2 level improvement from last tx.    Standing R shoulder AAROM at stair railing with cloth to improve shoulder flex x12   Pulley's Flexion/abd x2 min each direction (reviewed HEP).     Manuel Therapy:   Supine R shoulder PROM with OP at end-range flexion, abd, and ER 34 mins   Light STM to R biceps during PROM.     10 min ice at the end of treatment to reduce inflammation.  ROM measurement:  L PROM Flex: 110 deg. Abd 90  deg.      PT Short Term Goals - 06/18/20 1334       PT SHORT TERM GOAL #1   Title Pt will begin UE AROM exercises to facilitate independence with light ADLs (Feeding, grooming) per post-surgical protocol.    Baseline Unable to complete AROM    Time 3    Period Weeks    Status New    Target Date 07/09/20      PT SHORT TERM GOAL #2   Title Pt will display proper strength, posture, and confidence with RUE to discontinue sling for increase access to independent ADL function.    Baseline Doned sling during all times of day/night besides HEP and grooming.    Time 3    Period Weeks    Status New    Target Date 07/09/20               PT Long Term Goals - 06/18/20 1327       PT LONG TERM GOAL #1   Title Pt will score a 58 on the FOTO to promote objective functional return to PLOF    Baseline  6/14: 28    Time 12    Period Weeks    Status New    Target Date 09/10/20      PT LONG TERM GOAL #2   Title Pt. will achieve R shoulder Flex+ABD AROM to 120 deg to promote independence with ADLs.    Baseline 06/18/20 abd: 72 deg./  flex: 65 deg.    Time 12    Period Weeks    Status New    Target Date 09/10/20      PT LONG TERM GOAL #3   Title Pt will improve R shoulder ER AAROM to 30 deg to promote independence with ADLs.    Baseline 11 deg. on 6/14    Time 12    Period Weeks    Status New    Target Date 09/10/20      PT LONG TERM GOAL #4   Title Pt will achieve R shoulder MMT 4/5 to facilitate workload tolerance similar to ADLs    Baseline N/T    Time 12    Period Weeks    Status New    Target Date 09/10/20      PT LONG TERM GOAL #5   Title Pt will report <2/10 pain with reaching/lifting tasks to promote functional ability so that she can participate in household tasks.    Baseline 2/10 R shoulder pain currently at rest.    Time 12    Period Weeks    Status New    Target Date 09/10/20                   Plan - 06/25/20 1705     Clinical Impression Statement HEP was reviewed with the pt and was able to correct poor mechanics during pulley's. Pt was able to improve PROM flex to 110 deg and abd to 90 deg. All treatment was done in coordination with surgeon provide protocol. Pt. will continue to benefit from skilled physical therapy to progress POC to address remaining deficits to facilitate maximum functional capacity for optimal personal health and wellness for ADLs.    Examination-Activity Limitations Hygiene/Grooming;Bathing;Self Feeding    Examination-Participation Restrictions Laundry;Cleaning;Driving;Meal Prep    Stability/Clinical Decision Making Evolving/Moderate complexity    Clinical Decision Making Moderate    Rehab Potential Good    Clinical Impairments Affecting Rehab Potential Negative: chronic nature  of pain, comorbidities. Positive: motivated,  family support    PT Frequency 2x / week    PT Duration 12 weeks    PT Treatment/Interventions Electrical Stimulation;Fluidtherapy;Moist Heat;Functional mobility training;Therapeutic activities;Therapeutic exercise;Patient/family education;Manual techniques;Scar mobilization;Passive range of motion;Balance training;Neuromuscular re-education;Cryotherapy;DME Instruction    PT Next Visit Plan Reassess ROM, HEP adherence, and pain mgmt.    PT Home Exercise Plan See HEP.  Access Code: [email or web address]     Consulted and Agree with Plan of Care Patient             Patient will benefit from skilled therapeutic intervention in order to improve the following deficits and impairments:  Decreased endurance, Decreased mobility, Decreased skin integrity, Hypomobility, Impaired sensation, Improper body mechanics, Increased edema, Decreased scar mobility, Decreased range of motion, Decreased activity tolerance, Decreased strength, Impaired UE functional use, Pain  Visit Diagnosis: Acute pain of left shoulder  Decreased ROM of left shoulder  Decreased strength, endurance, and mobility     Problem List Patient Active Problem List   Diagnosis Date Noted   Pancreatic cyst 11/21/2018   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[place]) 09/21/2018   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   Leucopenia 07/06/2018   B12 deficiency 07/06/2018   Fatty liver disease, nonalcoholic 16/10/9602   Onychomycosis 07/20/2017   Hypertensive disorder 12/03/2016   Intracranial aneurysm 12/03/2016   Family history of brain aneurysm 08/07/2016   Obstructive sleep apnea 06/17/2016   Hyperglycemia, unspecified 01/29/2016   Morbid obesity with BMI of 45.0-49.9, adult ([place]) 04/08/2015   Myalgia 11/15/2014   Costochondritis 04/11/2014   Osteopenia 04/11/2014   Prediabetes 02/15/2014   Hypercholesterolemia 09/26/2013   Degenerative arthritis of left knee 06/02/2013   Pura Spice, PT, DPT # [id number] Fara Olden SPT 06/25/2020, 5:17  PM  [facility] [address] [place], [place], [address] Phone: [phone]   Fax:  [phone]  Name: Darlene Chen MRN: [id number] Date of Birth: 1950/01/13

## 2020-06-27 ENCOUNTER — Encounter: Payer: Self-pay | Admitting: Physical Therapy

## 2020-06-27 ENCOUNTER — Ambulatory Visit: Payer: Medicare HMO | Admitting: Physical Therapy

## 2020-06-27 ENCOUNTER — Other Ambulatory Visit: Payer: Self-pay

## 2020-06-27 DIAGNOSIS — M25512 Pain in left shoulder: Secondary | ICD-10-CM | POA: Diagnosis not present

## 2020-06-27 DIAGNOSIS — R531 Weakness: Secondary | ICD-10-CM

## 2020-06-27 DIAGNOSIS — M25612 Stiffness of left shoulder, not elsewhere classified: Secondary | ICD-10-CM

## 2020-06-27 DIAGNOSIS — Z7409 Other reduced mobility: Secondary | ICD-10-CM

## 2020-06-27 NOTE — Therapy (Signed)
[facility] [address] [place], [place], [address] Phone: [phone]   Fax:  [phone]  Physical Therapy Treatment  Patient Details  Name: Darlene Chen MRN: [id number] Date of Birth: [birth year] Referring Provider (PT): Johna Sheriff, MD   Encounter Date: 06/27/2020   PT End of Session - 06/27/20 1418     Visit Number 4    Number of Visits 24    Date for PT Re-Evaluation 09/10/20    Authorization - Visit Number 4    Authorization - Number of Visits 10    PT Start Time 9678    PT Stop Time 1355    PT Time Calculation (min) 59 min    Activity Tolerance Patient tolerated treatment well    Behavior During Therapy [facility] for tasks assessed/performed             Past Medical History:  Diagnosis Date   Diabetes mellitus without complication ([place])    High cholesterol    Hypertension    Neutropenia ([place])     Past Surgical History:  Procedure Laterality Date   arthroscopic knee surgery  on left 2000     BREAST BIOPSY Right 2019   bx/clip-neg   LEFT HEART CATH AND CORONARY ANGIOGRAPHY N/A 02/09/2017   Procedure: LEFT HEART CATH AND CORONARY ANGIOGRAPHY;  Surgeon: Yolonda Kida, MD;  Location: [place] [facility];  Service: Cardiovascular;  Laterality: N/A;   REDUCTION MAMMAPLASTY Bilateral    1990   TUBAL LIGATION  9381   UMBILICAL HERNIA REPAIR  2007    There were no vitals filed for this visit.   Subjective Assessment - 06/27/20 1415     Subjective Pt presents to PT with mod increase in soreness, swelling, and pain 5/10. Pt stated that the increase in stiffness and pain was post check up yesterday. Pt stated that she did not ice yesterday which could cause the increase in swelling/stiffness/pain.    Pertinent History Pt. states return to surgeon 06/25/20 for check up and possible stable removal. Pt. reports no other surgeries in the last 5 years. However, plans on getting TKAs to both LE once  the shoulder rehab has resolved. Pt is currently not active 2/2 bilat knee pain.  Pt. is retired and lives with husband.    Limitations House hold activities;Standing;Walking;Lifting    How long can you sit comfortably? Deferred to 2nd treament.    How long can you stand comfortably? Deferred to 2nd treament.    How long can you walk comfortably? Deferred to 2nd treament.    Patient Stated Goals Pt would like to return to independent house work and return to full functional activity.    Currently in Pain? Yes    Pain Score 5     Pain Location Shoulder    Pain Orientation Right    Pain Descriptors / Indicators Aching;Tightness    Pain Type Other (Comment)   Surgical pain   Pain Onset 1 to 4 weeks ago              Therapeutic Exercise:   Wall ladder 5x at start of treatment able to reach same level at last tx. Min during the eccentric phase to control the return of the arm.     Standing R shoulder AAROM at stair railing with cloth to improve shoulder flex x20   Pulley's Flexion/abd x3 min each direction   Seated shoulder abd roll out AAROM. X30  Manual Therapy:   Supine R shoulder PROM with OP at end-range flexion, abd, and ER 19 mins   Light STM to R biceps during PROM.     10 min ice at the end of treatment to reduce inflammation.        PT Short Term Goals - 06/18/20 1334       PT SHORT TERM GOAL #1   Title Pt will begin UE AROM exercises to facilitate independence with light ADLs (Feeding, grooming) per post-surgical protocol.    Baseline Unable to complete AROM    Time 3    Period Weeks    Status New    Target Date 07/09/20      PT SHORT TERM GOAL #2   Title Pt will display proper strength, posture, and confidence with RUE to discontinue sling for increase access to independent ADL function.    Baseline Doned sling during all times of day/night besides HEP and grooming.    Time 3    Period Weeks    Status New    Target Date 07/09/20                PT Long Term Goals - 06/18/20 1327       PT LONG TERM GOAL #1   Title Pt will score a 58 on the FOTO to promote objective functional return to PLOF    Baseline 6/14: 28    Time 12    Period Weeks    Status New    Target Date 09/10/20      PT LONG TERM GOAL #2   Title Pt. will achieve R shoulder Flex+ABD AROM to 120 deg to promote independence with ADLs.    Baseline 06/18/20 abd: 72 deg./  flex: 65 deg.    Time 12    Period Weeks    Status New    Target Date 09/10/20      PT LONG TERM GOAL #3   Title Pt will improve R shoulder ER AAROM to 30 deg to promote independence with ADLs.    Baseline 11 deg. on 6/14    Time 12    Period Weeks    Status New    Target Date 09/10/20      PT LONG TERM GOAL #4   Title Pt will achieve R shoulder MMT 4/5 to facilitate workload tolerance similar to ADLs    Baseline N/T    Time 12    Period Weeks    Status New    Target Date 09/10/20      PT LONG TERM GOAL #5   Title Pt will report <2/10 pain with reaching/lifting tasks to promote functional ability so that she can participate in household tasks.    Baseline 2/10 R shoulder pain currently at rest.    Time 12    Period Weeks    Status New    Target Date 09/10/20                   Plan - 06/27/20 1425     Clinical Impression Statement Pt presented to tx with increase stiffness/pain(5/10)/warmth of the R shoulder. Pt reported decreased pain to 0/10, mild stiffness, and decrease warmth of the skin post skilled ther.ex. and manual tx. All treatment was done within surgical protocol. Pt. will continue to benefit from skilled physical therapy to progress POC to address remaining deficits to facilitate maximum functional capacity for optimal personal health and wellness for ADLs.    Examination-Activity Limitations  Hygiene/Grooming;Bathing;Self Feeding    Examination-Participation Restrictions Laundry;Cleaning;Driving;Meal Prep    Stability/Clinical Decision Making  Evolving/Moderate complexity    Clinical Decision Making Moderate    Rehab Potential Good    Clinical Impairments Affecting Rehab Potential Negative: chronic nature of pain, comorbidities. Positive: motivated, family support    PT Frequency 2x / week    PT Duration 12 weeks    PT Treatment/Interventions Electrical Stimulation;Fluidtherapy;Moist Heat;Functional mobility training;Therapeutic activities;Therapeutic exercise;Patient/family education;Manual techniques;Scar mobilization;Passive range of motion;Balance training;Neuromuscular re-education;Cryotherapy;DME Instruction    PT Next Visit Plan Discuss phase 2 of surgical protocol.    PT Home Exercise Plan See HEP.  Access Code: [email or web address]     Consulted and Agree with Plan of Care Patient             Patient will benefit from skilled therapeutic intervention in order to improve the following deficits and impairments:  Decreased endurance, Decreased mobility, Decreased skin integrity, Hypomobility, Impaired sensation, Improper body mechanics, Increased edema, Decreased scar mobility, Decreased range of motion, Decreased activity tolerance, Decreased strength, Impaired UE functional use, Pain  Visit Diagnosis: Acute pain of left shoulder  Decreased strength, endurance, and mobility  Decreased ROM of left shoulder     Problem List Patient Active Problem List   Diagnosis Date Noted   Pancreatic cyst 11/21/2018   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   Leucopenia 07/06/2018   B12 deficiency 07/06/2018   Fatty liver disease, nonalcoholic 01/74/9449   Onychomycosis 07/20/2017   Hypertensive disorder 12/03/2016   Intracranial aneurysm 12/03/2016   Family history of brain aneurysm 08/07/2016   Obstructive sleep apnea 06/17/2016   Hyperglycemia, unspecified 01/29/2016   Morbid obesity with BMI of 45.0-49.9, adult ([place]) 04/08/2015   Myalgia 11/15/2014   Costochondritis 04/11/2014    Osteopenia 04/11/2014   Prediabetes 02/15/2014   Hypercholesterolemia 09/26/2013   Degenerative arthritis of left knee 06/02/2013   Pura Spice, PT, DPT # [id number] Fara Olden SPT 06/27/2020, 4:00 PM  [facility] [address] [place], [place], [address] Phone: [phone]   Fax:  [phone]  Name: Darlene Chen MRN: [id number] Date of Birth: 1950/01/18

## 2020-07-02 ENCOUNTER — Encounter: Payer: Self-pay | Admitting: Physical Therapy

## 2020-07-02 ENCOUNTER — Ambulatory Visit: Payer: Medicare HMO

## 2020-07-02 ENCOUNTER — Other Ambulatory Visit: Payer: Self-pay

## 2020-07-02 DIAGNOSIS — Z7409 Other reduced mobility: Secondary | ICD-10-CM

## 2020-07-02 DIAGNOSIS — M25512 Pain in left shoulder: Secondary | ICD-10-CM | POA: Diagnosis not present

## 2020-07-02 DIAGNOSIS — M25612 Stiffness of left shoulder, not elsewhere classified: Secondary | ICD-10-CM

## 2020-07-02 NOTE — Therapy (Signed)
[place] [facility] [address] [place], [place], [address] Phone: [phone]   Fax:  [phone]  Physical Therapy Treatment  Patient Details  Name: Darlene Chen MRN: [id number] Date of Birth: 1950-06-08 Referring Provider (PT): Johna Sheriff, MD   Encounter Date: 07/02/2020   PT End of Session - 07/02/20 1554     Visit Number 5    Number of Visits 24    Date for PT Re-Evaluation 09/10/20    Authorization - Visit Number 4    Authorization - Number of Visits 10    PT Start Time 3875    PT Stop Time 1522    PT Time Calculation (min) 51 min    Activity Tolerance Patient tolerated treatment well    Behavior During Therapy [facility] for tasks assessed/performed             Past Medical History:  Diagnosis Date   Diabetes mellitus without complication ([place])    High cholesterol    Hypertension    Neutropenia ([place])     Past Surgical History:  Procedure Laterality Date   arthroscopic knee surgery  on left 2000     BREAST BIOPSY Right 2019   bx/clip-neg   LEFT HEART CATH AND CORONARY ANGIOGRAPHY N/A 02/09/2017   Procedure: LEFT HEART CATH AND CORONARY ANGIOGRAPHY;  Surgeon: Yolonda Kida, MD;  Location: [place] [facility];  Service: Cardiovascular;  Laterality: N/A;   REDUCTION MAMMAPLASTY Bilateral    1990   TUBAL LIGATION  6433   UMBILICAL HERNIA REPAIR  2007    There were no vitals filed for this visit.   Subjective Assessment - 07/02/20 1448     Subjective Pt presents to tx with no complaints of pain over the weekend. Pt stated good adherence with ice to manage inflammation at home. Pt stated no pain at the start of tx today.    Pertinent History Pt. states return to surgeon 06/25/20 for check up and possible stable removal. Pt. reports no other surgeries in the last 5 years. However, plans on getting TKAs to both LE once the shoulder rehab has resolved. Pt is currently not active 2/2 bilat knee pain.   Pt. is retired and lives with husband.    Limitations House hold activities;Standing;Walking;Lifting    How long can you sit comfortably? --    How long can you stand comfortably? --    How long can you walk comfortably? --    Patient Stated Goals Pt would like to return to independent house work and return to full functional activity.    Currently in Pain? No/denies    Pain Onset 1 to 4 weeks ago             Therapeutic Exercise:  Pulleys flex/ext 2x20 verbal cueing to ensure quality of movement.   Figure Ladder x5 save length of previous. Verbal cueing to only come down 5 levels to promote increase time at shoulder flex.   AROM x20 flex, abd, and ext in neutral. verbal and contact ensure to ensure safety of movement  that is agreeable to surgical protocol.    AAROM in supine:  Bilat shoulder flexion to 90 degs x20 with wood dowel  Wood dowel bench press x 20   ER to 30 degs with wood dowel x 20   Manual Therapy: 19 mins total   Supine R shoulder PROM with OP at end-range flexion, abd, and ER   Light STM to R biceps during  PROM.     10 min ice at the end of treatment to reduce inflammation. Not billed.     ROM Measurements for R shoulder  PROM ER 40 deg  Flexion 110 deg Abd 110 deg  AROM Flexion 60 Abd 70      PT Short Term Goals - 06/18/20 1334       PT SHORT TERM GOAL #1   Title Pt will begin UE AROM exercises to facilitate independence with light ADLs (Feeding, grooming) per post-surgical protocol.    Baseline Unable to complete AROM    Time 3    Period Weeks    Status New    Target Date 07/09/20      PT SHORT TERM GOAL #2   Title Pt will display proper strength, posture, and confidence with RUE to discontinue sling for increase access to independent ADL function.    Baseline Doned sling during all times of day/night besides HEP and grooming.    Time 3    Period Weeks    Status New    Target Date 07/09/20               PT Long Term  Goals - 06/18/20 1327       PT LONG TERM GOAL #1   Title Pt will score a 58 on the FOTO to promote objective functional return to PLOF    Baseline 6/14: 28    Time 12    Period Weeks    Status New    Target Date 09/10/20      PT LONG TERM GOAL #2   Title Pt. will achieve R shoulder Flex+ABD AROM to 120 deg to promote independence with ADLs.    Baseline 06/18/20 abd: 72 deg./  flex: 65 deg.    Time 12    Period Weeks    Status New    Target Date 09/10/20      PT LONG TERM GOAL #3   Title Pt will improve R shoulder ER AAROM to 30 deg to promote independence with ADLs.    Baseline 11 deg. on 6/14    Time 12    Period Weeks    Status New    Target Date 09/10/20      PT LONG TERM GOAL #4   Title Pt will achieve R shoulder MMT 4/5 to facilitate workload tolerance similar to ADLs    Baseline N/T    Time 12    Period Weeks    Status New    Target Date 09/10/20      PT LONG TERM GOAL #5   Title Pt will report <2/10 pain with reaching/lifting tasks to promote functional ability so that she can participate in household tasks.    Baseline 2/10 R shoulder pain currently at rest.    Time 12    Period Weeks    Status New    Target Date 09/10/20                   Plan - 07/02/20 1448     Clinical Impression Statement Pt tx was progressed to include AAROM and AROM exercise to target R deltoid firing that was agreeable with the surgical protocol. Pt was able to complete all tx without increase in pain and only stiffness at end-range. Current PROM: ER 40 deg,   Flexion 110 deg, Abd 110 deg. Current AROM: Flexion 60, Abd 70. All PT intervention was agreeable with the surgical protocol for safety of movement and  efficiency of pt progression. Pt will continue to benefit from skilled physical therapy to progress POC to address remaining deficits to facilitate maximum functional capacity for optimal personal health and wellness for ADLs.    Examination-Activity Limitations  Hygiene/Grooming;Bathing;Self Feeding    Examination-Participation Restrictions Laundry;Cleaning;Driving;Meal Prep    Stability/Clinical Decision Making Evolving/Moderate complexity    Clinical Decision Making Moderate    Rehab Potential Good    Clinical Impairments Affecting Rehab Potential Negative: chronic nature of pain, comorbidities. Positive: motivated, family support    PT Frequency 2x / week    PT Duration 12 weeks    PT Treatment/Interventions Electrical Stimulation;Fluidtherapy;Moist Heat;Functional mobility training;Therapeutic activities;Therapeutic exercise;Patient/family education;Manual techniques;Scar mobilization;Passive range of motion;Balance training;Neuromuscular re-education;Cryotherapy;DME Instruction    PT Next Visit Plan reassess DOMS post AROM/AROM intervention for proper loading.    PT Home Exercise Plan See HEP.  Access Code: [email or web address]     Consulted and Agree with Plan of Care Patient             Patient will benefit from skilled therapeutic intervention in order to improve the following deficits and impairments:  Decreased endurance, Decreased mobility, Decreased skin integrity, Hypomobility, Impaired sensation, Improper body mechanics, Increased edema, Decreased scar mobility, Decreased range of motion, Decreased activity tolerance, Decreased strength, Impaired UE functional use, Pain  Visit Diagnosis: Acute pain of left shoulder  Decreased strength, endurance, and mobility  Decreased ROM of left shoulder     Problem List Patient Active Problem List   Diagnosis Date Noted   Pancreatic cyst 11/21/2018   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[place]) 09/21/2018   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 Fairly IV, PT, DPT Physical Therapist- [facility]   07/02/2020, 4:00 PM  [place] [facility] [address] [place], [place], [address] Phone: [phone]   Fax:  [phone]  Name: Darlene Chen MRN: [id number] Date of Birth: 11-Apr-1950

## 2020-07-04 ENCOUNTER — Encounter: Payer: Self-pay | Admitting: Physical Therapy

## 2020-07-04 ENCOUNTER — Ambulatory Visit: Payer: Medicare HMO

## 2020-07-04 ENCOUNTER — Other Ambulatory Visit: Payer: Self-pay

## 2020-07-04 DIAGNOSIS — M25512 Pain in left shoulder: Secondary | ICD-10-CM

## 2020-07-04 DIAGNOSIS — R6889 Other general symptoms and signs: Secondary | ICD-10-CM

## 2020-07-04 NOTE — Therapy (Signed)
[place] [facility] [address] [place], [place], [address] Phone: [phone]   Fax:  [phone]  Physical Therapy Treatment  Patient Details  Name: Darlene Chen MRN: [id number] Date of Birth: Jul 18, 1950 Referring Provider (PT): Johna Sheriff, MD   Encounter Date: 07/04/2020   PT End of Session - 07/04/20 1543     Visit Number 6    Number of Visits 24    Date for PT Re-Evaluation 09/10/20    Authorization - Visit Number 6    Authorization - Number of Visits 10    PT Start Time 2637    PT Stop Time 1526    PT Time Calculation (min) 57 min    Activity Tolerance Patient tolerated treatment well    Behavior During Therapy [facility] for tasks assessed/performed             Past Medical History:  Diagnosis Date   Diabetes mellitus without complication ([place])    High cholesterol    Hypertension    Neutropenia ([place])     Past Surgical History:  Procedure Laterality Date   arthroscopic knee surgery  on left 2000     BREAST BIOPSY Right 2019   bx/clip-neg   LEFT HEART CATH AND CORONARY ANGIOGRAPHY N/A 02/09/2017   Procedure: LEFT HEART CATH AND CORONARY ANGIOGRAPHY;  Surgeon: Yolonda Kida, MD;  Location: [place] [facility];  Service: Cardiovascular;  Laterality: N/A;   REDUCTION MAMMAPLASTY Bilateral    1990   TUBAL LIGATION  8588   UMBILICAL HERNIA REPAIR  2007    There were no vitals filed for this visit.   Subjective Assessment - 07/04/20 1539     Subjective Pt presented to tx with reports a dull ache in the R shoulder but no pain. Pt stated that she noticed light yellow discharge from her incision over the last 2 mornings that was dried to her night gown. Pt denies fever, warm, or smell. Pt has called her surgeon's office today at 11:30 am and expects a return call this afternoon. Pt states no delayed onset muscle soreness with newly incorporated AAROM/AROM exercise last tx. Pt has begun to tapper her  Tylenol dosage to once a day and has not needed any additional pain medication during the rehab.    Pertinent History Pt. states return to surgeon 06/25/20 for check up and possible stable removal. Pt. reports no other surgeries in the last 5 years. However, plans on getting TKAs to both LE once the shoulder rehab has resolved. Pt is currently not active 2/2 bilat knee pain.  Pt. is retired and lives with husband.    Currently in Pain? Other (Comment)   Pt reported dull ache, but no pain.   Pain Score 0-No pain    Pain Location Shoulder    Pain Orientation Right    Pain Descriptors / Indicators Aching    Pain Onset Today              Therapeutic Exercise:   Pulleys flex/ext 2 min each direction. Verbal cueing for slow-and-controlled movement to promote safe ROM improvement and warm up before AAROM/AROM treatment.    Figure Ladder x5 same length of previous. Verbal cueing to only come down 5 levels to promote increase time at shoulder flex. PT demo.   AROM x20 flex, abd, and isometric ext (5 secs in neutral against a ball). verbal and contact ensure to ensure safety of movement  that is agreeable to surgical protocol.  AAROM Side-lying ER with therapist assist to facilitate proper muscle firing and smooth and controlled motion.   AAROM in supine: Bilat shoulder flexion to 90 degs x20 with wood dowel   Wood dowel bench press x 20   ER to 30 degs with wood dowel x 20      Manual Therapy: 22 mins total   Supine R shoulder PROM with OP at end-range flexion, abd, and ER   STM to R biceps during PROM due to noted tightness from time in sling with elbow flexed. End range shoulder motions maintained at and below of what the protocol requires.     10 min ice at the end of treatment to reduce inflammation. Pt reports 0/10 pain NPS post ice. Not billed.   PT Education - 07/04/20 1540     Education Details Pt educated on proper incision observation, and proper form techquie to  maintain surgical protocol    Person(s) Educated Patient    Methods Explanation    Comprehension Verbalized understanding              PT Short Term Goals - 06/18/20 1334       PT SHORT TERM GOAL #1   Title Pt will begin UE AROM exercises to facilitate independence with light ADLs (Feeding, grooming) per post-surgical protocol.    Baseline Unable to complete AROM    Time 3    Period Weeks    Status New    Target Date 07/09/20      PT SHORT TERM GOAL #2   Title Pt will display proper strength, posture, and confidence with RUE to discontinue sling for increase access to independent ADL function.    Baseline Doned sling during all times of day/night besides HEP and grooming.    Time 3    Period Weeks    Status New    Target Date 07/09/20               PT Long Term Goals - 06/18/20 1327       PT LONG TERM GOAL #1   Title Pt will score a 58 on the FOTO to promote objective functional return to PLOF    Baseline 6/14: 28    Time 12    Period Weeks    Status New    Target Date 09/10/20      PT LONG TERM GOAL #2   Title Pt. will achieve R shoulder Flex+ABD AROM to 120 deg to promote independence with ADLs.    Baseline 06/18/20 abd: 72 deg./  flex: 65 deg.    Time 12    Period Weeks    Status New    Target Date 09/10/20      PT LONG TERM GOAL #3   Title Pt will improve R shoulder ER AAROM to 30 deg to promote independence with ADLs.    Baseline 11 deg. on 6/14    Time 12    Period Weeks    Status New    Target Date 09/10/20      PT LONG TERM GOAL #4   Title Pt will achieve R shoulder MMT 4/5 to facilitate workload tolerance similar to ADLs    Baseline N/T    Time 12    Period Weeks    Status New    Target Date 09/10/20      PT LONG TERM GOAL #5   Title Pt will report <2/10 pain with reaching/lifting tasks to promote functional ability so that she  can participate in household tasks.    Baseline 2/10 R shoulder pain currently at rest.    Time 12     Period Weeks    Status New    Target Date 09/10/20                   Plan - 07/04/20 1546     Clinical Impression Statement Pt reported 3-4/10 pain at end of treatment during movement. 0/10 pain after icing. Pt reported location was R biceps and distal pectoralis muscles. Pt stated the pain was soreness-like during eccentric motion, particular in AROM which indicates muscles fatigue. Pt's incision displayed with mild dry discharge, no increased swelling, or redness/warm greater than baseline. Pt was educated on signs of infection and to continue to pursue surgeon's office for possible check up. Pt. will continue to benefit from skilled physical therapy to progress POC to address remaining deficits to facilitate maximum functional capacity for optimal personal health and wellness for ADLs.    Examination-Activity Limitations Hygiene/Grooming;Bathing;Self Feeding    Examination-Participation Restrictions Laundry;Cleaning;Driving;Meal Prep    Stability/Clinical Decision Making Evolving/Moderate complexity    Clinical Decision Making Moderate    Rehab Potential Good    Clinical Impairments Affecting Rehab Potential Negative: chronic nature of pain, comorbidities. Positive: motivated, family support    PT Frequency 2x / week    PT Duration 12 weeks    PT Treatment/Interventions Electrical Stimulation;Fluidtherapy;Moist Heat;Functional mobility training;Therapeutic activities;Therapeutic exercise;Patient/family education;Manual techniques;Scar mobilization;Passive range of motion;Balance training;Neuromuscular re-education;Cryotherapy;DME Instruction    PT Next Visit Plan UPDATED HEP TARGET AAROM/AROM AND ELBOW FLEXION. reassess incision.    PT Home Exercise Plan See HEP.  Access Code: [email or web address]     Consulted and Agree with Plan of Care Patient             Patient will benefit from skilled therapeutic intervention in order to improve the following deficits and impairments:  Decreased  endurance, Decreased mobility, Decreased skin integrity, Hypomobility, Impaired sensation, Improper body mechanics, Increased edema, Decreased scar mobility, Decreased range of motion, Decreased activity tolerance, Decreased strength, Impaired UE functional use, Pain  Visit Diagnosis: Acute pain of left shoulder  Decreased strength, endurance, and mobility     Problem List Patient Active Problem List   Diagnosis Date Noted   Pancreatic cyst 11/21/2018   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[place]) 09/21/2018   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   Leucopenia 07/06/2018   B12 deficiency 07/06/2018   Fatty liver disease, nonalcoholic 35/45/6256   Onychomycosis 07/20/2017   Hypertensive disorder 12/03/2016   Intracranial aneurysm 12/03/2016   Family history of brain aneurysm 08/07/2016   Obstructive sleep apnea 06/17/2016   Hyperglycemia, unspecified 01/29/2016   Morbid obesity with BMI of 45.0-49.9, adult ([place]) 04/08/2015   Myalgia 11/15/2014   Costochondritis 04/11/2014   Osteopenia 04/11/2014   Prediabetes 02/15/2014   Hypercholesterolemia 09/26/2013   Degenerative arthritis of left knee 06/02/2013    Richardson Chiquito Sheilia Reznick SPT  [place]. Fairly IV, PT, DPT Physical Therapist- [facility]  07/04/2020, 4:35 PM  [place] [facility] [address] [place], [place], [address] Phone: [phone]   Fax:  [phone]  Name: Sumiya Mamaril MRN: [id number] Date of Birth: Mar 10, 1950

## 2020-07-09 ENCOUNTER — Ambulatory Visit: Payer: Medicare HMO | Attending: Orthopedic Surgery

## 2020-07-09 ENCOUNTER — Other Ambulatory Visit: Payer: Self-pay

## 2020-07-09 DIAGNOSIS — R6889 Other general symptoms and signs: Secondary | ICD-10-CM | POA: Insufficient documentation

## 2020-07-09 DIAGNOSIS — R531 Weakness: Secondary | ICD-10-CM | POA: Diagnosis present

## 2020-07-09 DIAGNOSIS — M25612 Stiffness of left shoulder, not elsewhere classified: Secondary | ICD-10-CM | POA: Insufficient documentation

## 2020-07-09 DIAGNOSIS — M25512 Pain in left shoulder: Secondary | ICD-10-CM | POA: Diagnosis present

## 2020-07-09 DIAGNOSIS — Z7409 Other reduced mobility: Secondary | ICD-10-CM | POA: Insufficient documentation

## 2020-07-09 NOTE — Therapy (Signed)
[facility] [address] [place], [place], [address] Phone: [phone]   Fax:  [phone]  Physical Therapy Treatment  Patient Details  Name: Darlene Chen MRN: [id number] Date of Birth: 08/31/1950 Referring Provider (PT): Johna Sheriff, MD   Encounter Date: 07/09/2020   PT End of Session - 07/09/20 1518     Visit Number 7    Number of Visits 24    Date for PT Re-Evaluation 09/10/20    Authorization - Visit Number 7    Authorization - Number of Visits 10    PT Start Time 0174    PT Stop Time 1514    PT Time Calculation (min) 45 min    Activity Tolerance Patient tolerated treatment well    Behavior During Therapy [facility] for tasks assessed/performed             Past Medical History:  Diagnosis Date   Diabetes mellitus without complication ([place])    High cholesterol    Hypertension    Neutropenia ([place])     Past Surgical History:  Procedure Laterality Date   arthroscopic knee surgery  on left 2000     BREAST BIOPSY Right 2019   bx/clip-neg   LEFT HEART CATH AND CORONARY ANGIOGRAPHY N/A 02/09/2017   Procedure: LEFT HEART CATH AND CORONARY ANGIOGRAPHY;  Surgeon: Yolonda Kida, MD;  Location: [facility];  Service: Cardiovascular;  Laterality: N/A;   REDUCTION MAMMAPLASTY Bilateral    1990   TUBAL LIGATION  9449   UMBILICAL HERNIA REPAIR  2007    There were no vitals filed for this visit.   Subjective Assessment - 07/09/20 1534     Subjective Pt presented to tx with 3/10 stiffness in the R shoulder, primarily located at the anterior aspect of the shoulder. Pt stated that she called her surgeon's office last Friday (7/1) about incision discharge that was discussed last tx. Pt had a follow-up call this morning (7/5) and report no additional discharge from last Friday. Pt stated that she was cleared to take a shower.    Pertinent History Pt. states return to surgeon 06/25/20 for check up and  possible stable removal. Pt. reports no other surgeries in the last 5 years. However, plans on getting TKAs to both LE once the shoulder rehab has resolved. Pt is currently not active 2/2 bilat knee pain.  Pt. is retired and lives with husband.    Currently in Pain? Other (Comment)   "stiffness no pain"-pt reported   Pain Score 3     Pain Location Shoulder    Pain Orientation Right    Pain Onset Today                                       PT Education - 07/09/20 1541     Education Details Pt was educated on possible increase in muscle soreness due to increase activation of R shoulder muscles. Pt was educated to keep pain < 3/10.    Person(s) Educated Patient    Methods Explanation    Comprehension Verbalized understanding              PT Short Term Goals - 06/18/20 1334       PT SHORT TERM GOAL #1   Title Pt will begin UE AROM exercises to facilitate independence with light ADLs (Feeding, grooming) per post-surgical protocol.  Baseline Unable to complete AROM    Time 3    Period Weeks    Status New    Target Date 07/09/20      PT SHORT TERM GOAL #2   Title Pt will display proper strength, posture, and confidence with RUE to discontinue sling for increase access to independent ADL function.    Baseline Doned sling during all times of day/night besides HEP and grooming.    Time 3    Period Weeks    Status New    Target Date 07/09/20               PT Long Term Goals - 06/18/20 1327       PT LONG TERM GOAL #1   Title Pt will score a 58 on the FOTO to promote objective functional return to PLOF    Baseline 6/14: 28    Time 12    Period Weeks    Status New    Target Date 09/10/20      PT LONG TERM GOAL #2   Title Pt. will achieve R shoulder Flex+ABD AROM to 120 deg to promote independence with ADLs.    Baseline 06/18/20 abd: 72 deg./  flex: 65 deg.    Time 12    Period Weeks    Status New    Target Date 09/10/20      PT LONG  TERM GOAL #3   Title Pt will improve R shoulder ER AAROM to 30 deg to promote independence with ADLs.    Baseline 11 deg. on 6/14    Time 12    Period Weeks    Status New    Target Date 09/10/20      PT LONG TERM GOAL #4   Title Pt will achieve R shoulder MMT 4/5 to facilitate workload tolerance similar to ADLs    Baseline N/T    Time 12    Period Weeks    Status New    Target Date 09/10/20      PT LONG TERM GOAL #5   Title Pt will report <2/10 pain with reaching/lifting tasks to promote functional ability so that she can participate in household tasks.    Baseline 2/10 R shoulder pain currently at rest.    Time 12    Period Weeks    Status New    Target Date 09/10/20              Therapeutic Exercise:   Pulleys flex/ext 2 min each direction. Verbal cueing for slow-and-controlled movement to promote safe ROM improvement and warm up before AAROM/AROM treatment.   Figure Ladder x5 same length of previous. Verbal cueing to only come down 5 levels to promote increase time at shoulder flex. PT demo.   AROM x20 flex, abd, and isometric ext (5 secs in neutral against a ball). verbal and contact to ensure safety of movement  that is agreeable to surgical protocol. Bilat movement was included this time to promote UE symmetry with increase R shoulder muscle activation.    AAROM Side-lying ER x20 with therapist assist to facilitate proper muscle firing and smooth and controlled motion. Multimodal cuing required for pt set up.   AAROM in supine: Bilat shoulder flexion to 120 degs x20 with wood dowel   Wood dowel bench press x 20  Shoulder shrugs x20 with verbal and contact cueing to reduction upper trap tightness during eccentric phase to promote reduce stiffness in R shoulder.  Manual Therapy: 17 mins total   Supine R shoulder PROM with OP at end-range flexion, abd, and ER   STM to R biceps during PROM due to noted tightness from time in sling with elbow flexed. End  range shoulder motions maintained at and below of what the protocol requires.     10 min ice at the end of treatment to reduce inflammation. Pt reports 0/10 pain NPS post ice. Not billed.   Plan - 07/09/20 1543     Clinical Impression Statement Pt displayed increase muscle activation with standing AROM in flexion and abd. Pt surgical incision displayed improved adhesion compared last tx with reduction in dried discharge. Pt requires verbal/contact cueing during AAROM/AROM to ensure quality of safe movement. All treatment was done within surgical protocol to ensure maximal safety and efficiency of rehabilitation. Pt reported reduction in R shoulder stiffness to 0/10 at the end of tx. Pt. will continue to benefit from skilled physical therapy to progress POC to address remaining deficits to facilitate maximum functional capacity for optimal personal health and wellness for ADLs.    Examination-Activity Limitations Hygiene/Grooming;Bathing;Self Feeding    Examination-Participation Restrictions Laundry;Cleaning;Driving;Meal Prep    Stability/Clinical Decision Making Evolving/Moderate complexity    Clinical Decision Making Moderate    Rehab Potential Good    Clinical Impairments Affecting Rehab Potential Negative: chronic nature of pain, comorbidities. Positive: motivated, family support    PT Frequency 2x / week    PT Duration 12 weeks    PT Treatment/Interventions Electrical Stimulation;Fluidtherapy;Moist Heat;Functional mobility training;Therapeutic activities;Therapeutic exercise;Patient/family education;Manual techniques;Scar mobilization;Passive range of motion;Balance training;Neuromuscular re-education;Cryotherapy;DME Instruction    PT Next Visit Plan UPDATED HEP TARGET AAROM/AROM AND ELBOW FLEXION. reassess incision.    PT Home Exercise Plan See HEP.  Access Code: [email or web address]     Consulted and Agree with Plan of Care Patient             Patient will benefit from skilled therapeutic  intervention in order to improve the following deficits and impairments:  Decreased endurance, Decreased mobility, Decreased skin integrity, Hypomobility, Impaired sensation, Improper body mechanics, Increased edema, Decreased scar mobility, Decreased range of motion, Decreased activity tolerance, Decreased strength, Impaired UE functional use, Pain  Visit Diagnosis: Acute pain of left shoulder  Decreased strength, endurance, and mobility  Decreased ROM of left shoulder     Problem List Patient Active Problem List   Diagnosis Date Noted   Pancreatic cyst 11/21/2018   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[place]) 09/21/2018   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   Leucopenia 07/06/2018   B12 deficiency 07/06/2018   Fatty liver disease, nonalcoholic 60/63/0160   Onychomycosis 07/20/2017   Hypertensive disorder 12/03/2016   Intracranial aneurysm 12/03/2016   Family history of brain aneurysm 08/07/2016   Obstructive sleep apnea 06/17/2016   Hyperglycemia, unspecified 01/29/2016   Morbid obesity with BMI of 45.0-49.9, adult ([place]) 04/08/2015   Myalgia 11/15/2014   Costochondritis 04/11/2014   Osteopenia 04/11/2014   Prediabetes 02/15/2014   Hypercholesterolemia 09/26/2013   Degenerative arthritis of left knee 06/02/2013    Richardson Chiquito Deira Shimer SPT  [place]. Fairly IV, PT, DPT Physical Therapist- [facility]  07/09/2020, 5:09 PM  [facility] [address] [place], [place], [address] Phone: [phone]   Fax:  [phone]  Name: Darlene Chen MRN: [id number] Date of Birth: 1950-05-21

## 2020-07-11 ENCOUNTER — Ambulatory Visit: Payer: Medicare HMO

## 2020-07-11 ENCOUNTER — Encounter: Payer: Self-pay | Admitting: Physical Therapy

## 2020-07-11 ENCOUNTER — Other Ambulatory Visit: Payer: Self-pay

## 2020-07-11 DIAGNOSIS — Z7409 Other reduced mobility: Secondary | ICD-10-CM

## 2020-07-11 DIAGNOSIS — R6889 Other general symptoms and signs: Secondary | ICD-10-CM

## 2020-07-11 DIAGNOSIS — M25512 Pain in left shoulder: Secondary | ICD-10-CM

## 2020-07-11 DIAGNOSIS — M25612 Stiffness of left shoulder, not elsewhere classified: Secondary | ICD-10-CM

## 2020-07-11 NOTE — Therapy (Addendum)
[facility] [address] [place], [place], [address] Phone: [phone]   Fax:  [phone]  Physical Therapy Treatment  Patient Details  Name: Wynonia Medero MRN: [id number] Date of Birth: [birth year] Referring Provider (PT): Johna Sheriff, MD   Encounter Date: 07/11/2020   PT End of Session - 07/11/20 1442     Visit Number 8    Number of Visits 24    Date for PT Re-Evaluation 09/10/20    Authorization - Visit Number 8    Authorization - Number of Visits 10    PT Start Time 1062    PT Stop Time 1514    PT Time Calculation (min) 43 min    Activity Tolerance Patient tolerated treatment well    Behavior During Therapy [facility] for tasks assessed/performed             Past Medical History:  Diagnosis Date   Diabetes mellitus without complication ([place])    High cholesterol    Hypertension    Neutropenia ([place])     Past Surgical History:  Procedure Laterality Date   arthroscopic knee surgery  on left 2000     BREAST BIOPSY Right 2019   bx/clip-neg   LEFT HEART CATH AND CORONARY ANGIOGRAPHY N/A 02/09/2017   Procedure: LEFT HEART CATH AND CORONARY ANGIOGRAPHY;  Surgeon: Yolonda Kida, MD;  Location: [facility];  Service: Cardiovascular;  Laterality: N/A;   REDUCTION MAMMAPLASTY Bilateral    1990   TUBAL LIGATION  6948   UMBILICAL HERNIA REPAIR  2007    There were no vitals filed for this visit.   Subjective Assessment - 07/11/20 1439     Subjective Pt presents to tx without sling because she forgot it. Pt states no pain at the start of tx. Pt states no additional discharge or issues with incision. Pt states that she is icing x2 hrs in evening before bed.    Pertinent History Pt. states return to surgeon 06/25/20 for check up and possible stable removal. Pt. reports no other surgeries in the last 5 years. However, plans on getting TKAs to both LE once the shoulder rehab has resolved. Pt is  currently not active 2/2 bilat knee pain.  Pt. is retired and lives with husband.    How long can you sit comfortably? WFL    How long can you stand comfortably? WFL with shoulder. limited by knee    How long can you walk comfortably? WFL    Patient Stated Goals Pt would like to return to independent house work and return to full functional activity.    Currently in Pain? No/denies    Pain Score 0-No pain    Pain Onset Today    Aggravating Factors  Increased activity    Pain Relieving Factors medication/Ice    Multiple Pain Sites No               Therapeutic Exercise:   Pulleys flex/ext 2 min each direction. Verbal cueing for slow-and-controlled movement to promote safe ROM improvement and warm up before AAROM/AROM treatment.     AROM x20 flex, abd, bilat shoulder ER to 30 degs with scapular retraction in long sitting. and isometric ext (5 secs in neutral against a ball). verbal and contact to ensure safety of movement  that is agreeable to surgical protocol. Bilat movement was included this time to promote UE symmetry with increase R shoulder muscle activation.       AAROM in  supine: Bilat shoulder flexion to 120 degs x20 with wood dowel in complete supine. Addition x10 in partial long sitting at 45 deg head of bed elevation.    Wood dowel bench press x 20 complete supine. Addition x10 in partial long sitting at 45 deg head of bed elevation   reassessment PROM and AROM:  Pt displayed improved R should AROM to 80 in flexion, howevever, was only able to complete 50 abd 2/2 deloid fatigue at end of tx. Pt was able to improve bilat R shoulder PROM to 115 deg.      10 min ice at the end of treatment to reduce inflammation. Pt reports 0/10 pain NPS post ice. Not billed.          PT Education - 07/11/20 1732     Education provided --    Education Details --    Person(s) Educated --    Methods --    Comprehension --              PT Short Term Goals - 07/11/20  1520       PT SHORT TERM GOAL #1   Title Pt will begin UE AROM exercises to facilitate independence with light ADLs (Feeding, grooming) per post-surgical protocol.    Baseline Unable to complete AROM. 7/7: Pt is able to feed and wash face with her RUE    Time 3    Period Weeks    Status Achieved    Target Date 07/09/20      PT SHORT TERM GOAL #2   Title Pt will display proper strength, posture, and confidence with RUE to discontinue sling for increase access to independent ADL function.    Baseline Doned sling during all times of day/night besides HEP and grooming. 7/7 Pt currently does not wear the sling in the home during the day per-protocol.    Time 3    Period Weeks    Status Achieved    Target Date 07/09/20               PT Long Term Goals - 06/18/20 1327       PT LONG TERM GOAL #1   Title Pt will score a 58 on the FOTO to promote objective functional return to PLOF    Baseline 6/14: 28    Time 12    Period Weeks    Status New    Target Date 09/10/20      PT LONG TERM GOAL #2   Title Pt. will achieve R shoulder Flex+ABD AROM to 120 deg to promote independence with ADLs.    Baseline 06/18/20 abd: 72 deg./  flex: 65 deg.    Time 12    Period Weeks    Status New    Target Date 09/10/20      PT LONG TERM GOAL #3   Title Pt will improve R shoulder ER AAROM to 30 deg to promote independence with ADLs.    Baseline 11 deg. on 6/14    Time 12    Period Weeks    Status New    Target Date 09/10/20      PT LONG TERM GOAL #4   Title Pt will achieve R shoulder MMT 4/5 to facilitate workload tolerance similar to ADLs    Baseline N/T    Time 12    Period Weeks    Status New    Target Date 09/10/20      PT LONG TERM GOAL #5  Title Pt will report <2/10 pain with reaching/lifting tasks to promote functional ability so that she can participate in household tasks.    Baseline 2/10 R shoulder pain currently at rest.    Time 12    Period Weeks    Status New    Target  Date 09/10/20                   Plan - 07/11/20 1503     Clinical Impression Statement Pt displayed good understanding of updated HEP with good understanding of correct technique with verbal confirmation and good demonstration. Pt was able to improved AAROM loading on the R shoulder be completing shoulder ext and press up with dowel from complete supine to 45 partial long sitting. Pt incision continued to display good healing and self-adherence without sign of delayed closing or infection. Pt displayed improved R should AROM to 80 in flexion, howevever, was only able to complete 50 abd 2/2 deloid fatigue at end of tx. Pt was ablt to improve bilat R shoulder PROM to 115 deg.   Pt. will continue to benefit from skilled physical therapy to progress POC to address remaining deficits to facilitate maximum functional capacity for optimal personal health and wellness for ADLs.    Examination-Activity Limitations Hygiene/Grooming;Bathing;Self Feeding    Examination-Participation Restrictions Laundry;Cleaning;Driving;Meal Prep    Stability/Clinical Decision Making Evolving/Moderate complexity    Clinical Decision Making Moderate    Rehab Potential Good    Clinical Impairments Affecting Rehab Potential Negative: chronic nature of pain, comorbidities. Positive: motivated, family support    PT Frequency 2x / week    PT Duration 12 weeks    PT Treatment/Interventions Electrical Stimulation;Fluidtherapy;Moist Heat;Functional mobility training;Therapeutic activities;Therapeutic exercise;Patient/family education;Manual techniques;Scar mobilization;Passive range of motion;Balance training;Neuromuscular re-education;Cryotherapy;DME Instruction    PT Next Visit Plan Reassess incision. Progress AAROM and AROM. reassess new HEP adherence.    PT Home Exercise Plan See HEP.  Access Code: [email or web address]     Consulted and Agree with Plan of Care Patient             Patient will benefit from skilled therapeutic  intervention in order to improve the following deficits and impairments:  Decreased endurance, Decreased mobility, Decreased skin integrity, Hypomobility, Impaired sensation, Improper body mechanics, Increased edema, Decreased scar mobility, Decreased range of motion, Decreased activity tolerance, Decreased strength, Impaired UE functional use, Pain  Visit Diagnosis: Acute pain of left shoulder  Decreased ROM of left shoulder  Decreased strength, endurance, and mobility     Problem List Patient Active Problem List   Diagnosis Date Noted   Pancreatic cyst 11/21/2018   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[place]) 09/21/2018   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   Leucopenia 07/06/2018   B12 deficiency 07/06/2018   Fatty liver disease, nonalcoholic 62/69/4854   Onychomycosis 07/20/2017   Hypertensive disorder 12/03/2016   Intracranial aneurysm 12/03/2016   Family history of brain aneurysm 08/07/2016   Obstructive sleep apnea 06/17/2016   Hyperglycemia, unspecified 01/29/2016   Morbid obesity with BMI of 45.0-49.9, adult ([place]) 04/08/2015   Myalgia 11/15/2014   Costochondritis 04/11/2014   Osteopenia 04/11/2014   Prediabetes 02/15/2014   Hypercholesterolemia 09/26/2013   Degenerative arthritis of left knee 06/02/2013    Richardson Chiquito Deshay Kirstein SPT  [place]. Fairly IV, PT, DPT Physical Therapist- [facility]  07/11/2020, 7:35 PM  [facility] [address] [place], [place], [address] Phone: [phone]   Fax:  [phone]  Name: Tobin Cadiente MRN:  [id number] Date of Birth: [birth year]

## 2020-07-16 ENCOUNTER — Ambulatory Visit: Payer: Medicare HMO | Admitting: Physical Therapy

## 2020-07-16 ENCOUNTER — Other Ambulatory Visit: Payer: Self-pay

## 2020-07-16 ENCOUNTER — Encounter: Payer: Self-pay | Admitting: Physical Therapy

## 2020-07-16 DIAGNOSIS — R6889 Other general symptoms and signs: Secondary | ICD-10-CM

## 2020-07-16 DIAGNOSIS — M25512 Pain in left shoulder: Secondary | ICD-10-CM | POA: Diagnosis not present

## 2020-07-16 DIAGNOSIS — Z7409 Other reduced mobility: Secondary | ICD-10-CM

## 2020-07-16 DIAGNOSIS — M25612 Stiffness of left shoulder, not elsewhere classified: Secondary | ICD-10-CM

## 2020-07-16 NOTE — Therapy (Signed)
[facility] [address] [place], [place], [address] Phone: [phone]   Fax:  [phone]  Physical Therapy Treatment  Patient Details  Name: Darlene Chen MRN: [id number] Date of Birth: 12-21-1950 Referring Provider (PT): Johna Sheriff, MD   Encounter Date: 07/16/2020   PT End of Session - 07/16/20 1434     Visit Number 9    Number of Visits 24    Date for PT Re-Evaluation 09/10/20    Authorization - Visit Number 9    Authorization - Number of Visits 10    PT Start Time 9735    PT Stop Time 1531    PT Time Calculation (min) 62 min    Activity Tolerance Patient tolerated treatment well    Behavior During Therapy [facility] for tasks assessed/performed             Past Medical History:  Diagnosis Date   Diabetes mellitus without complication ([place])    High cholesterol    Hypertension    Neutropenia ([place])     Past Surgical History:  Procedure Laterality Date   arthroscopic knee surgery  on left 2000     BREAST BIOPSY Right 2019   bx/clip-neg   LEFT HEART CATH AND CORONARY ANGIOGRAPHY N/A 02/09/2017   Procedure: LEFT HEART CATH AND CORONARY ANGIOGRAPHY;  Surgeon: Yolonda Kida, MD;  Location: [place] [facility];  Service: Cardiovascular;  Laterality: N/A;   REDUCTION MAMMAPLASTY Bilateral    1990   TUBAL LIGATION  3299   UMBILICAL HERNIA REPAIR  2007    There were no vitals filed for this visit.   Subjective Assessment - 07/16/20 1430     Subjective Pt presents to tx without pain, however moderate stiffness in the L shoulder. Pt stated mild dry discharge on her night gown in the morning twice in the last 5 days. Pt states good adherence with the new HEP with mild increase in stiffness that returns to baseline after ice.    Pertinent History Pt. states return to surgeon 06/25/20 for check up and possible stable removal. Pt. reports no other surgeries in the last 5 years. However, plans on getting  TKAs to both LE once the shoulder rehab has resolved. Pt is currently not active 2/2 bilat knee pain.  Pt. is retired and lives with husband.    How long can you sit comfortably? WFL    How long can you stand comfortably? WFL with shoulder. limited by knee    How long can you walk comfortably? WFL    Patient Stated Goals Pt would like to return to independent house work and return to full functional activity.    Currently in Pain? No/denies    Pain Score 0-No pain    Pain Location Shoulder    Pain Orientation Left    Pain Descriptors / Indicators --   "stiffness no pain"   Pain Onset Today               Therapeutic Exercise:   Pulleys flex/ext 4 min each direction. Verbal cueing for slow-and-controlled movement to promote safe ROM improvement and warm up before AAROM/AROM treatment.     AROM x20 flex, abd. verbal and contact cueing to ensure safety of movement  that is agreeable to surgical protocol and proper scapular stabilization. Bilat movement was included this time to promote UE symmetry with increase R shoulder muscle activation.   Finger lateral x8 with addition of 1 level. SBA during descending  activity phase to prevent dropping of the arm with onset of fatigue.   ABCs at wall with yellow ball. SBA to prevent arm from falling due to high fatigue levels. Verbal cueing for quality of movement to ensure proper muscle activation to stabilize R shoulder muscle.       AAROM in supine: Bilat shoulder flexion to 120 degs x30 with wood dowel in completed partial elevation HOB elevation with red wedge.   Wood dowel bench press x30 completed partial elevation HOB elevation with red wedge     Manual Therapy: x16 mins  STM to proximal biceps, distal pectoralis, and anterior deltoid during shoulder abd PROM.       10 min ice at the end of treatment to reduce inflammation. Pt reports 0/10 pain NPS post ice. Not billed.          PT Education - 07/16/20 1433     Education  Details Pt educated to start scar massage with soap and water.    Person(s) Educated Patient    Methods Explanation;Demonstration    Comprehension Verbalized understanding              PT Short Term Goals - 07/11/20 1520       PT SHORT TERM GOAL #1   Title Pt will begin UE AROM exercises to facilitate independence with light ADLs (Feeding, grooming) per post-surgical protocol.    Baseline Unable to complete AROM. 7/7: Pt is able to feed and wash face with her RUE    Time 3    Period Weeks    Status Achieved    Target Date 07/09/20      PT SHORT TERM GOAL #2   Title Pt will display proper strength, posture, and confidence with RUE to discontinue sling for increase access to independent ADL function.    Baseline Doned sling during all times of day/night besides HEP and grooming. 7/7 Pt currently does not wear the sling in the home during the day per-protocol.    Time 3    Period Weeks    Status Achieved    Target Date 07/09/20               PT Long Term Goals - 06/18/20 1327       PT LONG TERM GOAL #1   Title Pt will score a 58 on the FOTO to promote objective functional return to PLOF    Baseline 6/14: 28    Time 12    Period Weeks    Status New    Target Date 09/10/20      PT LONG TERM GOAL #2   Title Pt. will achieve R shoulder Flex+ABD AROM to 120 deg to promote independence with ADLs.    Baseline 06/18/20 abd: 72 deg./  flex: 65 deg.    Time 12    Period Weeks    Status New    Target Date 09/10/20      PT LONG TERM GOAL #3   Title Pt will improve R shoulder ER AAROM to 30 deg to promote independence with ADLs.    Baseline 11 deg. on 6/14    Time 12    Period Weeks    Status New    Target Date 09/10/20      PT LONG TERM GOAL #4   Title Pt will achieve R shoulder MMT 4/5 to facilitate workload tolerance similar to ADLs    Baseline N/T    Time 12    Period Weeks  Status New    Target Date 09/10/20      PT LONG TERM GOAL #5   Title Pt will  report <2/10 pain with reaching/lifting tasks to promote functional ability so that she can participate in household tasks.    Baseline 2/10 R shoulder pain currently at rest.    Time 12    Period Weeks    Status New    Target Date 09/10/20                   Plan - 07/16/20 1438     Clinical Impression Statement Pt tx load was progressed with increased AROM/AAROM reps, addition of wall ball ABCs, and reincorporation of finger ladder x8. Pt reported decreased stiffness post tx and ice compared to previous tx. Pt was educated on scar massage and decrease sling wear due to progress of pt and surgical protocol. All tx was agreeable to provided surgical protocol. Pt. will continue to benefit from skilled physical therapy to progress POC to address remaining deficits to facilitate maximum functional capacity for optimal personal health and wellness for ADLs.    Examination-Activity Limitations Hygiene/Grooming;Bathing;Self Feeding    Examination-Participation Restrictions Laundry;Cleaning;Driving;Meal Prep    Stability/Clinical Decision Making Evolving/Moderate complexity    Rehab Potential Good    Clinical Impairments Affecting Rehab Potential Negative: chronic nature of pain, comorbidities. Positive: motivated, family support    PT Frequency 2x / week    PT Duration 12 weeks    PT Treatment/Interventions Electrical Stimulation;Fluidtherapy;Moist Heat;Functional mobility training;Therapeutic activities;Therapeutic exercise;Patient/family education;Manual techniques;Scar mobilization;Passive range of motion;Balance training;Neuromuscular re-education;Cryotherapy;DME Instruction    PT Next Visit Plan Reassess incision, increase isometric therex for greater deltoid muslce activation.    PT Home Exercise Plan See HEP.  Access Code: [email or web address]     Consulted and Agree with Plan of Care Patient             Patient will benefit from skilled therapeutic intervention in order to improve the  following deficits and impairments:  Decreased endurance, Decreased mobility, Decreased skin integrity, Hypomobility, Impaired sensation, Improper body mechanics, Increased edema, Decreased scar mobility, Decreased range of motion, Decreased activity tolerance, Decreased strength, Impaired UE functional use, Pain  Visit Diagnosis: Acute pain of left shoulder  Decreased ROM of left shoulder  Decreased strength, endurance, and mobility     Problem List Patient Active Problem List   Diagnosis Date Noted   Pancreatic cyst 11/21/2018   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[place]) 09/21/2018   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   Leucopenia 07/06/2018   B12 deficiency 07/06/2018   Fatty liver disease, nonalcoholic 21/97/5883   Onychomycosis 07/20/2017   Hypertensive disorder 12/03/2016   Intracranial aneurysm 12/03/2016   Family history of brain aneurysm 08/07/2016   Obstructive sleep apnea 06/17/2016   Hyperglycemia, unspecified 01/29/2016   Morbid obesity with BMI of 45.0-49.9, adult ([place]) 04/08/2015   Myalgia 11/15/2014   Costochondritis 04/11/2014   Osteopenia 04/11/2014   Prediabetes 02/15/2014   Hypercholesterolemia 09/26/2013   Degenerative arthritis of left knee 06/02/2013   Pura Spice, PT, DPT # [id number] Fara Olden SPT 07/17/2020, 7:23 AM  [facility] [address] [place], [place], [address] Phone: [phone]   Fax:  [phone]  Name: Vernie Vinciguerra MRN: [id number] Date of Birth: 01-23-1950

## 2020-07-18 ENCOUNTER — Ambulatory Visit: Payer: Medicare HMO | Admitting: Physical Therapy

## 2020-07-18 ENCOUNTER — Encounter: Payer: Self-pay | Admitting: Physical Therapy

## 2020-07-18 ENCOUNTER — Other Ambulatory Visit: Payer: Self-pay

## 2020-07-18 DIAGNOSIS — M25612 Stiffness of left shoulder, not elsewhere classified: Secondary | ICD-10-CM

## 2020-07-18 DIAGNOSIS — Z7409 Other reduced mobility: Secondary | ICD-10-CM

## 2020-07-18 DIAGNOSIS — M25512 Pain in left shoulder: Secondary | ICD-10-CM

## 2020-07-18 NOTE — Therapy (Signed)
[facility] [address] [place], [place], [address] Phone: [phone]   Fax:  [phone]  Physical Therapy Treatment Physical Therapy Progress Note   Dates of reporting period  06/18/2020  to  07/18/2020  Patient Details  Name: Darlene Chen MRN: [id number] Date of Birth: [birth year] Referring Provider (PT): Johna Sheriff, MD   Encounter Date: 07/18/2020   PT End of Session - 07/18/20 1531     Visit Number 10    Number of Visits 24    Date for PT Re-Evaluation 09/10/20    Authorization - Visit Number 10    Authorization - Number of Visits 10    PT Start Time 2297    PT Stop Time 1528    PT Time Calculation (min) 57 min    Activity Tolerance Patient tolerated treatment well    Behavior During Therapy [facility] for tasks assessed/performed             Past Medical History:  Diagnosis Date   Diabetes mellitus without complication ([place])    High cholesterol    Hypertension    Neutropenia ([place])     Past Surgical History:  Procedure Laterality Date   arthroscopic knee surgery  on left 2000     BREAST BIOPSY Right 2019   bx/clip-neg   LEFT HEART CATH AND CORONARY ANGIOGRAPHY N/A 02/09/2017   Procedure: LEFT HEART CATH AND CORONARY ANGIOGRAPHY;  Surgeon: Yolonda Kida, MD;  Location: [place] [facility];  Service: Cardiovascular;  Laterality: N/A;   REDUCTION MAMMAPLASTY Bilateral    1990   TUBAL LIGATION  9892   UMBILICAL HERNIA REPAIR  2007    There were no vitals filed for this visit.   Subjective Assessment - 07/18/20 1439     Subjective Pt. entered PT without sling with no reports of pain.  Pt. remains compliant with HEP and has questions around scar massage.  No recent discharge noted in incision.    Pertinent History Pt. states return to surgeon 06/25/20 for check up and possible stable removal. Pt. reports no other surgeries in the last 5 years. However, plans on getting TKAs to both LE  once the shoulder rehab has resolved. Pt is currently not active 2/2 bilat knee pain.  Pt. is retired and lives with husband.    How long can you sit comfortably? WFL    How long can you stand comfortably? WFL with shoulder. limited by knee    How long can you walk comfortably? WFL    Patient Stated Goals Pt would like to return to independent house work and return to full functional activity.    Currently in Pain? No/denies    Pain Onset Today             Therapeutic Exercise:   Reassessment:   FOTO: 53  AROM: flexion 90 abd 80 ER 30   MMT: R shoulder flex: 3+/5 abd 3+/5 ER 4-/5 IR 3+/5 Elbow flex+Ext 4-/5.  Pain during lift house hold ADLs: 1/10   Pulley's flexion and abd 4 minutes each direction with verbal cueing to reduce upper trap involvement during movement.    Isometric against therapist resistance and contact cueing at the deltoid to ensure proper muscle firing: flexion/ext/ER/ABD 5 sec hold x 8.      Manual Therapy:   STM to proximal distal pectoralis, and anterior deltoid during shoulder abd PROM.   Scar mobility with pt education to start scar massage at home.  10 min ice at the end of treatment to reduce inflammation. Pt reports 0/10 pain NPS post ice. Not billed.          PT Education - 07/18/20 1525     Education Details Pt educated on continued scar massage. and updated HEP with isometrics to increase R shoulder strength.    Person(s) Educated Patient    Methods Explanation;Demonstration    Comprehension Verbalized understanding              PT Short Term Goals - 07/11/20 1520       PT SHORT TERM GOAL #1   Title Pt will begin UE AROM exercises to facilitate independence with light ADLs (Feeding, grooming) per post-surgical protocol.    Baseline Unable to complete AROM. 7/7: Pt is able to feed and wash face with her RUE    Time 3    Period Weeks    Status Achieved    Target Date 07/09/20      PT SHORT TERM GOAL #2    Title Pt will display proper strength, posture, and confidence with RUE to discontinue sling for increase access to independent ADL function.    Baseline Doned sling during all times of day/night besides HEP and grooming. 7/7 Pt currently does not wear the sling in the home during the day per-protocol.    Time 3    Period Weeks    Status Achieved    Target Date 07/09/20               PT Long Term Goals - 07/18/20 1440       PT LONG TERM GOAL #1   Title Pt will score a 58 on the FOTO to promote objective functional return to PLOF    Baseline 6/14: 28 7/14: 53    Time 12    Period Weeks    Status Partially Met    Target Date 09/10/20      PT LONG TERM GOAL #2   Title Pt. will achieve R shoulder Flex+ABD AROM to 120 deg to promote independence with ADLs.    Baseline 06/18/20 abd: 72 deg./  flex: 65 deg. 07/18/20: AROM: flex 90 abd 80    Time 12    Period Weeks    Status Partially Met    Target Date 09/10/20      PT LONG TERM GOAL #3   Title Pt will improve R shoulder ER AAROM to 30 deg to promote independence with ADLs.    Baseline 11 deg. on 6/14 7/14: 30 deg    Time 12    Period Weeks    Status New    Target Date 09/10/20      PT LONG TERM GOAL #4   Title Pt will achieve R shoulder MMT 4/5 to facilitate workload tolerance similar to ADLs    Baseline N/T 07/18/20 R shoulder flex: 3+/5 abd 3+/5 ER 4-/5 IR 3+/5 Elbow flex+Ext 4-/5.    Time 12    Period Weeks    Status New    Target Date 09/10/20      PT LONG TERM GOAL #5   Title Pt will report <2/10 pain with reaching/lifting tasks to promote functional ability so that she can participate in household tasks.    Baseline 2/10 R shoulder pain currently at rest. 7/14: 1/10 Pt states that is fearful of movement.    Time 12    Period Weeks    Status On-going    Target Date 09/10/20  Plan - 07/18/20 1538     Clinical Impression Statement Pt has displayed good progression during today's  reassessment compared to previously eval. Pt improved in all aspects. *Reassessment:     FOTO: 53    AROM: flexion 90 abd 80 ER 30     MMT: R shoulder flex: 3+/5 abd 3+/5 ER 4-/5 IR 3+/5 Elbow flex+Ext 4-/5.    Pain during lift house hold ADLs: 1/10*. Pt was given a isometric based HEP for increase strength gains at home. Pt continues to display R shoulder strength, endurance and mobility deficits. Pt. will continue to benefit from skilled physical therapy to progress POC to address remaining deficits to facilitate maximum functional capacity for optimal personal health and wellness for ADLs.    Examination-Activity Limitations Hygiene/Grooming;Bathing;Self Feeding    Examination-Participation Restrictions Laundry;Cleaning;Driving;Meal Prep    Stability/Clinical Decision Making Evolving/Moderate complexity    Clinical Decision Making Moderate    Rehab Potential Good    Clinical Impairments Affecting Rehab Potential Negative: chronic nature of pain, comorbidities. Positive: motivated, family support    PT Frequency 2x / week    PT Duration 12 weeks    PT Treatment/Interventions Electrical Stimulation;Fluidtherapy;Moist Heat;Functional mobility training;Therapeutic activities;Therapeutic exercise;Patient/family education;Manual techniques;Scar mobilization;Passive range of motion;Balance training;Neuromuscular re-education;Cryotherapy;DME Instruction    PT Next Visit Plan Reassess incision, increase isometric therex for greater deltoid muslce activation.    PT Home Exercise Plan See HEP.  Access Code: [email or web address]  updated isometric [email or web address]    Consulted and Agree with Plan of Care Patient             Patient will benefit from skilled therapeutic intervention in order to improve the following deficits and impairments:  Decreased endurance, Decreased mobility, Decreased skin integrity, Hypomobility, Impaired sensation, Improper body mechanics, Increased edema, Decreased scar mobility, Decreased range of  motion, Decreased activity tolerance, Decreased strength, Impaired UE functional use, Pain  Visit Diagnosis: Decreased ROM of left shoulder  Acute pain of left shoulder  Decreased strength, endurance, and mobility     Problem List Patient Active Problem List   Diagnosis Date Noted   Pancreatic cyst 11/21/2018   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[place]) 09/21/2018   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   Leucopenia 07/06/2018   B12 deficiency 07/06/2018   Fatty liver disease, nonalcoholic 30/09/2328   Onychomycosis 07/20/2017   Hypertensive disorder 12/03/2016   Intracranial aneurysm 12/03/2016   Family history of brain aneurysm 08/07/2016   Obstructive sleep apnea 06/17/2016   Hyperglycemia, unspecified 01/29/2016   Morbid obesity with BMI of 45.0-49.9, adult ([place]) 04/08/2015   Myalgia 11/15/2014   Costochondritis 04/11/2014   Osteopenia 04/11/2014   Prediabetes 02/15/2014   Hypercholesterolemia 09/26/2013   Degenerative arthritis of left knee 06/02/2013   Pura Spice, PT, DPT # [id number] Fara Olden, SPT 07/18/2020, 6:29 PM  [facility] [address] [place], [place], [address] Phone: [phone]   Fax:  [phone]  Name: Darlene Chen MRN: [id number] Date of Birth: January 20, 1950

## 2020-07-23 ENCOUNTER — Other Ambulatory Visit: Payer: Self-pay

## 2020-07-23 ENCOUNTER — Ambulatory Visit: Payer: Medicare HMO | Admitting: Physical Therapy

## 2020-07-23 DIAGNOSIS — M25612 Stiffness of left shoulder, not elsewhere classified: Secondary | ICD-10-CM

## 2020-07-23 DIAGNOSIS — M25512 Pain in left shoulder: Secondary | ICD-10-CM

## 2020-07-23 DIAGNOSIS — Z7409 Other reduced mobility: Secondary | ICD-10-CM

## 2020-07-23 NOTE — Therapy (Signed)
[facility] [address]. [place], [place], [address] Phone: [phone]   Fax:  [phone]  Physical Therapy Treatment  Patient Details  Name: Darlene Chen MRN: [id number] Date of Birth: [birth year] Referring Provider (PT): Johna Sheriff, MD   Encounter Date: 07/23/2020   PT End of Session - 07/23/20 1526     Visit Number 11    Number of Visits 24    Date for PT Re-Evaluation 09/10/20    Authorization - Visit Number 1    Authorization - Number of Visits 10    PT Start Time 1427    PT Stop Time 1516    PT Time Calculation (min) 49 min    Activity Tolerance Patient tolerated treatment well    Behavior During Therapy [facility] for tasks assessed/performed             Past Medical History:  Diagnosis Date   Diabetes mellitus without complication ([place])    High cholesterol    Hypertension    Neutropenia ([place])     Past Surgical History:  Procedure Laterality Date   arthroscopic knee surgery  on left 2000     BREAST BIOPSY Right 2019   bx/clip-neg   LEFT HEART CATH AND CORONARY ANGIOGRAPHY N/A 02/09/2017   Procedure: LEFT HEART CATH AND CORONARY ANGIOGRAPHY;  Surgeon: Yolonda Kida, MD;  Location: [facility];  Service: Cardiovascular;  Laterality: N/A;   REDUCTION MAMMAPLASTY Bilateral    1990   TUBAL LIGATION  9179   UMBILICAL HERNIA REPAIR  2007    There were no vitals filed for this visit.   Subjective Assessment - 07/23/20 1428     Subjective Pt presents to PT without sling and no pain. Pt states that she did not do the isometric exercise that were introduced last tx due to ackwardness of movemnet. Pt stated that she has noticed increase ability to do ADLs around the house.    Pertinent History Pt. states return to surgeon 06/25/20 for check up and possible stable removal. Pt. reports no other surgeries in the last 5 years. However, plans on getting TKAs to both LE once the shoulder  rehab has resolved. Pt is currently not active 2/2 bilat knee pain.  Pt. is retired and lives with husband.    Limitations House hold activities;Standing;Lifting    How long can you sit comfortably? WFL    How long can you stand comfortably? WFL with shoulder. limited by knee    How long can you walk comfortably? WFL    Patient Stated Goals Pt would like to return to independent house work and return to full functional activity.    Currently in Pain? No/denies    Pain Score 0-No pain    Pain Onset Today              Therapeutic Exercise:  Pulley's flexion and abd 4 minutes each direction with verbal cueing to reduce upper trap involvement during movement.  *The following exercises where done in supine due to increase tissue loading at 90 deg of shoulder flex which is less stable*  10 sec isometric hold with therapist resistance in each direction x 5. Pt reported no pain during movement however mod shoulder muscle fatigue.   Serratus ant push up 10 x 3 with contact cueing assist to ensure safety of Arm and decrease change of pt dropping arm during fatiguing movements.   Manual Therapy:  Prolonged Elbow ext stretching with intermitted bicep  muscle pinning to decrease TrP and tightness in the bicep. 9 mins   STM to distal pectoralis, anterior/mid deltoid, and scare massage. 9 mins.   *increase elbow ext and decrease stiffness in the shoulder and upper R arm post manuel intervention*.       PT Education - 07/23/20 1525     Education Details Pt was educated on importance of Isometic and to focus on Shoulder flex and abd against wall.    Person(s) Educated Patient    Methods Explanation;Demonstration    Comprehension Verbalized understanding              PT Short Term Goals - 07/11/20 1520       PT SHORT TERM GOAL #1   Title Pt will begin UE AROM exercises to facilitate independence with light ADLs (Feeding, grooming) per post-surgical protocol.    Baseline Unable to  complete AROM. 7/7: Pt is able to feed and wash face with her RUE    Time 3    Period Weeks    Status Achieved    Target Date 07/09/20      PT SHORT TERM GOAL #2   Title Pt will display proper strength, posture, and confidence with RUE to discontinue sling for increase access to independent ADL function.    Baseline Doned sling during all times of day/night besides HEP and grooming. 7/7 Pt currently does not wear the sling in the home during the day per-protocol.    Time 3    Period Weeks    Status Achieved    Target Date 07/09/20               PT Long Term Goals - 07/18/20 1440       PT LONG TERM GOAL #1   Title Pt will score a 58 on the FOTO to promote objective functional return to PLOF    Baseline 6/14: 28 7/14: 53    Time 12    Period Weeks    Status Partially Met    Target Date 09/10/20      PT LONG TERM GOAL #2   Title Pt. will achieve R shoulder Flex+ABD AROM to 120 deg to promote independence with ADLs.    Baseline 06/18/20 abd: 72 deg./  flex: 65 deg. 07/18/20: AROM: flex 90 abd 80    Time 12    Period Weeks    Status Partially Met    Target Date 09/10/20      PT LONG TERM GOAL #3   Title Pt will improve R shoulder ER AAROM to 30 deg to promote independence with ADLs.    Baseline 11 deg. on 6/14 7/14: 30 deg    Time 12    Period Weeks    Status New    Target Date 09/10/20      PT LONG TERM GOAL #4   Title Pt will achieve R shoulder MMT 4/5 to facilitate workload tolerance similar to ADLs    Baseline N/T 07/18/20 R shoulder flex: 3+/5 abd 3+/5 ER 4-/5 IR 3+/5 Elbow flex+Ext 4-/5.    Time 12    Period Weeks    Status New    Target Date 09/10/20      PT LONG TERM GOAL #5   Title Pt will report <2/10 pain with reaching/lifting tasks to promote functional ability so that she can participate in household tasks.    Baseline 2/10 R shoulder pain currently at rest. 7/14: 1/10 Pt states that is fearful of movement.  Time 12    Period Weeks    Status  On-going    Target Date 09/10/20                   Plan - 07/23/20 1532     Clinical Impression Statement Pt. displayed good tolerance during today tx with addition of isometric in all direction at 90 deg of shoulder flexion, serratus push ups in supine, and prolonged R elbow ext with muscle pinning to address tight R elbow and unable to achieve terminal ext. Pt was able to achieve terminal elbow ext and decrease tightness in the R biceps and ant shoulder. Pt. will continue to benefit from skilled physical therapy to progress POC to address remaining deficits to facilitate maximum functional capacity for optimal personal health and wellness for ADLs    Examination-Activity Limitations Hygiene/Grooming;Bathing;Self Feeding    Examination-Participation Restrictions Laundry;Cleaning;Driving;Meal Prep    Stability/Clinical Decision Making Evolving/Moderate complexity    Clinical Decision Making Moderate    Rehab Potential Good    Clinical Impairments Affecting Rehab Potential Negative: chronic nature of pain, comorbidities. Positive: motivated, family support    PT Frequency 2x / week    PT Duration 12 weeks    PT Treatment/Interventions Electrical Stimulation;Fluidtherapy;Moist Heat;Functional mobility training;Therapeutic activities;Therapeutic exercise;Patient/family education;Manual techniques;Scar mobilization;Passive range of motion;Balance training;Neuromuscular re-education;Cryotherapy;DME Instruction    PT Next Visit Plan Increase isometric therex for greater deltoid muslce activation.    PT Home Exercise Plan See HEP.  Access Code: [email or web address]  updated isometric [email or web address]    Consulted and Agree with Plan of Care Patient             Patient will benefit from skilled therapeutic intervention in order to improve the following deficits and impairments:  Decreased endurance, Decreased mobility, Decreased skin integrity, Hypomobility, Impaired sensation, Improper body mechanics,  Increased edema, Decreased scar mobility, Decreased range of motion, Decreased activity tolerance, Decreased strength, Impaired UE functional use, Pain  Visit Diagnosis: Decreased ROM of left shoulder  Acute pain of left shoulder  Decreased strength, endurance, and mobility     Problem List Patient Active Problem List   Diagnosis Date Noted   Pancreatic cyst 11/21/2018   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[place]) 09/21/2018   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   Leucopenia 07/06/2018   B12 deficiency 07/06/2018   Fatty liver disease, nonalcoholic 32/20/2542   Onychomycosis 07/20/2017   Hypertensive disorder 12/03/2016   Intracranial aneurysm 12/03/2016   Family history of brain aneurysm 08/07/2016   Obstructive sleep apnea 06/17/2016   Hyperglycemia, unspecified 01/29/2016   Morbid obesity with BMI of 45.0-49.9, adult ([place]) 04/08/2015   Myalgia 11/15/2014   Costochondritis 04/11/2014   Osteopenia 04/11/2014   Prediabetes 02/15/2014   Hypercholesterolemia 09/26/2013   Degenerative arthritis of left knee 06/02/2013   Pura Spice, PT, DPT # [id number] Fara Olden SPT 07/24/2020, 8:16 AM  [facility] [address] [place], [place], [address] Phone: [phone]   Fax:  [phone]  Name: Darlene Chen MRN: [id number] Date of Birth: 07/29/1950

## 2020-07-24 ENCOUNTER — Encounter: Payer: Self-pay | Admitting: Physical Therapy

## 2020-07-25 ENCOUNTER — Ambulatory Visit: Payer: Medicare HMO | Admitting: Physical Therapy

## 2020-07-25 ENCOUNTER — Other Ambulatory Visit: Payer: Self-pay

## 2020-07-25 DIAGNOSIS — M25512 Pain in left shoulder: Secondary | ICD-10-CM | POA: Diagnosis not present

## 2020-07-25 DIAGNOSIS — M25612 Stiffness of left shoulder, not elsewhere classified: Secondary | ICD-10-CM

## 2020-07-25 DIAGNOSIS — R6889 Other general symptoms and signs: Secondary | ICD-10-CM

## 2020-07-26 ENCOUNTER — Encounter: Payer: Self-pay | Admitting: Physical Therapy

## 2020-07-26 NOTE — Therapy (Signed)
[facility] [address] [place], [place], [address] Phone: [phone]   Fax:  [phone]  Physical Therapy Treatment  Patient Details  Name: Darlene Chen MRN: [id number] Date of Birth: [birth year] Referring Provider (PT): Johna Sheriff, MD   Encounter Date: 07/25/2020   PT End of Session - 07/26/20 0704     Visit Number 12    Number of Visits 24    Date for PT Re-Evaluation 09/10/20    Authorization - Visit Number 2    Authorization - Number of Visits 10    PT Start Time 1427    PT Stop Time 1521    PT Time Calculation (min) 54 min    Activity Tolerance Patient tolerated treatment well    Behavior During Therapy [facility] for tasks assessed/performed             Past Medical History:  Diagnosis Date   Diabetes mellitus without complication ([place])    High cholesterol    Hypertension    Neutropenia ([place])     Past Surgical History:  Procedure Laterality Date   arthroscopic knee surgery  on left 2000     BREAST BIOPSY Right 2019   bx/clip-neg   LEFT HEART CATH AND CORONARY ANGIOGRAPHY N/A 02/09/2017   Procedure: LEFT HEART CATH AND CORONARY ANGIOGRAPHY;  Surgeon: Yolonda Kida, MD;  Location: [facility];  Service: Cardiovascular;  Laterality: N/A;   REDUCTION MAMMAPLASTY Bilateral    1990   TUBAL LIGATION  6283   UMBILICAL HERNIA REPAIR  2007    There were no vitals filed for this visit.   Subjective Assessment - 07/26/20 0700     Subjective Pt presents to tx without complaints of pain. Pt stated that her check up with the surgeon went well. Pt stated that strength is her largest deficits at this time. Pt stated no furher issues with her incision and has return to placing coco butter to her R shoulder/incision. Pt stated that she has begun returning to cooking related tasks.    Pertinent History Pt. states return to surgeon 06/25/20 for check up and possible stable removal. Pt. reports  no other surgeries in the last 5 years. However, plans on getting TKAs to both LE once the shoulder rehab has resolved. Pt is currently not active 2/2 bilat knee pain.  Pt. is retired and lives with husband.    Limitations House hold activities;Standing;Lifting    How long can you sit comfortably? WFL    How long can you stand comfortably? WFL with shoulder. limited by knee    How long can you walk comfortably? WFL    Patient Stated Goals Pt would like to return to independent house work and return to full functional activity.    Currently in Pain? No/denies    Pain Score 0-No pain    Pain Onset Today             Therapeutic Exercise:   Pulleys flexion/abd 4 mins in each direction with verbal cueing to ensure proper hold at end range without forceful OP.     repeated HHB motion x30 with slight increase in ROM from gluteal fold to min buttock. Verbal cueing for proper technique.  SEE updated HEP: [id number] Sets and reps perfrom with RTB to ensure proper/safe technique that is agreeable with the surgical protocol.   Manuel Therapy:   Prolonged stretching with sustained pressure at endrange without forceful OP. R shoulder flexion/abd/IR/ER. 9 mins  Incision Massage with verbal cueing for proper pressure and aggression so that pt will complete at home. 9  mins    *All tx completed was agreeable with surgical protocol.*         PT Education - 07/26/20 0703     Education Details Pt was educated on new HEP targeting scapular strengthening and shoulder IR.    Person(s) Educated Patient    Methods Explanation;Demonstration;Verbal cues;Tactile cues;Handout    Comprehension Verbalized understanding;Returned demonstration              PT Short Term Goals - 07/11/20 1520       PT SHORT TERM GOAL #1   Title Pt will begin UE AROM exercises to facilitate independence with light ADLs (Feeding, grooming) per post-surgical protocol.    Baseline Unable to complete AROM.  7/7: Pt is able to feed and wash face with her RUE    Time 3    Period Weeks    Status Achieved    Target Date 07/09/20      PT SHORT TERM GOAL #2   Title Pt will display proper strength, posture, and confidence with RUE to discontinue sling for increase access to independent ADL function.    Baseline Doned sling during all times of day/night besides HEP and grooming. 7/7 Pt currently does not wear the sling in the home during the day per-protocol.    Time 3    Period Weeks    Status Achieved    Target Date 07/09/20               PT Long Term Goals - 07/18/20 1440       PT LONG TERM GOAL #1   Title Pt will score a 58 on the FOTO to promote objective functional return to PLOF    Baseline 6/14: 28 7/14: 53    Time 12    Period Weeks    Status Partially Met    Target Date 09/10/20      PT LONG TERM GOAL #2   Title Pt. will achieve R shoulder Flex+ABD AROM to 120 deg to promote independence with ADLs.    Baseline 06/18/20 abd: 72 deg./  flex: 65 deg. 07/18/20: AROM: flex 90 abd 80    Time 12    Period Weeks    Status Partially Met    Target Date 09/10/20      PT LONG TERM GOAL #3   Title Pt will improve R shoulder ER AAROM to 30 deg to promote independence with ADLs.    Baseline 11 deg. on 6/14 7/14: 30 deg    Time 12    Period Weeks    Status New    Target Date 09/10/20      PT LONG TERM GOAL #4   Title Pt will achieve R shoulder MMT 4/5 to facilitate workload tolerance similar to ADLs    Baseline N/T 07/18/20 R shoulder flex: 3+/5 abd 3+/5 ER 4-/5 IR 3+/5 Elbow flex+Ext 4-/5.    Time 12    Period Weeks    Status New    Target Date 09/10/20      PT LONG TERM GOAL #5   Title Pt will report <2/10 pain with reaching/lifting tasks to promote functional ability so that she can participate in household tasks.    Baseline 2/10 R shoulder pain currently at rest. 7/14: 1/10 Pt states that is fearful of movement.    Time 12    Period Weeks  Status On-going    Target  Date 09/10/20                   Plan - 07/26/20 0705     Clinical Impression Statement Pt displayed good tolerance to addition of new IR and scapular strengthening exercises per surgical protocol. Pt continues to display R shoulder ROM/ strength, and ROM deficits that are expected during the time of the post-op rehab. Pt. will continue to benefit from skilled physical therapy to progress POC to address remaining deficits to facilitate maximum functional capacity for optimal personal health and wellness for ADLs.    Examination-Activity Limitations Hygiene/Grooming;Bathing;Self Feeding    Examination-Participation Restrictions Laundry;Cleaning;Driving;Meal Prep    Stability/Clinical Decision Making Evolving/Moderate complexity    Clinical Decision Making Moderate    Rehab Potential Good    Clinical Impairments Affecting Rehab Potential Negative: chronic nature of pain, comorbidities. Positive: motivated, family support    PT Frequency 2x / week    PT Duration 12 weeks    PT Treatment/Interventions Electrical Stimulation;Fluidtherapy;Moist Heat;Functional mobility training;Therapeutic activities;Therapeutic exercise;Patient/family education;Manual techniques;Scar mobilization;Passive range of motion;Balance training;Neuromuscular re-education;Cryotherapy;DME Instruction    PT Next Visit Plan Reassess all HEP and condense to one hand out for increased aherence.    PT Home Exercise Plan See HEP.  Access Code: [email or web address]  updated isometric [email or web address] updated band: [id number]    Consulted and Agree with Plan of Care Patient             Patient will benefit from skilled therapeutic intervention in order to improve the following deficits and impairments:  Decreased endurance, Decreased mobility, Decreased skin integrity, Hypomobility, Impaired sensation, Improper body mechanics, Increased edema, Decreased scar mobility, Decreased range of motion, Decreased activity tolerance, Decreased strength,  Impaired UE functional use, Pain  Visit Diagnosis: Decreased ROM of left shoulder  Decreased strength, endurance, and mobility  Acute pain of left shoulder     Problem List Patient Active Problem List   Diagnosis Date Noted   Pancreatic cyst 11/21/2018   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[place]) 09/21/2018   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   Leucopenia 07/06/2018   B12 deficiency 07/06/2018   Fatty liver disease, nonalcoholic 50/27/7412   Onychomycosis 07/20/2017   Hypertensive disorder 12/03/2016   Intracranial aneurysm 12/03/2016   Family history of brain aneurysm 08/07/2016   Obstructive sleep apnea 06/17/2016   Hyperglycemia, unspecified 01/29/2016   Morbid obesity with BMI of 45.0-49.9, adult ([place]) 04/08/2015   Myalgia 11/15/2014   Costochondritis 04/11/2014   Osteopenia 04/11/2014   Prediabetes 02/15/2014   Hypercholesterolemia 09/26/2013   Degenerative arthritis of left knee 06/02/2013   Pura Spice, PT, DPT # [id number] Fara Olden SPT 07/26/2020, 8:43 PM  [facility] [address]. [place], [place], [address] Phone: [phone]   Fax:  [phone]  Name: Darlene Chen MRN: [id number] Date of Birth: Feb 08, 1950

## 2020-07-30 ENCOUNTER — Other Ambulatory Visit: Payer: Self-pay

## 2020-07-30 ENCOUNTER — Encounter: Payer: Self-pay | Admitting: Physical Therapy

## 2020-07-30 ENCOUNTER — Ambulatory Visit: Payer: Medicare HMO | Admitting: Physical Therapy

## 2020-07-30 DIAGNOSIS — M25512 Pain in left shoulder: Secondary | ICD-10-CM | POA: Diagnosis not present

## 2020-07-30 DIAGNOSIS — M25612 Stiffness of left shoulder, not elsewhere classified: Secondary | ICD-10-CM

## 2020-07-30 DIAGNOSIS — R6889 Other general symptoms and signs: Secondary | ICD-10-CM

## 2020-07-31 NOTE — Therapy (Signed)
[facility] [address] [place], [place], [address] Phone: [phone]   Fax:  [phone]  Physical Therapy Treatment  Patient Details  Name: Darlene Chen MRN: [id number] Date of Birth: 1950-08-07 Referring Provider (PT): Johna Sheriff, MD   Encounter Date: 07/30/2020   PT End of Session - 07/31/20 0713     Visit Number 13    Number of Visits 24    Date for PT Re-Evaluation 09/10/20    Authorization - Visit Number 3    Authorization - Number of Visits 10    PT Start Time 6222    PT Stop Time 1433    PT Time Calculation (min) 51 min    Activity Tolerance Patient tolerated treatment well    Behavior During Therapy [facility] for tasks assessed/performed             Past Medical History:  Diagnosis Date   Diabetes mellitus without complication ([place])    High cholesterol    Hypertension    Neutropenia ([place])     Past Surgical History:  Procedure Laterality Date   arthroscopic knee surgery  on left 2000     BREAST BIOPSY Right 2019   bx/clip-neg   LEFT HEART CATH AND CORONARY ANGIOGRAPHY N/A 02/09/2017   Procedure: LEFT HEART CATH AND CORONARY ANGIOGRAPHY;  Surgeon: Yolonda Kida, MD;  Location: [place] [facility];  Service: Cardiovascular;  Laterality: N/A;   REDUCTION MAMMAPLASTY Bilateral    1990   TUBAL LIGATION  9798   UMBILICAL HERNIA REPAIR  2007    There were no vitals filed for this visit.   Subjective Assessment - 07/30/20 1345     Subjective Pt presented to tx with 3/10 R shoulder pain. Pt stated that she has returned to driving without increase in pain, however, she is currently only driving short distances at this time. Pt has brought her old HEPs to create 1 optimal HEP.    Pertinent History Pt. states return to surgeon 06/25/20 for check up and possible stable removal. Pt. reports no other surgeries in the last 5 years. However, plans on getting TKAs to both LE once the shoulder  rehab has resolved. Pt is currently not active 2/2 bilat knee pain.  Pt. is retired and lives with husband.    Limitations House hold activities;Standing;Lifting    How long can you sit comfortably? WFL    How long can you stand comfortably? WFL with shoulder. limited by knee    How long can you walk comfortably? WFL    Patient Stated Goals Pt would like to return to independent house work and return to full functional activity.    Currently in Pain? Yes    Pain Score 3     Pain Location Shoulder    Pain Orientation Right    Pain Onset Today                Therapeutic Exercise:     Pulleys flexion/abd/scaption 4 mins in each direction with verbal cueing to ensure proper hold at end range without forceful OP.     SEE updated HEP: [id number] Sets and reps perfrom with RTB to ensure proper/safe technique that is agreeable with the surgical protocol.    Manual Therapy:     Prolonged stretching with sustained pressure at endrange without forceful OP. R shoulder flexion/abd/IR/ER. 9 mins     Incision Massage with verbal cueing for proper pressure and aggression so that pt will complete  at home. 4  mins  Transverse friction massage to distal pectoralis and underlying scar tissue in the ant shoulder. 5 min. Pt was educated on possible increase in soreness this even and the next day.        *All tx completed was agreeable with surgical protocol.*         PT Education - 07/31/20 0712     Education Details Pt was educated on updated HEP for optimal and safe tissue loading during current stage of rehab.    Person(s) Educated Patient    Methods Explanation;Demonstration;Tactile cues;Verbal cues;Handout    Comprehension Verbalized understanding;Returned demonstration              PT Short Term Goals - 07/11/20 1520       PT SHORT TERM GOAL #1   Title Pt will begin UE AROM exercises to facilitate independence with light ADLs (Feeding, grooming) per post-surgical  protocol.    Baseline Unable to complete AROM. 7/7: Pt is able to feed and wash face with her RUE    Time 3    Period Weeks    Status Achieved    Target Date 07/09/20      PT SHORT TERM GOAL #2   Title Pt will display proper strength, posture, and confidence with RUE to discontinue sling for increase access to independent ADL function.    Baseline Doned sling during all times of day/night besides HEP and grooming. 7/7 Pt currently does not wear the sling in the home during the day per-protocol.    Time 3    Period Weeks    Status Achieved    Target Date 07/09/20               PT Long Term Goals - 07/18/20 1440       PT LONG TERM GOAL #1   Title Pt will score a 58 on the FOTO to promote objective functional return to PLOF    Baseline 6/14: 28 7/14: 53    Time 12    Period Weeks    Status Partially Met    Target Date 09/10/20      PT LONG TERM GOAL #2   Title Pt. will achieve R shoulder Flex+ABD AROM to 120 deg to promote independence with ADLs.    Baseline 06/18/20 abd: 72 deg./  flex: 65 deg. 07/18/20: AROM: flex 90 abd 80    Time 12    Period Weeks    Status Partially Met    Target Date 09/10/20      PT LONG TERM GOAL #3   Title Pt will improve R shoulder ER AAROM to 30 deg to promote independence with ADLs.    Baseline 11 deg. on 6/14 7/14: 30 deg    Time 12    Period Weeks    Status New    Target Date 09/10/20      PT LONG TERM GOAL #4   Title Pt will achieve R shoulder MMT 4/5 to facilitate workload tolerance similar to ADLs    Baseline N/T 07/18/20 R shoulder flex: 3+/5 abd 3+/5 ER 4-/5 IR 3+/5 Elbow flex+Ext 4-/5.    Time 12    Period Weeks    Status New    Target Date 09/10/20      PT LONG TERM GOAL #5   Title Pt will report <2/10 pain with reaching/lifting tasks to promote functional ability so that she can participate in household tasks.    Baseline 2/10 R shoulder pain  currently at rest. 7/14: 1/10 Pt states that is fearful of movement.    Time 12     Period Weeks    Status On-going    Target Date 09/10/20                   Plan - 07/31/20 0714     Clinical Impression Statement Pt displayed excellent adherence and proper body mechanics during training of new HEP. Pt was able to complete all exercise sets/reps without increase in shoulder pain. Pt cont to display poor AROM strength and endurance of the R shoulder. Pt stated decrease in R shoulder pain/stiffness from 3/10 to 0/10 post manual intervention. Pt. will continue to benefit from skilled physical therapy to progress POC to address remaining deficits to facilitate maximum functional capacity for optimal personal health and wellness for ADLs.    Examination-Activity Limitations Hygiene/Grooming;Bathing;Self Feeding    Examination-Participation Restrictions Laundry;Cleaning;Driving;Meal Prep    Stability/Clinical Decision Making Evolving/Moderate complexity    Clinical Decision Making Moderate    Rehab Potential Good    Clinical Impairments Affecting Rehab Potential Negative: chronic nature of pain, comorbidities. Positive: motivated, family support    PT Frequency 2x / week    PT Duration 12 weeks    PT Treatment/Interventions Electrical Stimulation;Fluidtherapy;Moist Heat;Functional mobility training;Therapeutic activities;Therapeutic exercise;Patient/family education;Manual techniques;Scar mobilization;Passive range of motion;Balance training;Neuromuscular re-education;Cryotherapy;DME Instruction    PT Next Visit Plan Reassess all HEP and condense to one hand out for increased aherence.    PT Home Exercise Plan See HEP.  Access Code: [email or web address]  updated isometric [email or web address] updated band: [id number]    Consulted and Agree with Plan of Care Patient             Patient will benefit from skilled therapeutic intervention in order to improve the following deficits and impairments:  Decreased endurance, Decreased mobility, Decreased skin integrity, Hypomobility, Impaired  sensation, Improper body mechanics, Increased edema, Decreased scar mobility, Decreased range of motion, Decreased activity tolerance, Decreased strength, Impaired UE functional use, Pain  Visit Diagnosis: Decreased ROM of left shoulder  Acute pain of left shoulder  Decreased strength, endurance, and mobility     Problem List Patient Active Problem List   Diagnosis Date Noted   Pancreatic cyst 11/21/2018   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[place]) 09/21/2018   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   Leucopenia 07/06/2018   B12 deficiency 07/06/2018   Fatty liver disease, nonalcoholic 43/32/9518   Onychomycosis 07/20/2017   Hypertensive disorder 12/03/2016   Intracranial aneurysm 12/03/2016   Family history of brain aneurysm 08/07/2016   Obstructive sleep apnea 06/17/2016   Hyperglycemia, unspecified 01/29/2016   Morbid obesity with BMI of 45.0-49.9, adult ([place]) 04/08/2015   Myalgia 11/15/2014   Costochondritis 04/11/2014   Osteopenia 04/11/2014   Prediabetes 02/15/2014   Hypercholesterolemia 09/26/2013   Degenerative arthritis of left knee 06/02/2013   Pura Spice, PT, DPT # [id number] Fara Olden SPT 07/31/2020, 8:21 AM  [place] [facility] [address]. [place], [place], [address] Phone: [phone]   Fax:  [phone]  Name: Darlene Chen MRN: [id number] Date of Birth: 1950/04/25

## 2020-08-01 ENCOUNTER — Encounter: Payer: Medicare HMO | Admitting: Physical Therapy

## 2020-08-06 ENCOUNTER — Encounter: Payer: Self-pay | Admitting: Physical Therapy

## 2020-08-06 ENCOUNTER — Ambulatory Visit: Payer: Medicare HMO | Attending: Orthopedic Surgery | Admitting: Physical Therapy

## 2020-08-06 ENCOUNTER — Other Ambulatory Visit: Payer: Self-pay

## 2020-08-06 DIAGNOSIS — R6889 Other general symptoms and signs: Secondary | ICD-10-CM | POA: Insufficient documentation

## 2020-08-06 DIAGNOSIS — M25612 Stiffness of left shoulder, not elsewhere classified: Secondary | ICD-10-CM | POA: Insufficient documentation

## 2020-08-06 DIAGNOSIS — Z7409 Other reduced mobility: Secondary | ICD-10-CM | POA: Insufficient documentation

## 2020-08-06 DIAGNOSIS — M25512 Pain in left shoulder: Secondary | ICD-10-CM | POA: Insufficient documentation

## 2020-08-06 DIAGNOSIS — R531 Weakness: Secondary | ICD-10-CM | POA: Insufficient documentation

## 2020-08-06 NOTE — Therapy (Signed)
[facility] [address]. [place], [place], [address] Phone: [phone]   Fax:  [phone]  Physical Therapy Treatment  Patient Details  Name: Darlene Chen MRN: [id number] Date of Birth: March 31, 1950 Referring Provider (PT): Johna Sheriff, MD   Encounter Date: 08/06/2020   PT End of Session - 08/06/20 1450     Visit Number 14    Number of Visits 24    Date for PT Re-Evaluation 09/10/20    Authorization - Visit Number 4    Authorization - Number of Visits 10    PT Start Time 5093    PT Stop Time 2671    PT Time Calculation (min) 52 min    Activity Tolerance Patient tolerated treatment well;No increased pain    Behavior During Therapy WFL for tasks assessed/performed             Past Medical History:  Diagnosis Date   Diabetes mellitus without complication ([facility])    High cholesterol    Hypertension    Neutropenia ([facility])     Past Surgical History:  Procedure Laterality Date   arthroscopic knee surgery  on left 2000     BREAST BIOPSY Right 2019   bx/clip-neg   LEFT HEART CATH AND CORONARY ANGIOGRAPHY N/A 02/09/2017   Procedure: LEFT HEART CATH AND CORONARY ANGIOGRAPHY;  Surgeon: Yolonda Kida, MD;  Location: [place] [facility];  Service: Cardiovascular;  Laterality: N/A;   REDUCTION MAMMAPLASTY Bilateral    1990   TUBAL LIGATION  2458   UMBILICAL HERNIA REPAIR  2007    There were no vitals filed for this visit.   Subjective Assessment - 08/06/20 1349     Subjective Pt. presents to tx without any shoulder pain. Pt stated good adherence to the new HEP. Pt stated minor soreness/pulling during the band exercises. Pt stated ther ER rotation with band resistance is difficult.    Pertinent History Pt. states return to surgeon 06/25/20 for check up and possible stable removal. Pt. reports no other surgeries in the last 5 years. However, plans on getting TKAs to both LE once the shoulder rehab has  resolved. Pt is currently not active 2/2 bilat knee pain.  Pt. is retired and lives with husband.    Limitations House hold activities;Standing;Lifting    How long can you sit comfortably? WFL    How long can you stand comfortably? WFL with shoulder. limited by knee    How long can you walk comfortably? WFL    Patient Stated Goals Pt would like to return to independent house work and return to full functional activity.    Currently in Pain? No/denies    Pain Score 0-No pain    Pain Onset --                Therapeutic Exercise:   Pulleys flexion/abd/scaption 4 mins in each direction with verbal cueing to ensure proper hold at end range without forceful OP.    Nautilus:   *Verbal and contact cueing needed to ensure proper muscle activation and safety of resisted movement*  Standing Row 20# x30,   Bilat shoulder ext 10 #x30  Unilat shoulder adduction 10 # x 30  with therapist abd block to ensure muscle activation without an increase in pain.    5x 10 sec isometric hold with 10#. T    Manual Therapy:   Transverse friction massage to distal pectoralis and underlying scar tissue in the ant shoulder with prolonged  stretching to promote increase shoulder abd. 11 min. Pt was educated on possible increase in soreness this even and the next day.     *All tx completed was agreeable with surgical protocol.*      PT Education - 08/06/20 1357     Education Details Continued education on proper form and technique with band resisted HEP.    Person(s) Educated Patient    Methods Explanation;Demonstration;Tactile cues;Verbal cues    Comprehension Verbalized understanding;Returned demonstration              PT Short Term Goals - 07/11/20 1520       PT SHORT TERM GOAL #1   Title Pt will begin UE AROM exercises to facilitate independence with light ADLs (Feeding, grooming) per post-surgical protocol.    Baseline Unable to complete AROM. 7/7: Pt is able to feed and wash face  with her RUE    Time 3    Period Weeks    Status Achieved    Target Date 07/09/20      PT SHORT TERM GOAL #2   Title Pt will display proper strength, posture, and confidence with RUE to discontinue sling for increase access to independent ADL function.    Baseline Doned sling during all times of day/night besides HEP and grooming. 7/7 Pt currently does not wear the sling in the home during the day per-protocol.    Time 3    Period Weeks    Status Achieved    Target Date 07/09/20               PT Long Term Goals - 07/18/20 1440       PT LONG TERM GOAL #1   Title Pt will score a 58 on the FOTO to promote objective functional return to PLOF    Baseline 6/14: 28 7/14: 53    Time 12    Period Weeks    Status Partially Met    Target Date 09/10/20      PT LONG TERM GOAL #2   Title Pt. will achieve R shoulder Flex+ABD AROM to 120 deg to promote independence with ADLs.    Baseline 06/18/20 abd: 72 deg./  flex: 65 deg. 07/18/20: AROM: flex 90 abd 80    Time 12    Period Weeks    Status Partially Met    Target Date 09/10/20      PT LONG TERM GOAL #3   Title Pt will improve R shoulder ER AAROM to 30 deg to promote independence with ADLs.    Baseline 11 deg. on 6/14 7/14: 30 deg    Time 12    Period Weeks    Status New    Target Date 09/10/20      PT LONG TERM GOAL #4   Title Pt will achieve R shoulder MMT 4/5 to facilitate workload tolerance similar to ADLs    Baseline N/T 07/18/20 R shoulder flex: 3+/5 abd 3+/5 ER 4-/5 IR 3+/5 Elbow flex+Ext 4-/5.    Time 12    Period Weeks    Status New    Target Date 09/10/20      PT LONG TERM GOAL #5   Title Pt will report <2/10 pain with reaching/lifting tasks to promote functional ability so that she can participate in household tasks.    Baseline 2/10 R shoulder pain currently at rest. 7/14: 1/10 Pt states that is fearful of movement.    Time 12    Period Weeks    Status  On-going    Target Date 09/10/20                    Plan - 08/06/20 1451     Clinical Impression Statement Pt displayed excellent tolerance to tx with addition of Nautilus resistance exercises. Pt stated no increase in pain after therex. Pt continues to state soreness and stiffness during passive shoulder and and compensates with upper trap musculature. Pt stated decrease in stiffness post STM transverse friction massage to distal pectoralis. Pt. will continue to benefit from skilled physical therapy to progress POC to address remaining deficits to facilitate maximum functional capacity for optimal personal health and wellness for ADLs    Examination-Activity Limitations Hygiene/Grooming;Bathing;Self Feeding    Examination-Participation Restrictions Laundry;Cleaning;Driving;Meal Prep    Stability/Clinical Decision Making Evolving/Moderate complexity    Clinical Decision Making Moderate    Rehab Potential Good    Clinical Impairments Affecting Rehab Potential Negative: chronic nature of pain, comorbidities. Positive: motivated, family support    PT Frequency 2x / week    PT Duration 12 weeks    PT Treatment/Interventions Electrical Stimulation;Fluidtherapy;Moist Heat;Functional mobility training;Therapeutic activities;Therapeutic exercise;Patient/family education;Manual techniques;Scar mobilization;Passive range of motion;Balance training;Neuromuscular re-education;Cryotherapy;DME Instruction    PT Next Visit Plan Reassess soreness and progression Nautilus therex loading.  Overhead functional tasks.    PT Home Exercise Plan See HEP.  Access Code: [email or web address]  updated isometric [email or web address] updated band: [id number]    Consulted and Agree with Plan of Care Patient             Patient will benefit from skilled therapeutic intervention in order to improve the following deficits and impairments:  Decreased endurance, Decreased mobility, Decreased skin integrity, Hypomobility, Impaired sensation, Improper body mechanics, Increased edema,  Decreased scar mobility, Decreased range of motion, Decreased activity tolerance, Decreased strength, Impaired UE functional use, Pain  Visit Diagnosis: Decreased ROM of left shoulder  Decreased strength, endurance, and mobility  Acute pain of left shoulder     Problem List Patient Active Problem List   Diagnosis Date Noted   Pancreatic cyst 11/21/2018   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[place]) 09/21/2018   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   Leucopenia 07/06/2018   B12 deficiency 07/06/2018   Fatty liver disease, nonalcoholic 33/54/5625   Onychomycosis 07/20/2017   Hypertensive disorder 12/03/2016   Intracranial aneurysm 12/03/2016   Family history of brain aneurysm 08/07/2016   Obstructive sleep apnea 06/17/2016   Hyperglycemia, unspecified 01/29/2016   Morbid obesity with BMI of 45.0-49.9, adult ([place]) 04/08/2015   Myalgia 11/15/2014   Costochondritis 04/11/2014   Osteopenia 04/11/2014   Prediabetes 02/15/2014   Hypercholesterolemia 09/26/2013   Degenerative arthritis of left knee 06/02/2013   Pura Spice, PT, DPT # [id number] Fara Olden SPT 08/06/2020, 3:54 PM  [place] [facility] [address]. [place], [place], [address] Phone: [phone]   Fax:  [phone]  Name: Darlene Chen MRN: [id number] Date of Birth: 09/08/1950

## 2020-08-08 ENCOUNTER — Ambulatory Visit: Payer: Medicare HMO | Admitting: Physical Therapy

## 2020-08-08 ENCOUNTER — Other Ambulatory Visit: Payer: Self-pay

## 2020-08-08 ENCOUNTER — Encounter: Payer: Self-pay | Admitting: Physical Therapy

## 2020-08-08 DIAGNOSIS — R6889 Other general symptoms and signs: Secondary | ICD-10-CM

## 2020-08-08 DIAGNOSIS — M25512 Pain in left shoulder: Secondary | ICD-10-CM

## 2020-08-08 DIAGNOSIS — Z7409 Other reduced mobility: Secondary | ICD-10-CM

## 2020-08-08 DIAGNOSIS — M25612 Stiffness of left shoulder, not elsewhere classified: Secondary | ICD-10-CM | POA: Diagnosis not present

## 2020-08-08 NOTE — Therapy (Signed)
[facility] [address]. [place], [place], [address] Phone: [phone]   Fax:  [phone]  Physical Therapy Treatment  Patient Details  Name: Darlene Chen MRN: [id number] Date of Birth: [birth year] Referring Provider (PT): Johna Sheriff, MD   Encounter Date: 08/08/2020   PT End of Session - 08/08/20 1511     Visit Number 15    Number of Visits 24    Date for PT Re-Evaluation 09/10/20    Authorization - Visit Number 5    Authorization - Number of Visits 10    PT Start Time 0146    PT Stop Time 0238    PT Time Calculation (min) 52 min    Activity Tolerance Patient tolerated treatment well;No increased pain    Behavior During Therapy WFL for tasks assessed/performed             Past Medical History:  Diagnosis Date   Diabetes mellitus without complication (HCC)    High cholesterol    Hypertension    Neutropenia (HCC)     Past Surgical History:  Procedure Laterality Date   arthroscopic knee surgery  on left 2000     BREAST BIOPSY Right 2019   bx/clip-neg   LEFT HEART CATH AND CORONARY ANGIOGRAPHY N/A 02/09/2017   Procedure: LEFT HEART CATH AND CORONARY ANGIOGRAPHY;  Surgeon: Yolonda Kida, MD;  Location: [facility];  Service: Cardiovascular;  Laterality: N/A;   REDUCTION MAMMAPLASTY Bilateral    1990   TUBAL LIGATION  4401   UMBILICAL HERNIA REPAIR  2007    There were no vitals filed for this visit.   Subjective Assessment - 08/08/20 1350     Subjective Pt presents to tx without any shoulder pain. Pt stated greater stretch with pulley when shortening the rope length. Pt stated increased comfort when drive with the progression of the R shoulder.    Pertinent History Pt. states return to surgeon 06/25/20 for check up and possible stable removal. Pt. reports no other surgeries in the last 5 years. However, plans on getting TKAs to both LE once the shoulder rehab has resolved. Pt is  currently not active 2/2 bilat knee pain.  Pt. is retired and lives with husband.    Limitations House hold activities;Standing;Lifting    How long can you sit comfortably? WFL    How long can you stand comfortably? WFL with shoulder. limited by knee    How long can you walk comfortably? WFL    Patient Stated Goals Pt would like to return to independent house work and return to full functional activity.    Currently in Pain? No/denies    Pain Score 0-No pain                Therapeutic Exercise:   Pulleys flexion/abd/scaption 4 mins in each direction with verbal cueing to ensure proper hold at end range without forceful OP. Verbal cueing to facilitate isometric holds at end range for greater muscle activation.    [place]:    *Verbal and contact cueing needed to ensure proper muscle activation and safety of resisted movement*   Standing Row 20# x30,    Bilat shoulder ext 20 #x10   Unilat shoulder add in scapular plane 10 # x 30  with therapist abd block to ensure muscle activation without an increase in pain.     Bilat triceps ext 20# x30 with verbal/contact cueing to ensure proper mechanics and safe form.   Shoulder  flexion/ abd with therapist assist to facilitate proper end-range. Cueing to facilitate isometric hold at endrange with 5 sec eccentric phase. X10 each direction.      Manual Therapy:   Transverse friction massage to distal pectoralis and underlying scar tissue in the ant shoulder with prolonged stretching to promote increase shoulder abd. 12 min. Pt was educated on possible increase in soreness this even and the next day.     *All tx completed was agreeable with surgical protocol.*         PT Short Term Goals - 07/11/20 1520       PT SHORT TERM GOAL #1   Title Pt will begin UE AROM exercises to facilitate independence with light ADLs (Feeding, grooming) per post-surgical protocol.    Baseline Unable to complete AROM. 7/7: Pt is able to feed and wash  face with her RUE    Time 3    Period Weeks    Status Achieved    Target Date 07/09/20      PT SHORT TERM GOAL #2   Title Pt will display proper strength, posture, and confidence with RUE to discontinue sling for increase access to independent ADL function.    Baseline Doned sling during all times of day/night besides HEP and grooming. 7/7 Pt currently does not wear the sling in the home during the day per-protocol.    Time 3    Period Weeks    Status Achieved    Target Date 07/09/20               PT Long Term Goals - 07/18/20 1440       PT LONG TERM GOAL #1   Title Pt will score a 58 on the FOTO to promote objective functional return to PLOF    Baseline 6/14: 28 7/14: 53    Time 12    Period Weeks    Status Partially Met    Target Date 09/10/20      PT LONG TERM GOAL #2   Title Pt. will achieve R shoulder Flex+ABD AROM to 120 deg to promote independence with ADLs.    Baseline 06/18/20 abd: 72 deg./  flex: 65 deg. 07/18/20: AROM: flex 90 abd 80    Time 12    Period Weeks    Status Partially Met    Target Date 09/10/20      PT LONG TERM GOAL #3   Title Pt will improve R shoulder ER AAROM to 30 deg to promote independence with ADLs.    Baseline 11 deg. on 6/14 7/14: 30 deg    Time 12    Period Weeks    Status New    Target Date 09/10/20      PT LONG TERM GOAL #4   Title Pt will achieve R shoulder MMT 4/5 to facilitate workload tolerance similar to ADLs    Baseline N/T 07/18/20 R shoulder flex: 3+/5 abd 3+/5 ER 4-/5 IR 3+/5 Elbow flex+Ext 4-/5.    Time 12    Period Weeks    Status New    Target Date 09/10/20      PT LONG TERM GOAL #5   Title Pt will report <2/10 pain with reaching/lifting tasks to promote functional ability so that she can participate in household tasks.    Baseline 2/10 R shoulder pain currently at rest. 7/14: 1/10 Pt states that is fearful of movement.    Time 12    Period Weeks    Status On-going  Target Date 09/10/20                    Plan - 08/08/20 1512     Clinical Impression Statement Pt displayed good tolerance with tx without increase in pain despite increased intensity of therex with addition of new exercises. Pt continues to display poor R shoulder strength above 90 deg in all planes despite available joint PROM. Future tx shoulder include isometric holds at end range with 5 sec temp eccentric for optimal strength through available range. Pt. will continue to benefit from skilled physical therapy to progress POC to address remaining deficits to facilitate maximum functional capacity for optimal personal health and wellness for ADLs.    Examination-Activity Limitations Hygiene/Grooming;Bathing;Self Feeding    Examination-Participation Restrictions Laundry;Cleaning;Driving;Meal Prep    Stability/Clinical Decision Making Evolving/Moderate complexity    Clinical Decision Making Moderate    Rehab Potential Good    Clinical Impairments Affecting Rehab Potential Negative: chronic nature of pain, comorbidities. Positive: motivated, family support    PT Frequency 2x / week    PT Duration 12 weeks    PT Treatment/Interventions Electrical Stimulation;Fluidtherapy;Moist Heat;Functional mobility training;Therapeutic activities;Therapeutic exercise;Patient/family education;Manual techniques;Scar mobilization;Passive range of motion;Balance training;Neuromuscular re-education;Cryotherapy;DME Instruction    PT Next Visit Plan continue to include isometric holds at end range with 5 sec temp eccentric for optimal strength through available range.    PT Home Exercise Plan See HEP.  Access Code: [email or web address]  updated isometric [email or web address] updated band: [id number]    Consulted and Agree with Plan of Care Patient             Patient will benefit from skilled therapeutic intervention in order to improve the following deficits and impairments:  Decreased endurance, Decreased mobility, Decreased skin integrity, Hypomobility, Impaired  sensation, Improper body mechanics, Increased edema, Decreased scar mobility, Decreased range of motion, Decreased activity tolerance, Decreased strength, Impaired UE functional use, Pain  Visit Diagnosis: Decreased ROM of left shoulder  Acute pain of left shoulder  Decreased strength, endurance, and mobility     Problem List Patient Active Problem List   Diagnosis Date Noted   Pancreatic cyst 11/21/2018   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/21/2018   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   Leucopenia 07/06/2018   B12 deficiency 07/06/2018   Fatty liver disease, nonalcoholic 46/96/2952   Onychomycosis 07/20/2017   Hypertensive disorder 12/03/2016   Intracranial aneurysm 12/03/2016   Family history of brain aneurysm 08/07/2016   Obstructive sleep apnea 06/17/2016   Hyperglycemia, unspecified 01/29/2016   Morbid obesity with BMI of 45.0-49.9, adult ([place]) 04/08/2015   Myalgia 11/15/2014   Costochondritis 04/11/2014   Osteopenia 04/11/2014   Prediabetes 02/15/2014   Hypercholesterolemia 09/26/2013   Degenerative arthritis of left knee 06/02/2013   Pura Spice, PT, DPT # [id number] Fara Olden SPT 08/09/2020, 11:35 AM  [facility] [address]. [place], [place], [address] Phone: [phone]   Fax:  [phone]  Name: Darlene Chen MRN: [id number] Date of Birth: 06-23-1950

## 2020-08-10 ENCOUNTER — Ambulatory Visit
Admission: EM | Admit: 2020-08-10 | Discharge: 2020-08-10 | Disposition: A | Discharge status: Home / Self Care | Payer: Medicare HMO | Attending: Sports Medicine | Admitting: Sports Medicine

## 2020-08-10 ENCOUNTER — Ambulatory Visit (INDEPENDENT_AMBULATORY_CARE_PROVIDER_SITE_OTHER): Payer: Medicare HMO

## 2020-08-10 ENCOUNTER — Encounter: Payer: Self-pay | Admitting: Emergency Medicine

## 2020-08-10 ENCOUNTER — Other Ambulatory Visit: Payer: Self-pay

## 2020-08-10 DIAGNOSIS — M2141 Flat foot [pes planus] (acquired), right foot: Secondary | ICD-10-CM

## 2020-08-10 DIAGNOSIS — M25571 Pain in right ankle and joints of right foot: Secondary | ICD-10-CM

## 2020-08-10 DIAGNOSIS — M2142 Flat foot [pes planus] (acquired), left foot: Secondary | ICD-10-CM

## 2020-08-10 DIAGNOSIS — M67979 Unspecified disorder of synovium and tendon, unspecified ankle and foot: Secondary | ICD-10-CM

## 2020-08-10 MED ORDER — ETODOLAC 500 MG PO TABS: 500.0000 mg | ORAL_TABLET | Freq: Two times a day (BID) | ORAL | 0 refills | Status: DC

## 2020-08-10 NOTE — Discharge Instructions (Addendum)
As we discussed, I would like you to follow-up with the podiatrist that you are already seeing. Please stay away from barefoot flip-flops and sandals.  Use shoes with good arch support. Please see educational handout. You can ice this area several times a day. I did prescribe a prescription strength anti-inflammatory.  No Motrin, Advil, ibuprofen, Naprosyn, or Aleve.  You can take your baby aspirin.  You can also use Tylenol only.  You can use the Voltaren gel in that area as well.

## 2020-08-10 NOTE — ED Provider Notes (Signed)
[facility]    CSN: [id number] Arrival date & time: 08/10/20  8144      History   Chief Complaint Chief Complaint  Patient presents with   Ankle Pain    right    HPI Darlene Chen is a 70 y.o. female.   70 year old female who presents for evaluation of right medial foot and ankle pain.  She has had her symptoms now for little more than a week.  She denies any accidents trauma or falls.  She does have a podiatrist through [facility] and wonders whether or not she should go and see them.  Given that her symptoms have caused her to now limp and she is having some swelling she comes into the urgent care for initial evaluation.  Her primary care needs are met through [facility].  She is retired and does not work outside the home.  She has not sought any medical treatment for this.  No changes in her activity.  No real changes in her footwear although she does admit that due to the summertime she is walking more barefoot and flip-flops and sandals.  No numbness or tingling or any back issues.  All of the symptoms are localized to the posterior aspect of the medial malleolus and around the tibialis posterior tendon.  No red flag signs or symptoms elicited on history.   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   Leucopenia 07/06/2018   B12 deficiency 07/06/2018   Fatty liver disease, nonalcoholic 81/85/6314   Onychomycosis 07/20/2017   Hypertensive disorder 12/03/2016   Intracranial aneurysm 12/03/2016   Family history of brain aneurysm 08/07/2016   Obstructive sleep apnea 06/17/2016   Hyperglycemia, unspecified 01/29/2016   Morbid obesity with BMI of 45.0-49.9, adult ([place])  04/08/2015   Myalgia 11/15/2014   Costochondritis 04/11/2014   Osteopenia 04/11/2014   Prediabetes 02/15/2014   Hypercholesterolemia 09/26/2013   Degenerative arthritis of left knee 06/02/2013    Past Surgical History:  Procedure Laterality Date   arthroscopic knee surgery  on left 2000     BREAST BIOPSY Right 2019   bx/clip-neg   LEFT HEART CATH AND CORONARY ANGIOGRAPHY N/A 02/09/2017   Procedure: LEFT HEART CATH AND CORONARY ANGIOGRAPHY;  Surgeon: Yolonda Kida, MD;  Location: [facility];  Service: Cardiovascular;  Laterality: N/A;   REDUCTION MAMMAPLASTY Bilateral    1990   SHOULDER SURGERY Right    TUBAL LIGATION  9702   UMBILICAL HERNIA REPAIR  2007    OB History   No obstetric history on file.      Home Medications    Prior to Admission medications   Medication Sig Start Date End Date Taking? Authorizing Provider  amLODipine (NORVASC) 10 MG tablet Take 10 mg by mouth daily.   Yes [provider]  aspirin EC 81 MG tablet Take 81 mg by mouth daily.   Yes [provider]  cycloSPORINE (RESTASIS) 0.05 % ophthalmic emulsion Place 1 drop into both eyes 2 (two) times daily as needed.    Yes [provider]  diclofenac sodium (VOLTAREN) 1 % GEL Apply 1 application topically as needed. 10/26/17  Yes [provider]  etodolac (LODINE) 500  MG tablet Take 1 tablet (500 mg total) by mouth 2 (two) times daily. 08/10/20  Yes Verda Cumins, MD  hydrochlorothiazide (HYDRODIURIL) 12.5 MG tablet Take 12.5 mg by mouth daily.   Yes [provider]  LORazepam (ATIVAN) 0.5 MG tablet Take 1 tab 15-20 minutes prior to procedure. 06/27/18  Yes [provider]  metFORMIN (GLUCOPHAGE) 500 MG tablet Take 500 mg by mouth 2 (two) times daily.   Yes [provider]  metoprolol succinate (TOPROL-XL) 25 MG 24 hr tablet Take 25 mg by mouth daily.   Yes [provider]  acetaminophen (TYLENOL) 500 MG tablet Take by  mouth in the morning, at noon, and at bedtime.    [provider]  ammonium lactate (LAC-HYDRIN) 12 % lotion Apply topically. 10/26/19   [provider]  Evolocumab with Infusor ([place]) 420 MG/3.5ML SOCT Inject into the skin. 03/05/20   [provider]  fluocinonide cream (LIDEX) 6.96 % Apply 1 application topically 2 (two) times daily as needed. For eczema 12/28/16   [provider]  tiZANidine (ZANAFLEX) 4 MG tablet Take 4 mg by mouth 3 (three) times daily as needed. 04/11/20   [provider]    Family History Family History  Problem Relation Age of Onset   Breast cancer Sister 59    Social History Social History   Tobacco Use   Smoking status: Never   Smokeless tobacco: Never  Vaping Use   Vaping Use: Never used  Substance Use Topics   Alcohol use: No   Drug use: No     Allergies   Colesevelam, Biaxin [clarithromycin], Niacin, Statins, Sulfa antibiotics, Sulfur dioxide, Tramadol, and Ezetimibe   Review of Systems Review of Systems  Constitutional:  Positive for activity change. Negative for appetite change, chills, diaphoresis, fatigue and fever.  HENT:  Negative for congestion, ear pain, postnasal drip, rhinorrhea, sinus pressure, sinus pain, sneezing and sore throat.   Eyes:  Negative for pain.  Respiratory:  Negative for cough, chest tightness and shortness of breath.   Cardiovascular:  Negative for chest pain and palpitations.  Gastrointestinal:  Negative for abdominal pain, diarrhea, nausea and vomiting.  Genitourinary:  Negative for dysuria.  Musculoskeletal:  Positive for arthralgias and joint swelling. Negative for back pain, myalgias and neck pain.  Skin:  Negative for color change, pallor, rash and wound.  Neurological:  Negative for dizziness, light-headedness, numbness and headaches.  All other systems reviewed and are negative.   Physical Exam Triage Vital Signs ED Triage Vitals  Enc Vitals  Group     BP 08/10/20 1026 139/79     Pulse Rate 08/10/20 1026 75     Resp 08/10/20 1026 15     Temp 08/10/20 1026 97.8 F (36.6 C)     Temp Source 08/10/20 1026 Oral     SpO2 08/10/20 1026 99 %     Weight 08/10/20 1023 223 lb (101.2 kg)     Height 08/10/20 1023 5' (1.524 m)     Head Circumference --      Peak Flow --      Pain Score 08/10/20 1023 10     Pain Loc --      Pain Edu? --      Excl. in [place]? --    No data found.  Updated Vital Signs BP 139/79 (BP Location: Left Arm)   Pulse 75   Temp 97.8 F (36.6 C) (Oral)   Resp 15   Ht 5' (1.524 m)  Wt 101.2 kg   SpO2 99%   BMI 43.55 kg/m   Visual Acuity Right Eye Distance:   Left Eye Distance:   Bilateral Distance:    Right Eye Near:   Left Eye Near:    Bilateral Near:     Physical Exam Vitals and nursing note reviewed.  Constitutional:      General: She is not in acute distress.    Appearance: Normal appearance. She is not ill-appearing, toxic-appearing or diaphoretic.  HENT:     Head: Normocephalic and atraumatic.     Nose: Nose normal.     Mouth/Throat:     Mouth: Mucous membranes are moist.  Eyes:     Conjunctiva/sclera: Conjunctivae normal.     Pupils: Pupils are equal, round, and reactive to light.  Cardiovascular:     Rate and Rhythm: Normal rate and regular rhythm.     Pulses: Normal pulses.     Heart sounds: Normal heart sounds. No murmur heard.   No friction rub. No gallop.  Pulmonary:     Effort: Pulmonary effort is normal.     Breath sounds: Normal breath sounds. No stridor. No wheezing, rhonchi or rales.  Musculoskeletal:        General: Swelling and tenderness present. No deformity or signs of injury.     Cervical back: Normal range of motion and neck supple.     Right ankle: Swelling present. No ecchymosis. Tenderness present. Decreased range of motion.     Right Achilles Tendon: Normal.     Left ankle: Normal.     Left Achilles Tendon: Normal.     Right foot: Decreased range of motion.  Swelling and tenderness present.     Left foot: Normal range of motion. No swelling or tenderness.  Skin:    General: Skin is warm and dry.     Capillary Refill: Capillary refill takes less than 2 seconds.     Coloration: Skin is not jaundiced.     Findings: No bruising, erythema or rash.  Neurological:     General: No focal deficit present.     Mental Status: She is alert and oriented to person, place, and time.     UC Treatments / Results  Labs (all labs ordered are listed, but only abnormal results are displayed) Labs Reviewed - No data to display  EKG   Radiology DG Ankle Complete Right  Result Date: 08/10/2020 CLINICAL DATA:  RIGHT ankle pain and swelling.  No injury. EXAM: RIGHT ANKLE - COMPLETE 3+ VIEW COMPARISON:  None. FINDINGS: There is no acute fracture or subluxation. Soft tissues are unremarkable. Note is made of pes planus. Small calcaneal spur is present. Oval calcifications in the anterior LOWER leg, consistent with venous insufficiency. IMPRESSION: No evidence for acute  abnormality.  Pes planus. Electronically Signed   By: Nolon Nations M.D.   On: 08/10/2020 11:01    Procedures Procedures (including critical care time)  Medications Ordered in UC Medications - No data to display  Initial Impression / Assessment and Plan / UC Course  I have reviewed the triage vital signs and the nursing notes.  Pertinent labs & imaging results that were available during my care of the patient were reviewed by me and considered in my medical decision making (see chart for details).  Clinical impression: 1.  Acute right medial ankle pain with swelling 2.  Tibialis posterior tendinopathy 3.  Pes planus bilaterally more symptomatic on the right.  Treatment plan: 1.  The findings and  treatment plan were discussed in detail with the patient.  Patient was in agreement. 2.  I do agree with follow-up with her podiatrist.  She would benefit from an evaluation and probable  orthotics. 3.  In the interim, I have recommended good shoes with good arch support.  Avoid being barefoot, flip-flops, sandals. 4.  I think ultimately she would benefit from the orthotics but also from physical therapy. 5.  I did prescribe an anti-inflammatory, etodolac.  See if that assists her before she can get into her podiatrist.  No Motrin, Advil, ibuprofen, Naprosyn, Aleve.  She can take a baby aspirin.  She can also take Tylenol if she needs it.  She also has some Voltaren gel that she can try. 6.  Educational handouts provided.  I do want her to ice that are several times a day.  Modify her activity. 7.  She was discharged in stable condition will follow-up here as needed.    Final Clinical Impressions(s) / UC Diagnoses   Final diagnoses:  Acute right ankle pain  Tibialis posterior tendinopathy  Pes planus of both feet     Discharge Instructions      As we discussed, I would like you to follow-up with the podiatrist that you are already seeing. Please stay away from barefoot flip-flops and sandals.  Use shoes with good arch support. Please see educational handout. You can ice this area several times a day. I did prescribe a prescription strength anti-inflammatory.  No Motrin, Advil, ibuprofen, Naprosyn, or Aleve.  You can take your baby aspirin.  You can also use Tylenol only.  You can use the Voltaren gel in that area as well.     ED Prescriptions     Medication Sig Dispense Auth. Provider   etodolac (LODINE) 500 MG tablet Take 1 tablet (500 mg total) by mouth 2 (two) times daily. 60 tablet Verda Cumins, MD      PDMP not reviewed this encounter.   Verda Cumins, MD 08/11/20 1125

## 2020-08-10 NOTE — ED Triage Notes (Signed)
Patient c/o right ankle pain and swelling that started over a week ago.  Patient denies injury or fall.

## 2020-08-13 ENCOUNTER — Other Ambulatory Visit: Payer: Self-pay

## 2020-08-13 ENCOUNTER — Ambulatory Visit: Payer: Medicare HMO | Admitting: Physical Therapy

## 2020-08-13 ENCOUNTER — Encounter: Payer: Self-pay | Admitting: Physical Therapy

## 2020-08-13 DIAGNOSIS — M25612 Stiffness of left shoulder, not elsewhere classified: Secondary | ICD-10-CM | POA: Diagnosis not present

## 2020-08-13 DIAGNOSIS — M25512 Pain in left shoulder: Secondary | ICD-10-CM

## 2020-08-13 DIAGNOSIS — R6889 Other general symptoms and signs: Secondary | ICD-10-CM

## 2020-08-13 NOTE — Therapy (Signed)
[facility] [address] [place], [place], [address] Phone: [phone]   Fax:  [phone]  Physical Therapy Treatment  Patient Details  Name: Darlene Chen MRN: [id number] Date of Birth: February 04, 1950 Referring Provider (PT): Johna Sheriff, MD   Encounter Date: 08/13/2020   PT End of Session - 08/13/20 1624     Visit Number 16    Number of Visits 24    Date for PT Re-Evaluation 09/10/20    Authorization - Visit Number 6    Authorization - Number of Visits 10    PT Start Time 9518    PT Stop Time 8416    PT Time Calculation (min) 48 min    Activity Tolerance Patient tolerated treatment well;No increased pain    Behavior During Therapy WFL for tasks assessed/performed             Past Medical History:  Diagnosis Date   Diabetes mellitus without complication ([place])    High cholesterol    Hypertension    Neutropenia (HCC)     Past Surgical History:  Procedure Laterality Date   arthroscopic knee surgery  on left 2000     BREAST BIOPSY Right 2019   bx/clip-neg   LEFT HEART CATH AND CORONARY ANGIOGRAPHY N/A 02/09/2017   Procedure: LEFT HEART CATH AND CORONARY ANGIOGRAPHY;  Surgeon: Yolonda Kida, MD;  Location: [facility];  Service: Cardiovascular;  Laterality: N/A;   REDUCTION MAMMAPLASTY Bilateral    1990   SHOULDER SURGERY Right    TUBAL LIGATION  6063   UMBILICAL HERNIA REPAIR  2007    There were no vitals filed for this visit.   Subjective Assessment - 08/13/20 1619     Subjective Pt present to tx without any complaints of shoudler pain. Pt states continued improvements with R shoulder use during ADLs. Pt stated increase in R ankle pain and instability. Pt states that she is going to foot specialist for further dx.    Pertinent History Pt. states return to surgeon 06/25/20 for check up and possible stable removal. Pt. reports no other surgeries in the last 5 years. However, plans  on getting TKAs to both LE once the shoulder rehab has resolved. Pt is currently not active 2/2 bilat knee pain.  Pt. is retired and lives with husband.    Limitations House hold activities;Standing;Lifting    How long can you sit comfortably? WFL    How long can you stand comfortably? WFL with shoulder. limited by knee    How long can you walk comfortably? WFL    Patient Stated Goals Pt would like to return to independent house work and return to full functional activity.    Currently in Pain? No/denies    Pain Score 0-No pain    Pain Type Surgical pain    Pain Onset 1 to 4 weeks ago    Pain Frequency Intermittent               Treatment:    Therapeutic Exercise:   Pulleys flexion/abd/scaption 4 mins in each direction with verbal cueing to ensure proper hold at end range without forceful OP. Verbal cueing to facilitate isometric holds at end range for greater muscle activation.   Nautilus:    *Verbal and contact cueing needed to ensure proper muscle activation and safety of resisted movement*   Standing Row 20# x30,   Bilat shoulder ext 20 #x30   Unilat shoulder add in scapular plane 10 #  x 30  with therapist abd block to ensure muscle activation without an increase in pain.  Pt required seated posture due to increase in R ankle pain.    Lat pull down 20#x30. Pt required seated posture due to increase in R ankle pain.    Shoulder scaption with therapist assist to facilitate proper end-range. Cueing to facilitate isometric hold at endrange with 5 sec eccentric phase. X10 each direction.     Supine Chest press 4# DB bilat   Supine ABCs with 4 # DB.    Manual Therapy:   Transverse friction massage to distal pectoralis and underlying scar tissue in the ant shoulder with prolonged stretching to promote increase shoulder abd. 11 min. Pt was educated on possible increase in soreness this even and the next day.     *All tx completed was agreeable with surgical  protocol.*         PT Education - 08/13/20 1622     Education Details Pt ed on continued R shoulder use during ADLs    Person(s) Educated Patient    Methods Explanation    Comprehension Verbalized understanding              PT Short Term Goals - 07/11/20 1520       PT SHORT TERM GOAL #1   Title Pt will begin UE AROM exercises to facilitate independence with light ADLs (Feeding, grooming) per post-surgical protocol.    Baseline Unable to complete AROM. 7/7: Pt is able to feed and wash face with her RUE    Time 3    Period Weeks    Status Achieved    Target Date 07/09/20      PT SHORT TERM GOAL #2   Title Pt will display proper strength, posture, and confidence with RUE to discontinue sling for increase access to independent ADL function.    Baseline Doned sling during all times of day/night besides HEP and grooming. 7/7 Pt currently does not wear the sling in the home during the day per-protocol.    Time 3    Period Weeks    Status Achieved    Target Date 07/09/20               PT Long Term Goals - 07/18/20 1440       PT LONG TERM GOAL #1   Title Pt will score a 58 on the FOTO to promote objective functional return to PLOF    Baseline 6/14: 28 7/14: 53    Time 12    Period Weeks    Status Partially Met    Target Date 09/10/20      PT LONG TERM GOAL #2   Title Pt. will achieve R shoulder Flex+ABD AROM to 120 deg to promote independence with ADLs.    Baseline 06/18/20 abd: 72 deg./  flex: 65 deg. 07/18/20: AROM: flex 90 abd 80    Time 12    Period Weeks    Status Partially Met    Target Date 09/10/20      PT LONG TERM GOAL #3   Title Pt will improve R shoulder ER AAROM to 30 deg to promote independence with ADLs.    Baseline 11 deg. on 6/14 7/14: 30 deg    Time 12    Period Weeks    Status New    Target Date 09/10/20      PT LONG TERM GOAL #4   Title Pt will achieve R shoulder MMT 4/5 to facilitate  workload tolerance similar to ADLs    Baseline  N/T 07/18/20 R shoulder flex: 3+/5 abd 3+/5 ER 4-/5 IR 3+/5 Elbow flex+Ext 4-/5.    Time 12    Period Weeks    Status New    Target Date 09/10/20      PT LONG TERM GOAL #5   Title Pt will report <2/10 pain with reaching/lifting tasks to promote functional ability so that she can participate in household tasks.    Baseline 2/10 R shoulder pain currently at rest. 7/14: 1/10 Pt states that is fearful of movement.    Time 12    Period Weeks    Status On-going    Target Date 09/10/20                   Plan - 08/13/20 1630     Clinical Impression Statement Pt displayed excellent tolerance with shoulder tx. Pt was able to complete tx without increase in pain or soreness. Pt required seated posture with certain exercise due to medial R ankle pain. Pt continues to display R shoulder strength, endurance and ROM deficits, specificity in shoulder ROM above 90 deg Pt. will continue to benefit from skilled physical therapy to progress POC to address remaining deficits to facilitate maximum functional capacity for optimal personal health and wellness for ADLs.    Examination-Activity Limitations Hygiene/Grooming;Bathing;Self Feeding    Examination-Participation Restrictions Laundry;Cleaning;Driving;Meal Prep    Stability/Clinical Decision Making Evolving/Moderate complexity    Clinical Decision Making Moderate    Rehab Potential Good    Clinical Impairments Affecting Rehab Potential Negative: chronic nature of pain, comorbidities. Positive: motivated, family support    PT Frequency 2x / week    PT Duration 12 weeks    PT Treatment/Interventions Electrical Stimulation;Fluidtherapy;Moist Heat;Functional mobility training;Therapeutic activities;Therapeutic exercise;Patient/family education;Manual techniques;Scar mobilization;Passive range of motion;Balance training;Neuromuscular re-education;Cryotherapy;DME Instruction    PT Next Visit Plan continue to include isometric holds at end range with 5  sec temp eccentric for optimal strength through available range.    PT Home Exercise Plan See HEP.  Access Code: [email or web address]  updated isometric [email or web address] updated band: [id number]    Consulted and Agree with Plan of Care Patient             Patient will benefit from skilled therapeutic intervention in order to improve the following deficits and impairments:  Decreased endurance, Decreased mobility, Decreased skin integrity, Hypomobility, Impaired sensation, Improper body mechanics, Increased edema, Decreased scar mobility, Decreased range of motion, Decreased activity tolerance, Decreased strength, Impaired UE functional use, Pain  Visit Diagnosis: Decreased ROM of left shoulder  Acute pain of left shoulder  Decreased strength, endurance, and mobility     Problem List Patient Active Problem List   Diagnosis Date Noted   Pancreatic cyst 11/21/2018   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[place]) 09/21/2018   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   Leucopenia 07/06/2018   B12 deficiency 07/06/2018   Fatty liver disease, nonalcoholic 78/29/5621   Onychomycosis 07/20/2017   Hypertensive disorder 12/03/2016   Intracranial aneurysm 12/03/2016   Family history of brain aneurysm 08/07/2016   Obstructive sleep apnea 06/17/2016   Hyperglycemia, unspecified 01/29/2016   Morbid obesity with BMI of 45.0-49.9, adult ([place]) 04/08/2015   Myalgia 11/15/2014   Costochondritis 04/11/2014   Osteopenia 04/11/2014   Prediabetes 02/15/2014   Hypercholesterolemia 09/26/2013   Degenerative arthritis of left knee 06/02/2013   Pura Spice, PT, DPT # [id number] Fara Olden SPT 08/14/2020, 9:09 AM  [facility] [address] [place], [place], [address] Phone: [phone]   Fax:  [phone]  Name: Marria Mathison MRN: [id number] Date of Birth: 11/30/1950

## 2020-08-15 ENCOUNTER — Encounter: Payer: Medicare HMO | Admitting: Physical Therapy

## 2020-08-20 ENCOUNTER — Other Ambulatory Visit: Payer: Self-pay

## 2020-08-20 ENCOUNTER — Ambulatory Visit: Payer: Medicare HMO | Admitting: Physical Therapy

## 2020-08-20 ENCOUNTER — Encounter: Payer: Self-pay | Admitting: Physical Therapy

## 2020-08-20 DIAGNOSIS — M25612 Stiffness of left shoulder, not elsewhere classified: Secondary | ICD-10-CM

## 2020-08-20 DIAGNOSIS — Z7409 Other reduced mobility: Secondary | ICD-10-CM

## 2020-08-20 DIAGNOSIS — M25512 Pain in left shoulder: Secondary | ICD-10-CM

## 2020-08-20 DIAGNOSIS — R6889 Other general symptoms and signs: Secondary | ICD-10-CM

## 2020-08-20 NOTE — Therapy (Signed)
[facility] [address] [place], [place], [address] Phone: [phone]   Fax:  [phone]  Physical Therapy Treatment  Patient Details  Name: Darlene Chen MRN: [id number] Date of Birth: [birth year] Referring Provider (PT): Johna Sheriff, MD   Encounter Date: 08/20/2020   PT End of Session - 08/20/20 1421     Visit Number 17    Number of Visits 24    Date for PT Re-Evaluation 09/10/20    Authorization - Visit Number 7    Authorization - Number of Visits 10    PT Start Time 8016    PT Stop Time 5537    PT Time Calculation (min) 51 min    Activity Tolerance Patient tolerated treatment well;No increased pain    Behavior During Therapy WFL for tasks assessed/performed             Past Medical History:  Diagnosis Date   Diabetes mellitus without complication ([place])    High cholesterol    Hypertension    Neutropenia (HCC)     Past Surgical History:  Procedure Laterality Date   arthroscopic knee surgery  on left 2000     BREAST BIOPSY Right 2019   bx/clip-neg   LEFT HEART CATH AND CORONARY ANGIOGRAPHY N/A 02/09/2017   Procedure: LEFT HEART CATH AND CORONARY ANGIOGRAPHY;  Surgeon: Yolonda Kida, MD;  Location: [facility];  Service: Cardiovascular;  Laterality: N/A;   REDUCTION MAMMAPLASTY Bilateral    1990   SHOULDER SURGERY Right    TUBAL LIGATION  4827   UMBILICAL HERNIA REPAIR  2007    There were no vitals filed for this visit.   Subjective Assessment - 08/20/20 1410     Subjective Pt present to tx with 0/10 shoulder pain. Pt continues to state that her L ankle is bothering her and that she is seeing a specialist on Friday. Pt states good adherence to HEP without complication.    Pertinent History Pt. states return to surgeon 06/25/20 for check up and possible stable removal. Pt. reports no other surgeries in the last 5 years. However, plans on getting TKAs to both LE once the  shoulder rehab has resolved. Pt is currently not active 2/2 bilat knee pain.  Pt. is retired and lives with husband.    Limitations House hold activities;Standing;Lifting    How long can you sit comfortably? WFL    How long can you stand comfortably? WFL with shoulder. limited by knee    How long can you walk comfortably? WFL    Patient Stated Goals Pt would like to return to independent house work and return to full functional activity.    Currently in Pain? No/denies    Pain Score 0-No pain    Pain Onset 1 to 4 weeks ago               Treatment:      Therapeutic Exercise:   Pulleys flexion/abd/scaption 4 mins in each direction with verbal cueing to ensure proper hold at end range without forceful OP. Verbal cueing to facilitate isometric holds at end range for greater muscle activation.   Nautilus:    *Verbal and contact cueing needed to ensure proper muscle activation and safety of resisted movement*   Seated Row 30# x30,   Bilat shoulder ext 20 #x30    Lat pull down 30#x30. Pt required seated posture due to increase in R ankle pain.    Shoulder flexion/abd with therapist  assist to facilitate proper end-range. Cueing to facilitate isometric hold at endrange with 5 sec eccentric phase. X5 each direction.    Supine Chest press 4# DB bilat    Supine ABCs with 4 # DB.   Supine serratus ant punch with 4# DB and contact cueing to ensure movement generation through the scapula.     Manual Therapy:   Transverse friction massage to distal pectoralis and underlying scar tissue in the ant shoulder with prolonged stretching to promote increase shoulder abd. 11 min. Pt was educated on possible increase in soreness this even and the next day.     *All tx completed was agreeable with surgical protocol.*   Ice at the end of tx 10 mins. No billed      PT Education - 08/20/20 1419     Education Details Pt ed on proper form and technique with therex in seated posture to  reduce pain in the ankle.    Person(s) Educated Patient    Methods Explanation    Comprehension Verbalized understanding              PT Short Term Goals - 07/11/20 1520       PT SHORT TERM GOAL #1   Title Pt will begin UE AROM exercises to facilitate independence with light ADLs (Feeding, grooming) per post-surgical protocol.    Baseline Unable to complete AROM. 7/7: Pt is able to feed and wash face with her RUE    Time 3    Period Weeks    Status Achieved    Target Date 07/09/20      PT SHORT TERM GOAL #2   Title Pt will display proper strength, posture, and confidence with RUE to discontinue sling for increase access to independent ADL function.    Baseline Doned sling during all times of day/night besides HEP and grooming. 7/7 Pt currently does not wear the sling in the home during the day per-protocol.    Time 3    Period Weeks    Status Achieved    Target Date 07/09/20               PT Long Term Goals - 07/18/20 1440       PT LONG TERM GOAL #1   Title Pt will score a 58 on the FOTO to promote objective functional return to PLOF    Baseline 6/14: 28 7/14: 53    Time 12    Period Weeks    Status Partially Met    Target Date 09/10/20      PT LONG TERM GOAL #2   Title Pt. will achieve R shoulder Flex+ABD AROM to 120 deg to promote independence with ADLs.    Baseline 06/18/20 abd: 72 deg./  flex: 65 deg. 07/18/20: AROM: flex 90 abd 80    Time 12    Period Weeks    Status Partially Met    Target Date 09/10/20      PT LONG TERM GOAL #3   Title Pt will improve R shoulder ER AAROM to 30 deg to promote independence with ADLs.    Baseline 11 deg. on 6/14 7/14: 30 deg    Time 12    Period Weeks    Status New    Target Date 09/10/20      PT LONG TERM GOAL #4   Title Pt will achieve R shoulder MMT 4/5 to facilitate workload tolerance similar to ADLs    Baseline N/T 07/18/20 R shoulder flex: 3+/5  abd 3+/5 ER 4-/5 IR 3+/5 Elbow flex+Ext 4-/5.    Time 12     Period Weeks    Status New    Target Date 09/10/20      PT LONG TERM GOAL #5   Title Pt will report <2/10 pain with reaching/lifting tasks to promote functional ability so that she can participate in household tasks.    Baseline 2/10 R shoulder pain currently at rest. 7/14: 1/10 Pt states that is fearful of movement.    Time 12    Period Weeks    Status On-going    Target Date 09/10/20                   Plan - 08/20/20 1422     Clinical Impression Statement Pt tolerated tx very well with progression of UE resistance on the nautilus. Pt needed verbal and contact cueing for serratus ant push and tends to compensate with elbow flex/ext. Pt continues to display UE weakness during shoulder elevation above 90. Pt. will continue to benefit from skilled physical therapy to progress POC to address remaining deficits to facilitate maximum functional capacity for optimal personal health and wellness for ADLs.    Examination-Activity Limitations Hygiene/Grooming;Bathing;Self Feeding    Examination-Participation Restrictions Laundry;Cleaning;Driving;Meal Prep    Stability/Clinical Decision Making Evolving/Moderate complexity    Clinical Decision Making Moderate    Rehab Potential Good    Clinical Impairments Affecting Rehab Potential Negative: chronic nature of pain, comorbidities. Positive: motivated, family support    PT Frequency 2x / week    PT Duration 12 weeks    PT Treatment/Interventions Electrical Stimulation;Fluidtherapy;Moist Heat;Functional mobility training;Therapeutic activities;Therapeutic exercise;Patient/family education;Manual techniques;Scar mobilization;Passive range of motion;Balance training;Neuromuscular re-education;Cryotherapy;DME Instruction    PT Next Visit Plan continue to include isometric holds at end range with 5 sec temp eccentric for optimal strength through available range.    PT Home Exercise Plan See HEP.  Access Code: [email or web address]  updated isometric [email or web address]  updated band: [id number]    Consulted and Agree with Plan of Care Patient             Patient will benefit from skilled therapeutic intervention in order to improve the following deficits and impairments:  Decreased endurance, Decreased mobility, Decreased skin integrity, Hypomobility, Impaired sensation, Improper body mechanics, Increased edema, Decreased scar mobility, Decreased range of motion, Decreased activity tolerance, Decreased strength, Impaired UE functional use, Pain  Visit Diagnosis: Decreased ROM of left shoulder  Acute pain of left shoulder  Decreased strength, endurance, and mobility     Problem List Patient Active Problem List   Diagnosis Date Noted   Pancreatic cyst 11/21/2018   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[place]) 09/21/2018   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   Leucopenia 07/06/2018   B12 deficiency 07/06/2018   Fatty liver disease, nonalcoholic 50/53/9767   Onychomycosis 07/20/2017   Hypertensive disorder 12/03/2016   Intracranial aneurysm 12/03/2016   Family history of brain aneurysm 08/07/2016   Obstructive sleep apnea 06/17/2016   Hyperglycemia, unspecified 01/29/2016   Morbid obesity with BMI of 45.0-49.9, adult ([place]) 04/08/2015   Myalgia 11/15/2014   Costochondritis 04/11/2014   Osteopenia 04/11/2014   Prediabetes 02/15/2014   Hypercholesterolemia 09/26/2013   Degenerative arthritis of left knee 06/02/2013   Pura Spice, PT, DPT # [id number] Fara Olden, SPT 08/20/2020, 3:11 PM  [facility] [address]. [place], [place], [address] Phone: [phone]   Fax:  [phone]  Name: Darlene Chen MRN: [id number] Date of Birth: [birth year]

## 2020-08-22 ENCOUNTER — Other Ambulatory Visit: Payer: Self-pay

## 2020-08-22 ENCOUNTER — Encounter: Payer: Self-pay | Admitting: Physical Therapy

## 2020-08-22 ENCOUNTER — Ambulatory Visit: Payer: Medicare HMO | Admitting: Physical Therapy

## 2020-08-22 DIAGNOSIS — M25612 Stiffness of left shoulder, not elsewhere classified: Secondary | ICD-10-CM

## 2020-08-22 DIAGNOSIS — M25512 Pain in left shoulder: Secondary | ICD-10-CM

## 2020-08-22 DIAGNOSIS — R531 Weakness: Secondary | ICD-10-CM

## 2020-08-22 DIAGNOSIS — Z7409 Other reduced mobility: Secondary | ICD-10-CM

## 2020-08-22 NOTE — Therapy (Signed)
[facility] [address] [place], [place], [address] Phone: [phone]   Fax:  [phone]  Physical Therapy Treatment  Patient Details  Name: Darlene Chen MRN: [id number] Date of Birth: [birth year] Referring Provider (PT): Johna Sheriff, MD   Encounter Date: 08/22/2020   PT End of Session - 08/22/20 1549     Visit Number 18    Number of Visits 24    Date for PT Re-Evaluation 09/10/20    Authorization - Visit Number 8    Authorization - Number of Visits 10    PT Start Time 5859    PT Stop Time 2924    PT Time Calculation (min) 39 min    Activity Tolerance Patient tolerated treatment well;No increased pain    Behavior During Therapy WFL for tasks assessed/performed             Past Medical History:  Diagnosis Date   Diabetes mellitus without complication ([place])    High cholesterol    Hypertension    Neutropenia (HCC)     Past Surgical History:  Procedure Laterality Date   arthroscopic knee surgery  on left 2000     BREAST BIOPSY Right 2019   bx/clip-neg   LEFT HEART CATH AND CORONARY ANGIOGRAPHY N/A 02/09/2017   Procedure: LEFT HEART CATH AND CORONARY ANGIOGRAPHY;  Surgeon: Yolonda Kida, MD;  Location: [facility];  Service: Cardiovascular;  Laterality: N/A;   REDUCTION MAMMAPLASTY Bilateral    1990   SHOULDER SURGERY Right    TUBAL LIGATION  4628   UMBILICAL HERNIA REPAIR  2007    There were no vitals filed for this visit.   Subjective Assessment - 08/22/20 1356     Subjective Pt present to tx with 0/10 Shoulder pain. Pt states that she is seeing a foot specialist this afternoon and will give a update next tx. Pt states that she is able to comb her hair, cook, and clean. Pt is currently unable to lift heavy pots of water.    Pertinent History Pt. states return to surgeon 06/25/20 for check up and possible stable removal. Pt. reports no other surgeries in the last 5 years.  However, plans on getting TKAs to both LE once the shoulder rehab has resolved. Pt is currently not active 2/2 bilat knee pain.  Pt. is retired and lives with husband.    Limitations House hold activities;Standing;Lifting    How long can you sit comfortably? WFL    How long can you stand comfortably? WFL with shoulder. limited by knee    How long can you walk comfortably? WFL    Patient Stated Goals Pt would like to return to independent house work and return to full functional activity.    Currently in Pain? No/denies    Pain Score 0-No pain    Pain Onset 1 to 4 weeks ago              Treatment:      Therapeutic Exercise:   Pulleys flexion/scaption 4 mins in each direction with verbal cueing to ensure proper hold at end range without forceful OP. Verbal cueing to facilitate isometric holds at end range for greater muscle activation.   Seated Nerf basketball shooting. 3 positions with 1 ft distance added every time. Starting position 2 feet from seated position. Pt stated no increase in pain after activity  Seated Tic-Tac-To x5 with increased elevation each round. No increase in shoulder pain  Rhythmic stabilization in supine with 90 deg of shoulder flexion. 30 sec bouts x 4      Manual Therapy:   Transverse friction massage to distal pectoralis and underlying scar tissue in the ant shoulder with prolonged stretching to promote increase shoulder abd. 11 min. Pt was educated on possible increase in soreness this even and the next day.     *All tx completed was agreeable with surgical protocol.*     Ice at the end of tx 10 mins. No billed           PT Short Term Goals - 07/11/20 1520       PT SHORT TERM GOAL #1   Title Pt will begin UE AROM exercises to facilitate independence with light ADLs (Feeding, grooming) per post-surgical protocol.    Baseline Unable to complete AROM. 7/7: Pt is able to feed and wash face with her RUE    Time 3    Period Weeks     Status Achieved    Target Date 07/09/20      PT SHORT TERM GOAL #2   Title Pt will display proper strength, posture, and confidence with RUE to discontinue sling for increase access to independent ADL function.    Baseline Doned sling during all times of day/night besides HEP and grooming. 7/7 Pt currently does not wear the sling in the home during the day per-protocol.    Time 3    Period Weeks    Status Achieved    Target Date 07/09/20               PT Long Term Goals - 07/18/20 1440       PT LONG TERM GOAL #1   Title Pt will score a 58 on the FOTO to promote objective functional return to PLOF    Baseline 6/14: 28 7/14: 53    Time 12    Period Weeks    Status Partially Met    Target Date 09/10/20      PT LONG TERM GOAL #2   Title Pt. will achieve R shoulder Flex+ABD AROM to 120 deg to promote independence with ADLs.    Baseline 06/18/20 abd: 72 deg./  flex: 65 deg. 07/18/20: AROM: flex 90 abd 80    Time 12    Period Weeks    Status Partially Met    Target Date 09/10/20      PT LONG TERM GOAL #3   Title Pt will improve R shoulder ER AAROM to 30 deg to promote independence with ADLs.    Baseline 11 deg. on 6/14 7/14: 30 deg    Time 12    Period Weeks    Status New    Target Date 09/10/20      PT LONG TERM GOAL #4   Title Pt will achieve R shoulder MMT 4/5 to facilitate workload tolerance similar to ADLs    Baseline N/T 07/18/20 R shoulder flex: 3+/5 abd 3+/5 ER 4-/5 IR 3+/5 Elbow flex+Ext 4-/5.    Time 12    Period Weeks    Status New    Target Date 09/10/20      PT LONG TERM GOAL #5   Title Pt will report <2/10 pain with reaching/lifting tasks to promote functional ability so that she can participate in household tasks.    Baseline 2/10 R shoulder pain currently at rest. 7/14: 1/10 Pt states that is fearful of movement.    Time 12    Period Weeks  Status On-going    Target Date 09/10/20                   Plan - 08/22/20 1550     Clinical  Impression Statement Tx was focused on dynamic stability and light power production of the R shoulder. Pt tolerated all novice therex without increase in pain and stated enjoying a change in treatment style. Pt is very limited in standing activity 2/2 to ankle pain. Pt continues to display weakness during shoulder ROM above 90 deg of elevation. Pt. will continue to benefit from skilled physical therapy to progress POC to address remaining deficits to facilitate maximum functional capacity for optimal personal health and wellness for ADLs.    Examination-Activity Limitations Hygiene/Grooming;Bathing;Self Feeding    Examination-Participation Restrictions Laundry;Cleaning;Driving;Meal Prep    Stability/Clinical Decision Making Evolving/Moderate complexity    Clinical Decision Making Moderate    Rehab Potential Good    Clinical Impairments Affecting Rehab Potential Negative: chronic nature of pain, comorbidities. Positive: motivated, family support    PT Frequency 2x / week    PT Duration 12 weeks    PT Treatment/Interventions Electrical Stimulation;Fluidtherapy;Moist Heat;Functional mobility training;Therapeutic activities;Therapeutic exercise;Patient/family education;Manual techniques;Scar mobilization;Passive range of motion;Balance training;Neuromuscular re-education;Cryotherapy;DME Instruction    PT Next Visit Plan Continue strengthening of the R shoulder.    PT Home Exercise Plan See HEP.  Access Code: [email or web address]  updated isometric [email or web address] updated band: [id number]    Consulted and Agree with Plan of Care Patient             Patient will benefit from skilled therapeutic intervention in order to improve the following deficits and impairments:  Decreased endurance, Decreased mobility, Decreased skin integrity, Hypomobility, Impaired sensation, Improper body mechanics, Increased edema, Decreased scar mobility, Decreased range of motion, Decreased activity tolerance, Decreased strength, Impaired UE  functional use, Pain  Visit Diagnosis: Decreased ROM of left shoulder  Acute pain of left shoulder  Decreased strength, endurance, and mobility     Problem List Patient Active Problem List   Diagnosis Date Noted   Pancreatic cyst 11/21/2018   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[place]) 09/21/2018   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   Leucopenia 07/06/2018   B12 deficiency 07/06/2018   Fatty liver disease, nonalcoholic 31/54/0086   Onychomycosis 07/20/2017   Hypertensive disorder 12/03/2016   Intracranial aneurysm 12/03/2016   Family history of brain aneurysm 08/07/2016   Obstructive sleep apnea 06/17/2016   Hyperglycemia, unspecified 01/29/2016   Morbid obesity with BMI of 45.0-49.9, adult ([place]) 04/08/2015   Myalgia 11/15/2014   Costochondritis 04/11/2014   Osteopenia 04/11/2014   Prediabetes 02/15/2014   Hypercholesterolemia 09/26/2013   Degenerative arthritis of left knee 06/02/2013   Pura Spice, PT, DPT # [id number] Fara Olden SPT 08/23/2020, 10:52 AM  [facility] [address] [place], [place], [address] Phone: [phone]   Fax:  [phone]  Name: Darlene Chen MRN: [id number] Date of Birth: Feb 01, 1950

## 2020-08-27 ENCOUNTER — Ambulatory Visit: Payer: Medicare HMO | Admitting: Physical Therapy

## 2020-08-27 ENCOUNTER — Encounter: Payer: Self-pay | Admitting: Physical Therapy

## 2020-08-27 ENCOUNTER — Other Ambulatory Visit: Payer: Self-pay

## 2020-08-27 DIAGNOSIS — R531 Weakness: Secondary | ICD-10-CM

## 2020-08-27 DIAGNOSIS — M25512 Pain in left shoulder: Secondary | ICD-10-CM

## 2020-08-27 DIAGNOSIS — R6889 Other general symptoms and signs: Secondary | ICD-10-CM

## 2020-08-27 DIAGNOSIS — M25612 Stiffness of left shoulder, not elsewhere classified: Secondary | ICD-10-CM | POA: Diagnosis not present

## 2020-08-27 NOTE — Therapy (Signed)
[facility] [address] [place], [place], [address] Phone: [phone]   Fax:  [phone]  Physical Therapy Treatment  Patient Details  Name: Darlene Chen MRN: [id number] Date of Birth: January 01, 1951 Referring Provider (PT): Johna Sheriff, MD   Encounter Date: 08/27/2020   PT End of Session - 08/27/20 1353     Visit Number 19    Number of Visits 24    Date for PT Re-Evaluation 09/10/20    Authorization - Visit Number 9    Authorization - Number of Visits 10    PT Start Time 1103    PT Stop Time 1594    PT Time Calculation (min) 48 min    Activity Tolerance Patient tolerated treatment well;No increased pain    Behavior During Therapy WFL for tasks assessed/performed             Past Medical History:  Diagnosis Date   Diabetes mellitus without complication ([place])    High cholesterol    Hypertension    Neutropenia (HCC)     Past Surgical History:  Procedure Laterality Date   arthroscopic knee surgery  on left 2000     BREAST BIOPSY Right 2019   bx/clip-neg   LEFT HEART CATH AND CORONARY ANGIOGRAPHY N/A 02/09/2017   Procedure: LEFT HEART CATH AND CORONARY ANGIOGRAPHY;  Surgeon: Yolonda Kida, MD;  Location: [facility];  Service: Cardiovascular;  Laterality: N/A;   REDUCTION MAMMAPLASTY Bilateral    1990   SHOULDER SURGERY Right    TUBAL LIGATION  5859   UMBILICAL HERNIA REPAIR  2007    There were no vitals filed for this visit.   Subjective Assessment - 08/27/20 1347     Subjective Pt presents to tx with 0/10 shoulder pain. Pt states that she has noticed a increase in scar sensitivity in the last week. Pt stated amb into the clinic with a laced ankle brace with a MRI in 3 weeks.    Pertinent History Pt. states return to surgeon 06/25/20 for check up and possible stable removal. Pt. reports no other surgeries in the last 5 years. However, plans on getting TKAs to both LE once the  shoulder rehab has resolved. Pt is currently not active 2/2 bilat knee pain.  Pt. is retired and lives with husband.    Limitations House hold activities;Standing;Lifting    How long can you sit comfortably? WFL    How long can you stand comfortably? WFL with shoulder. limited by knee    How long can you walk comfortably? WFL    Patient Stated Goals Pt would like to return to independent house work and return to full functional activity.    Currently in Pain? No/denies    Pain Score 0-No pain    Pain Orientation Right    Pain Onset 1 to 4 weeks ago              Treatment:      Therapeutic Exercise:   Pulleys flexion/abd x 10 in each direction with verbal cueing to facilitate 10 sec isometric holds at end range for greater muscle activation.   Seated Nerf basketball shooting.  positioned 57f from seat. Verbal cueing to facilitate increased dynamic shoulder flexion to increased    Partial supine unilat skull crusher 4# 3x10 with verbal and contact cueing to ensure correct tricep loading.   Seated reciprocal bicep curls 4# x30 each leg.   Nautilus exercise done in seated posture to  offload the ankle. Verbal and contact cueing to ensure proper mechanics for optimal tissue loading that is safe.  1) lat pull down 20# x30 2) scapular retraction/row 30# x30      Manual Therapy:   Prolonged stretching to promote increased shoulder abd. 12 min. Pt was educated on possible increase in soreness this even and the next day.     *All tx completed was agreeable with surgical protocol.*     Ice at the end of tx 10 mins. No billed          PT Education - 08/27/20 1351     Education provided Yes    Education Details Pt was educated about scar massage with wash cloth or other textured objects to desensitize the incision.    Person(s) Educated Patient    Methods Explanation    Comprehension Verbalized understanding              PT Short Term Goals - 07/11/20 1520        PT SHORT TERM GOAL #1   Title Pt will begin UE AROM exercises to facilitate independence with light ADLs (Feeding, grooming) per post-surgical protocol.    Baseline Unable to complete AROM. 7/7: Pt is able to feed and wash face with her RUE    Time 3    Period Weeks    Status Achieved    Target Date 07/09/20      PT SHORT TERM GOAL #2   Title Pt will display proper strength, posture, and confidence with RUE to discontinue sling for increase access to independent ADL function.    Baseline Doned sling during all times of day/night besides HEP and grooming. 7/7 Pt currently does not wear the sling in the home during the day per-protocol.    Time 3    Period Weeks    Status Achieved    Target Date 07/09/20               PT Long Term Goals - 07/18/20 1440       PT LONG TERM GOAL #1   Title Pt will score a 58 on the FOTO to promote objective functional return to PLOF    Baseline 6/14: 28 7/14: 53    Time 12    Period Weeks    Status Partially Met    Target Date 09/10/20      PT LONG TERM GOAL #2   Title Pt. will achieve R shoulder Flex+ABD AROM to 120 deg to promote independence with ADLs.    Baseline 06/18/20 abd: 72 deg./  flex: 65 deg. 07/18/20: AROM: flex 90 abd 80    Time 12    Period Weeks    Status Partially Met    Target Date 09/10/20      PT LONG TERM GOAL #3   Title Pt will improve R shoulder ER AAROM to 30 deg to promote independence with ADLs.    Baseline 11 deg. on 6/14 7/14: 30 deg    Time 12    Period Weeks    Status New    Target Date 09/10/20      PT LONG TERM GOAL #4   Title Pt will achieve R shoulder MMT 4/5 to facilitate workload tolerance similar to ADLs    Baseline N/T 07/18/20 R shoulder flex: 3+/5 abd 3+/5 ER 4-/5 IR 3+/5 Elbow flex+Ext 4-/5.    Time 12    Period Weeks    Status New    Target Date 09/10/20  PT LONG TERM GOAL #5   Title Pt will report <2/10 pain with reaching/lifting tasks to promote functional ability so that she can  participate in household tasks.    Baseline 2/10 R shoulder pain currently at rest. 7/14: 1/10 Pt states that is fearful of movement.    Time 12    Period Weeks    Status On-going    Target Date 09/10/20                   Plan - 08/27/20 1400     Clinical Impression Statement Today's tx was focused on increased bicep/tricep loading to facilitate increased shoulder strength during elevation over 90 deg. Pt continues to display R shoulder strength, endurance, ROM deficits to resulted in decreased access to independent ADLs. Pt. will continue to benefit from skilled physical therapy to progress POC to address remaining deficits to facilitate maximum functional capacity for optimal personal health and wellness for ADLs.    Examination-Activity Limitations Hygiene/Grooming;Bathing;Self Feeding    Examination-Participation Restrictions Laundry;Cleaning;Driving;Meal Prep    Stability/Clinical Decision Making Evolving/Moderate complexity    Clinical Decision Making Moderate    Rehab Potential Good    Clinical Impairments Affecting Rehab Potential Negative: chronic nature of pain, comorbidities. Positive: motivated, family support    PT Frequency 2x / week    PT Duration 12 weeks    PT Treatment/Interventions Electrical Stimulation;Fluidtherapy;Moist Heat;Functional mobility training;Therapeutic activities;Therapeutic exercise;Patient/family education;Manual techniques;Scar mobilization;Passive range of motion;Balance training;Neuromuscular re-education;Cryotherapy;DME Instruction    PT Next Visit Plan Continue strengthening of the R shoulder.  Reassess scar sensitivity.    PT Home Exercise Plan See HEP.  Access Code: [email or web address]  updated isometric [email or web address] updated band: [id number]    Consulted and Agree with Plan of Care Patient             Patient will benefit from skilled therapeutic intervention in order to improve the following deficits and impairments:  Decreased endurance, Decreased  mobility, Decreased skin integrity, Hypomobility, Impaired sensation, Improper body mechanics, Increased edema, Decreased scar mobility, Decreased range of motion, Decreased activity tolerance, Decreased strength, Impaired UE functional use, Pain  Visit Diagnosis: Decreased ROM of left shoulder  Acute pain of left shoulder  Decreased strength, endurance, and mobility     Problem List Patient Active Problem List   Diagnosis Date Noted   Pancreatic cyst 11/21/2018   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[place]) 09/21/2018   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   Leucopenia 07/06/2018   B12 deficiency 07/06/2018   Fatty liver disease, nonalcoholic 54/09/8117   Onychomycosis 07/20/2017   Hypertensive disorder 12/03/2016   Intracranial aneurysm 12/03/2016   Family history of brain aneurysm 08/07/2016   Obstructive sleep apnea 06/17/2016   Hyperglycemia, unspecified 01/29/2016   Morbid obesity with BMI of 45.0-49.9, adult ([place]) 04/08/2015   Myalgia 11/15/2014   Costochondritis 04/11/2014   Osteopenia 04/11/2014   Prediabetes 02/15/2014   Hypercholesterolemia 09/26/2013   Degenerative arthritis of left knee 06/02/2013   Pura Spice, PT, DPT # [id number] Fara Olden SPT 08/27/2020, 4:33 PM  [facility] [address] [place], [place], [address] Phone: [phone]   Fax:  [phone]  Name: Darlene Chen MRN: [id number] Date of Birth: 1950-04-21

## 2020-08-29 ENCOUNTER — Ambulatory Visit: Payer: Medicare HMO | Admitting: Physical Therapy

## 2020-08-29 ENCOUNTER — Other Ambulatory Visit: Payer: Self-pay

## 2020-08-29 ENCOUNTER — Encounter: Payer: Self-pay | Admitting: Physical Therapy

## 2020-08-29 DIAGNOSIS — R531 Weakness: Secondary | ICD-10-CM

## 2020-08-29 DIAGNOSIS — M25612 Stiffness of left shoulder, not elsewhere classified: Secondary | ICD-10-CM | POA: Diagnosis not present

## 2020-08-29 DIAGNOSIS — M25512 Pain in left shoulder: Secondary | ICD-10-CM

## 2020-08-29 DIAGNOSIS — Z7409 Other reduced mobility: Secondary | ICD-10-CM

## 2020-08-29 NOTE — Therapy (Signed)
[facility] [address] [place], [place], [address] Phone: [phone]   Fax:  [phone]  Physical Therapy Treatment and Progress Note 07/23/2020-08/29/2020  Patient Details  Name: Darlene Chen MRN: [id number] Date of Birth: Feb 19, 1950 Referring Provider (PT): Johna Sheriff, MD   Encounter Date: 08/29/2020   PT End of Session - 08/29/20 1350     Visit Number 20    Number of Visits 24    Date for PT Re-Evaluation 09/10/20    Authorization - Visit Number 10    Authorization - Number of Visits 10    PT Start Time 3762    PT Stop Time 8315    PT Time Calculation (min) 46 min    Activity Tolerance Patient tolerated treatment well;No increased pain    Behavior During Therapy WFL for tasks assessed/performed             Past Medical History:  Diagnosis Date   Diabetes mellitus without complication ([place])    High cholesterol    Hypertension    Neutropenia (HCC)     Past Surgical History:  Procedure Laterality Date   arthroscopic knee surgery  on left 2000     BREAST BIOPSY Right 2019   bx/clip-neg   LEFT HEART CATH AND CORONARY ANGIOGRAPHY N/A 02/09/2017   Procedure: LEFT HEART CATH AND CORONARY ANGIOGRAPHY;  Surgeon: Yolonda Kida, MD;  Location: [facility];  Service: Cardiovascular;  Laterality: N/A;   REDUCTION MAMMAPLASTY Bilateral    1990   SHOULDER SURGERY Right    TUBAL LIGATION  1761   UMBILICAL HERNIA REPAIR  2007    There were no vitals filed for this visit.   Subjective Assessment - 08/29/20 1348     Subjective Pt states no pain at the start of tx. Pt stated that she has started to take a steroid tapper for her ankle. Pt reports 75-80% improvement from the onset on surgery. PT c/c at this time is not being able to lift heavy objects.    Pertinent History Pt. states return to surgeon 06/25/20 for check up and possible stable removal. Pt. reports no other surgeries in the  last 5 years. However, plans on getting TKAs to both LE once the shoulder rehab has resolved. Pt is currently not active 2/2 bilat knee pain.  Pt. is retired and lives with husband.    Limitations House hold activities;Standing;Lifting    How long can you sit comfortably? WFL    How long can you stand comfortably? WFL with shoulder. limited by knee    How long can you walk comfortably? WFL    Patient Stated Goals Pt would like to return to independent house work and return to full functional activity.    Currently in Pain? No/denies    Pain Score 0-No pain    Pain Onset 1 to 4 weeks ago            Reassessment:   FOTO: 58 with a predicted improvement value of 53  AROM: flex: 110 ext 105 deg 8/25: 62 deg   MMT: N/T 07/18/20 R shoulder flex: 3+/5 abd 3+/5 ER 4-/5 IR 3+/5 Elbow flex+Ext 4-/5. 8/25: R shoulder flex: -4/5 ABD 3+/5, Elbow flexion 4+/5, Elbow ext 4/5.   Pain and fear of movement when reaching: Pt states worse pain when reaching is 01/10/08, and is no longer fearful of movemnet. However, pt states continued difficulty with overhead task.    Treatment:  Therapeutic Exercise:   Pulleys flexion/abd x 20 in each direction with verbal cueing to facilitate 10 sec isometric holds at end range for greater muscle activation.     Seated reciprocal bicep curls 4# x30 each arm.  Seated Shoulder abd with 1# each arm x30.    Nautilus exercise done in seated posture to offload the ankle. Verbal and contact cueing to ensure proper mechanics for optimal tissue loading that is safe.  1) lat pull down 30# x30 2) scapular retraction/row 40# x30        *All tx completed was agreeable with surgical protocol.*     Ice at the end of tx 10 mins. No billed         PT Education - 08/29/20 1630     Education Details Pt was educated on current POC and pregression form IE and previous progress note.    Person(s) Educated Patient    Methods Explanation;Verbal cues     Comprehension Verbalized understanding              PT Short Term Goals - 07/11/20 1520       PT SHORT TERM GOAL #1   Title Pt will begin UE AROM exercises to facilitate independence with light ADLs (Feeding, grooming) per post-surgical protocol.    Baseline Unable to complete AROM. 7/7: Pt is able to feed and wash face with her RUE    Time 3    Period Weeks    Status Achieved    Target Date 07/09/20      PT SHORT TERM GOAL #2   Title Pt will display proper strength, posture, and confidence with RUE to discontinue sling for increase access to independent ADL function.    Baseline Doned sling during all times of day/night besides HEP and grooming. 7/7 Pt currently does not wear the sling in the home during the day per-protocol.    Time 3    Period Weeks    Status Achieved    Target Date 07/09/20               PT Long Term Goals - 08/29/20 1351       PT LONG TERM GOAL #1   Title Pt will score a 58 on the FOTO to promote objective functional return to PLOF    Baseline 6/14: 28 7/14: 53 8/25:53    Time 12    Period Weeks    Status Partially Met    Target Date 09/10/20      PT LONG TERM GOAL #2   Title Pt. will achieve R shoulder Flex+ABD AROM to 120 deg to promote independence with ADLs.    Baseline 06/18/20 abd: 72 deg./  flex: 65 deg. 07/18/20: AROM: flex 90 abd 80 8/25: AROM flex: 110 ext 105 deg    Time 12    Period Weeks    Status Partially Met    Target Date 09/10/20      PT LONG TERM GOAL #3   Title Pt will improve R shoulder ER AAROM to 30 deg to promote independence with ADLs.    Baseline 11 deg. on 6/14 7/14: 30 deg 8/25: AROM 62 deg    Time 12    Period Weeks    Status Achieved    Target Date 09/10/20      PT LONG TERM GOAL #4   Title Pt will achieve R shoulder MMT 4/5 to facilitate workload tolerance similar to ADLs    Baseline N/T 07/18/20 R shoulder  flex: 3+/5 abd 3+/5 ER 4-/5 IR 3+/5 Elbow flex+Ext 4-/5. 8/25: R shoulder flex: -4/5 ABD 3+/5,  Elbow flexion 4+/5, Elbow ext 4/5.    Time 12    Period Weeks    Status Partially Met    Target Date 09/10/20      PT LONG TERM GOAL #5   Title Pt will report <2/10 pain with reaching/lifting tasks to promote functional ability so that she can participate in household tasks.    Baseline 2/10 R shoulder pain currently at rest. 7/14: 1/10 Pt states that is fearful of movement. 8/25: Pt states worse pain when reaching is 01/10/08, and is no longer fearful of movemnet.    Time 12    Period Weeks    Status Achieved    Target Date 09/10/20                   Plan - 08/29/20 1639     Clinical Impression Statement Pt was reassess on this date. Progress note assessment yielded the following values: FOTO: 58 with a predicted improvement value of 53. AROM: flex: 110 ext 105 deg 8/25: 62 deg. MMT: N/T 07/18/20 R shoulder flex: 3+/5 abd 3+/5 ER 4-/5 IR 3+/5 Elbow flex+Ext 4-/5. 8/25: R shoulder flex: -4/5 ABD 3+/5, Elbow flexion 4+/5, Elbow ext 4/5. Pain and fear of movement when reaching: Pt states worse pain when reaching is 01/10/08, and is no longer fearful of movemnet. However, pt states continued difficulty with overhead task. Pt. will continue to benefit from skilled physical therapy to progress POC to address remaining deficits to facilitate maximum functional capacity for optimal personal health and wellness for ADLs.    Examination-Activity Limitations Hygiene/Grooming;Bathing;Self Feeding    Examination-Participation Restrictions Laundry;Cleaning;Driving;Meal Prep    Stability/Clinical Decision Making Evolving/Moderate complexity    Clinical Decision Making Moderate    Rehab Potential Good    Clinical Impairments Affecting Rehab Potential Negative: chronic nature of pain, comorbidities. Positive: motivated, family support    PT Frequency 2x / week    PT Duration 12 weeks    PT Treatment/Interventions Electrical Stimulation;Fluidtherapy;Moist Heat;Functional mobility training;Therapeutic  activities;Therapeutic exercise;Patient/family education;Manual techniques;Scar mobilization;Passive range of motion;Balance training;Neuromuscular re-education;Cryotherapy;DME Instruction    PT Next Visit Plan Continue strengthening of the R shoulder.  Reassess scar sensitivity.    PT Home Exercise Plan See HEP.  Access Code: [email or web address]  updated isometric [email or web address] updated band: [id number]    Consulted and Agree with Plan of Care Patient             Patient will benefit from skilled therapeutic intervention in order to improve the following deficits and impairments:  Decreased endurance, Decreased mobility, Decreased skin integrity, Hypomobility, Impaired sensation, Improper body mechanics, Increased edema, Decreased scar mobility, Decreased range of motion, Decreased activity tolerance, Decreased strength, Impaired UE functional use, Pain  Visit Diagnosis: Decreased ROM of left shoulder  Decreased strength, endurance, and mobility  Acute pain of left shoulder     Problem List Patient Active Problem List   Diagnosis Date Noted   Pancreatic cyst 11/21/2018   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[place]) 09/21/2018   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   Leucopenia 07/06/2018   B12 deficiency 07/06/2018   Fatty liver disease, nonalcoholic 41/96/2229   Onychomycosis 07/20/2017   Hypertensive disorder 12/03/2016   Intracranial aneurysm 12/03/2016   Family history of brain aneurysm 08/07/2016   Obstructive sleep apnea 06/17/2016   Hyperglycemia, unspecified 01/29/2016   Morbid obesity with BMI of 45.0-49.9, adult ([place]) 04/08/2015   Myalgia 11/15/2014  Costochondritis 04/11/2014   Osteopenia 04/11/2014   Prediabetes 02/15/2014   Hypercholesterolemia 09/26/2013   Degenerative arthritis of left knee 06/02/2013   Pura Spice, PT, DPT # [id number] Fara Olden SPT 08/29/2020, 5:24 PM  [facility] [address] [place], [place], [address] Phone:  [phone]   Fax:  [phone]  Name: Darlene Chen MRN: [id number] Date of Birth: Nov 19, 1950

## 2020-09-03 ENCOUNTER — Ambulatory Visit: Payer: Medicare HMO | Admitting: Physical Therapy

## 2020-09-03 ENCOUNTER — Other Ambulatory Visit: Payer: Self-pay

## 2020-09-03 DIAGNOSIS — R6889 Other general symptoms and signs: Secondary | ICD-10-CM

## 2020-09-03 DIAGNOSIS — M25612 Stiffness of left shoulder, not elsewhere classified: Secondary | ICD-10-CM

## 2020-09-03 DIAGNOSIS — R531 Weakness: Secondary | ICD-10-CM

## 2020-09-03 DIAGNOSIS — M25512 Pain in left shoulder: Secondary | ICD-10-CM

## 2020-09-03 NOTE — Therapy (Signed)
[facility] [address] [place], [place], [address] Phone: [phone]   Fax:  [phone]  Physical Therapy Treatment  Patient Details  Name: Darlene Chen MRN: [id number] Date of Birth: 1950-04-20 Referring Provider (PT): Johna Sheriff, MD   Encounter Date: 09/03/2020   PT End of Session - 09/03/20 1450     Visit Number 21    Number of Visits 24    Date for PT Re-Evaluation 09/10/20    Authorization - Visit Number 1    Authorization - Number of Visits 10    PT Start Time 5929    PT Stop Time 2446    PT Time Calculation (min) 52 min    Activity Tolerance Patient tolerated treatment well;No increased pain    Behavior During Therapy WFL for tasks assessed/performed             Past Medical History:  Diagnosis Date   Diabetes mellitus without complication ([place])    High cholesterol    Hypertension    Neutropenia (HCC)     Past Surgical History:  Procedure Laterality Date   arthroscopic knee surgery  on left 2000     BREAST BIOPSY Right 2019   bx/clip-neg   LEFT HEART CATH AND CORONARY ANGIOGRAPHY N/A 02/09/2017   Procedure: LEFT HEART CATH AND CORONARY ANGIOGRAPHY;  Surgeon: Yolonda Kida, MD;  Location: [facility];  Service: Cardiovascular;  Laterality: N/A;   REDUCTION MAMMAPLASTY Bilateral    1990   SHOULDER SURGERY Right    TUBAL LIGATION  2863   UMBILICAL HERNIA REPAIR  2007    There were no vitals filed for this visit.   Subjective Assessment - 09/03/20 1347     Subjective Pt reports no pain at the start of tx. Pt has no new complaints at the start of tx in regards to the shoulder. Pt states that her ankle is feeling better.    Pertinent History Pt. states return to surgeon 06/25/20 for check up and possible stable removal. Pt. reports no other surgeries in the last 5 years. However, plans on getting TKAs to both LE once the shoulder rehab has resolved. Pt is currently  not active 2/2 bilat knee pain.  Pt. is retired and lives with husband.    Limitations House hold activities;Standing;Lifting    How long can you sit comfortably? WFL    How long can you stand comfortably? WFL with shoulder. limited by knee    How long can you walk comfortably? WFL    Patient Stated Goals Pt would like to return to independent house work and return to full functional activity.    Currently in Pain? No/denies    Pain Score 0-No pain    Pain Onset 1 to 4 weeks ago             Treatment:      Therapeutic Exercise:   Ball wall walk up x30 with verbal cueing to hold at end range flexion x5 secs    Wall ball ABCs at 90 deg of shoulder flexion.     Seated reciprocal bicep curls 6# x30 each arm.  Seated over head press 6# x5 poor elevation, 5# 4x5 with verbal/contact cueing to ensure proper form     Nautilus exercise done in seated posture to offload the ankle. Verbal and contact cueing to ensure proper mechanics for optimal tissue loading that is safe.  1) lat pull down 30# x30 2) scapular retraction/row 40#  x30     7# Box from floor to estimated counter height x5 with verbal cueing to ensure proper biomechanics during lifting.   40 foot box carry with 7# box, 12# box, 14.5# box. Pt attempted 17# box but was unable to lift at this time.     Manual Therapy:   Prolonged stretching to promote increased shoulder abd with intermittent STM to infraspinatus and teres minor. 13 min.    *All tx completed was agreeable with surgical protocol.*   Ice at the end of tx 10 mins. No billed         PT Education - 09/03/20 1449     Education provided Yes    Education Details Pt was educated on proper lift technique with 12 that is allowed with the 12th week of the surgical protocol.    Person(s) Educated Patient    Methods Explanation;Demonstration;Tactile cues    Comprehension Verbalized understanding;Need further instruction              PT Short Term Goals  - 07/11/20 1520       PT SHORT TERM GOAL #1   Title Pt will begin UE AROM exercises to facilitate independence with light ADLs (Feeding, grooming) per post-surgical protocol.    Baseline Unable to complete AROM. 7/7: Pt is able to feed and wash face with her RUE    Time 3    Period Weeks    Status Achieved    Target Date 07/09/20      PT SHORT TERM GOAL #2   Title Pt will display proper strength, posture, and confidence with RUE to discontinue sling for increase access to independent ADL function.    Baseline Doned sling during all times of day/night besides HEP and grooming. 7/7 Pt currently does not wear the sling in the home during the day per-protocol.    Time 3    Period Weeks    Status Achieved    Target Date 07/09/20               PT Long Term Goals - 08/29/20 1351       PT LONG TERM GOAL #1   Title Pt will score a 58 on the FOTO to promote objective functional return to PLOF    Baseline 6/14: 28 7/14: 53 8/25:53    Time 12    Period Weeks    Status Partially Met    Target Date 09/10/20      PT LONG TERM GOAL #2   Title Pt. will achieve R shoulder Flex+ABD AROM to 120 deg to promote independence with ADLs.    Baseline 06/18/20 abd: 72 deg./  flex: 65 deg. 07/18/20: AROM: flex 90 abd 80 8/25: AROM flex: 110 ext 105 deg    Time 12    Period Weeks    Status Partially Met    Target Date 09/10/20      PT LONG TERM GOAL #3   Title Pt will improve R shoulder ER AAROM to 30 deg to promote independence with ADLs.    Baseline 11 deg. on 6/14 7/14: 30 deg 8/25: AROM 62 deg    Time 12    Period Weeks    Status Achieved    Target Date 09/10/20      PT LONG TERM GOAL #4   Title Pt will achieve R shoulder MMT 4/5 to facilitate workload tolerance similar to ADLs    Baseline N/T 07/18/20 R shoulder flex: 3+/5 abd 3+/5 ER 4-/5 IR  3+/5 Elbow flex+Ext 4-/5. 8/25: R shoulder flex: -4/5 ABD 3+/5, Elbow flexion 4+/5, Elbow ext 4/5.    Time 12    Period Weeks    Status Partially  Met    Target Date 09/10/20      PT LONG TERM GOAL #5   Title Pt will report <2/10 pain with reaching/lifting tasks to promote functional ability so that she can participate in household tasks.    Baseline 2/10 R shoulder pain currently at rest. 7/14: 1/10 Pt states that is fearful of movement. 8/25: Pt states worse pain when reaching is 01/10/08, and is no longer fearful of movemnet.    Time 12    Period Weeks    Status Achieved    Target Date 09/10/20                   Plan - 09/03/20 1451     Clinical Impression Statement Today's treatment was focused on the progression of resisted lifting movements that are agreeable with the 12th week of the surgical protocol. Pt was educated on monitoring soreness to ensure proper tissue loading moving forward. Pt continues to display mark UE endurance, strength, and ROM deficits resulting in increased pain, and limited access to QOL. Pt. will continue to benefit from skilled physical therapy to progress POC to address remaining deficits to facilitate maximum functional capacity for optimal personal health and wellness for ADLs.    Examination-Activity Limitations Hygiene/Grooming;Bathing;Self Feeding    Examination-Participation Restrictions Laundry;Cleaning;Driving;Meal Prep    Stability/Clinical Decision Making Evolving/Moderate complexity    Clinical Decision Making Moderate    Rehab Potential Good    Clinical Impairments Affecting Rehab Potential Negative: chronic nature of pain, comorbidities. Positive: motivated, family support    PT Frequency 2x / week    PT Duration 12 weeks    PT Treatment/Interventions Electrical Stimulation;Fluidtherapy;Moist Heat;Functional mobility training;Therapeutic activities;Therapeutic exercise;Patient/family education;Manual techniques;Scar mobilization;Passive range of motion;Balance training;Neuromuscular re-education;Cryotherapy;DME Instruction    PT Next Visit Plan Continue strengthening of the R  shoulder.  Reassess scar sensitivity.    PT Home Exercise Plan See HEP.  Access Code: [email or web address]  updated isometric [email or web address] updated band: [id number]    Consulted and Agree with Plan of Care Patient             Patient will benefit from skilled therapeutic intervention in order to improve the following deficits and impairments:  Decreased endurance, Decreased mobility, Decreased skin integrity, Hypomobility, Impaired sensation, Improper body mechanics, Increased edema, Decreased scar mobility, Decreased range of motion, Decreased activity tolerance, Decreased strength, Impaired UE functional use, Pain  Visit Diagnosis: Decreased ROM of left shoulder  Decreased strength, endurance, and mobility  Acute pain of left shoulder     Problem List Patient Active Problem List   Diagnosis Date Noted   Pancreatic cyst 11/21/2018   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[place]) 09/21/2018   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   Leucopenia 07/06/2018   B12 deficiency 07/06/2018   Fatty liver disease, nonalcoholic 65/53/7482   Onychomycosis 07/20/2017   Hypertensive disorder 12/03/2016   Intracranial aneurysm 12/03/2016   Family history of brain aneurysm 08/07/2016   Obstructive sleep apnea 06/17/2016   Hyperglycemia, unspecified 01/29/2016   Morbid obesity with BMI of 45.0-49.9, adult ([place]) 04/08/2015   Myalgia 11/15/2014   Costochondritis 04/11/2014   Osteopenia 04/11/2014   Prediabetes 02/15/2014   Hypercholesterolemia 09/26/2013   Degenerative arthritis of left knee 06/02/2013   Pura Spice, PT, DPT # [id number] Fara Olden SPT 09/04/2020, 8:31 AM  [facility] [address] [place], [place], [address] Phone: [phone]   Fax:  [phone]  Name: Paidyn Mcferran MRN: [id number] Date of Birth: [birth year]

## 2020-09-04 ENCOUNTER — Encounter: Payer: Self-pay | Admitting: Physical Therapy

## 2020-09-05 ENCOUNTER — Other Ambulatory Visit: Payer: Self-pay

## 2020-09-05 ENCOUNTER — Ambulatory Visit: Payer: Medicare HMO | Attending: Orthopedic Surgery | Admitting: Physical Therapy

## 2020-09-05 ENCOUNTER — Encounter: Payer: Self-pay | Admitting: Physical Therapy

## 2020-09-05 DIAGNOSIS — R531 Weakness: Secondary | ICD-10-CM | POA: Diagnosis present

## 2020-09-05 DIAGNOSIS — R6889 Other general symptoms and signs: Secondary | ICD-10-CM | POA: Insufficient documentation

## 2020-09-05 DIAGNOSIS — M25612 Stiffness of left shoulder, not elsewhere classified: Secondary | ICD-10-CM | POA: Diagnosis not present

## 2020-09-05 DIAGNOSIS — Z7409 Other reduced mobility: Secondary | ICD-10-CM | POA: Insufficient documentation

## 2020-09-05 DIAGNOSIS — M25512 Pain in left shoulder: Secondary | ICD-10-CM | POA: Diagnosis present

## 2020-09-05 NOTE — Therapy (Signed)
[facility] [address] [place], [place], [address] Phone: [phone]   Fax:  [phone]  Physical Therapy Treatment  Patient Details  Name: Darlene Chen MRN: [id number] Date of Birth: March 12, 1950 Referring Provider (PT): Johna Sheriff, MD   Encounter Date: 09/05/2020   PT End of Session - 09/05/20 1638     Visit Number 22    Number of Visits 24    Date for PT Re-Evaluation 09/10/20    Authorization - Visit Number 2    Authorization - Number of Visits 10    PT Start Time 0109    PT Stop Time 3235    PT Time Calculation (min) 46 min    Activity Tolerance Patient tolerated treatment well;No increased pain    Behavior During Therapy WFL for tasks assessed/performed             Past Medical History:  Diagnosis Date   Diabetes mellitus without complication ([place])    High cholesterol    Hypertension    Neutropenia (HCC)     Past Surgical History:  Procedure Laterality Date   arthroscopic knee surgery  on left 2000     BREAST BIOPSY Right 2019   bx/clip-neg   LEFT HEART CATH AND CORONARY ANGIOGRAPHY N/A 02/09/2017   Procedure: LEFT HEART CATH AND CORONARY ANGIOGRAPHY;  Surgeon: Yolonda Kida, MD;  Location: [facility];  Service: Cardiovascular;  Laterality: N/A;   REDUCTION MAMMAPLASTY Bilateral    1990   SHOULDER SURGERY Right    TUBAL LIGATION  5732   UMBILICAL HERNIA REPAIR  2007    There were no vitals filed for this visit.   Subjective Assessment - 09/05/20 1354     Subjective Pt presents to tx without complaints of pain or issues with the shoulder. Pt states that she had mild/mod soreness after last tx that reduce to baseline within 24 hours.    Pertinent History Pt. states return to surgeon 06/25/20 for check up and possible stable removal. Pt. reports no other surgeries in the last 5 years. However, plans on getting TKAs to both LE once the shoulder rehab has resolved. Pt  is currently not active 2/2 bilat knee pain.  Pt. is retired and lives with husband.    Limitations House hold activities;Standing;Lifting    How long can you sit comfortably? WFL    How long can you stand comfortably? WFL with shoulder. limited by knee    How long can you walk comfortably? WFL    Patient Stated Goals Pt would like to return to independent house work and return to full functional activity.    Currently in Pain? No/denies    Pain Score 0-No pain    Pain Onset 1 to 4 weeks ago               Treatment:      Therapeutic Exercise:   Ball wall walk up x with verbal cueing to hold at end range flexion x5 secs    Wall ball ABCs at 90 deg of shoulder abduction      Seated reciprocal bicep curls 5# x30 each arm. Reduced from 6# 2/2 soreness after last tx.    Seated over head press 6# x5 poor elevation, 5# 4x5 with verbal/contact cueing to ensure proper form     Nautilus exercise done in seated posture to offload the ankle. Verbal and contact cueing to ensure proper mechanics for optimal tissue loading that is safe.  1) lat pull down 30# x30 2) scapular retraction/row 40# x30 3) pull down row 30 #x30    Shoulder press outs seated with 1 6# Bd and bilat UE assist 3x10    Manual Therapy:   STM to distal pectoralis major during prolonged and progressing shoulder abd 15 min.    *All tx completed was agreeable with surgical protocol.*   Ice at the end of tx 10 mins. No billed       PT Education - 09/05/20 1638     Education provided Yes    Education Details Pt was educated on possible increase in DOMS with progression of therex.    Person(s) Educated Patient    Methods Explanation    Comprehension Verbalized understanding              PT Short Term Goals - 07/11/20 1520       PT SHORT TERM GOAL #1   Title Pt will begin UE AROM exercises to facilitate independence with light ADLs (Feeding, grooming) per post-surgical protocol.    Baseline Unable to  complete AROM. 7/7: Pt is able to feed and wash face with her RUE    Time 3    Period Weeks    Status Achieved    Target Date 07/09/20      PT SHORT TERM GOAL #2   Title Pt will display proper strength, posture, and confidence with RUE to discontinue sling for increase access to independent ADL function.    Baseline Doned sling during all times of day/night besides HEP and grooming. 7/7 Pt currently does not wear the sling in the home during the day per-protocol.    Time 3    Period Weeks    Status Achieved    Target Date 07/09/20               PT Long Term Goals - 08/29/20 1351       PT LONG TERM GOAL #1   Title Pt will score a 58 on the FOTO to promote objective functional return to PLOF    Baseline 6/14: 28 7/14: 53 8/25:53    Time 12    Period Weeks    Status Partially Met    Target Date 09/10/20      PT LONG TERM GOAL #2   Title Pt. will achieve R shoulder Flex+ABD AROM to 120 deg to promote independence with ADLs.    Baseline 06/18/20 abd: 72 deg./  flex: 65 deg. 07/18/20: AROM: flex 90 abd 80 8/25: AROM flex: 110 ext 105 deg    Time 12    Period Weeks    Status Partially Met    Target Date 09/10/20      PT LONG TERM GOAL #3   Title Pt will improve R shoulder ER AAROM to 30 deg to promote independence with ADLs.    Baseline 11 deg. on 6/14 7/14: 30 deg 8/25: AROM 62 deg    Time 12    Period Weeks    Status Achieved    Target Date 09/10/20      PT LONG TERM GOAL #4   Title Pt will achieve R shoulder MMT 4/5 to facilitate workload tolerance similar to ADLs    Baseline N/T 07/18/20 R shoulder flex: 3+/5 abd 3+/5 ER 4-/5 IR 3+/5 Elbow flex+Ext 4-/5. 8/25: R shoulder flex: -4/5 ABD 3+/5, Elbow flexion 4+/5, Elbow ext 4/5.    Time 12    Period Weeks    Status Partially Met  Target Date 09/10/20      PT LONG TERM GOAL #5   Title Pt will report <2/10 pain with reaching/lifting tasks to promote functional ability so that she can participate in household tasks.     Baseline 2/10 R shoulder pain currently at rest. 7/14: 1/10 Pt states that is fearful of movement. 8/25: Pt states worse pain when reaching is 01/10/08, and is no longer fearful of movemnet.    Time 12    Period Weeks    Status Achieved    Target Date 09/10/20                   Plan - 09/05/20 1639     Clinical Impression Statement Today tx was largely completed in seated posture 2/2 R ankle pain. Pt's dumbbell therex was reduced by 1# 2/2 fatigue from the previous tx. Pt stated decreased stiffness in the ant aspect of the shoulder post manual intervention. Pt continues to display mark R UE endurance, strength, and ROM deficits resulting in decreased safe home/community mobiity, increased pain, and limited access to QOL. Pt. will continue to benefit from skilled physical therapy to progress POC to address remaining deficits to facilitate maximum functional capacity for optimal personal health and wellness for ADLs    Examination-Activity Limitations Hygiene/Grooming;Bathing;Self Feeding    Examination-Participation Restrictions Laundry;Cleaning;Driving;Meal Prep    Stability/Clinical Decision Making Evolving/Moderate complexity    Clinical Decision Making Moderate    Rehab Potential Good    Clinical Impairments Affecting Rehab Potential Negative: chronic nature of pain, comorbidities. Positive: motivated, family support    PT Frequency 2x / week    PT Duration 12 weeks    PT Treatment/Interventions Electrical Stimulation;Fluidtherapy;Moist Heat;Functional mobility training;Therapeutic activities;Therapeutic exercise;Patient/family education;Manual techniques;Scar mobilization;Passive range of motion;Balance training;Neuromuscular re-education;Cryotherapy;DME Instruction    PT Next Visit Plan Discharge planning    PT Home Exercise Plan See HEP.  Access Code: [email or web address]  updated isometric [email or web address] updated band: [id number]    Consulted and Agree with Plan of Care Patient              Patient will benefit from skilled therapeutic intervention in order to improve the following deficits and impairments:  Decreased endurance, Decreased mobility, Decreased skin integrity, Hypomobility, Impaired sensation, Improper body mechanics, Increased edema, Decreased scar mobility, Decreased range of motion, Decreased activity tolerance, Decreased strength, Impaired UE functional use, Pain  Visit Diagnosis: Decreased ROM of left shoulder  Acute pain of left shoulder  Decreased strength, endurance, and mobility     Problem List Patient Active Problem List   Diagnosis Date Noted   Pancreatic cyst 11/21/2018   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[place]) 09/21/2018   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   Leucopenia 07/06/2018   B12 deficiency 07/06/2018   Fatty liver disease, nonalcoholic 15/94/5859   Onychomycosis 07/20/2017   Hypertensive disorder 12/03/2016   Intracranial aneurysm 12/03/2016   Family history of brain aneurysm 08/07/2016   Obstructive sleep apnea 06/17/2016   Hyperglycemia, unspecified 01/29/2016   Morbid obesity with BMI of 45.0-49.9, adult ([place]) 04/08/2015   Myalgia 11/15/2014   Costochondritis 04/11/2014   Osteopenia 04/11/2014   Prediabetes 02/15/2014   Hypercholesterolemia 09/26/2013   Degenerative arthritis of left knee 06/02/2013   Pura Spice, PT, DPT # [id number] Fara Olden SPT 09/06/2020, 2:17 PM  [facility] [address] [place], [place], [address] Phone: [phone]   Fax:  [phone]  Name: Darlene Chen MRN: [id number] Date of Birth: 01/05/1951

## 2020-09-10 ENCOUNTER — Ambulatory Visit: Payer: Medicare HMO | Admitting: Physical Therapy

## 2020-09-10 ENCOUNTER — Encounter: Payer: Self-pay | Admitting: Physical Therapy

## 2020-09-10 ENCOUNTER — Other Ambulatory Visit: Payer: Self-pay | Admitting: Family Medicine

## 2020-09-10 ENCOUNTER — Other Ambulatory Visit: Payer: Self-pay

## 2020-09-10 DIAGNOSIS — M25612 Stiffness of left shoulder, not elsewhere classified: Secondary | ICD-10-CM

## 2020-09-10 DIAGNOSIS — Z1231 Encounter for screening mammogram for malignant neoplasm of breast: Secondary | ICD-10-CM

## 2020-09-10 DIAGNOSIS — M25512 Pain in left shoulder: Secondary | ICD-10-CM

## 2020-09-10 DIAGNOSIS — R6889 Other general symptoms and signs: Secondary | ICD-10-CM

## 2020-09-10 NOTE — Therapy (Signed)
[facility] [address] [place], [place], [address] Phone: [phone]   Fax:  [phone]  Physical Therapy Treatment  Patient Details  Name: Darlene Chen MRN: [id number] Date of Birth: 19-Mar-1950 Referring Provider (PT): Johna Sheriff, MD   Encounter Date: 09/10/2020   PT End of Session - 09/10/20 1354     Visit Number 23    Number of Visits 24    Date for PT Re-Evaluation 09/10/20    Authorization - Visit Number 3    Authorization - Number of Visits 10    PT Start Time 3343    PT Stop Time 5686    PT Time Calculation (min) 43 min    Activity Tolerance Patient tolerated treatment well;No increased pain    Behavior During Therapy WFL for tasks assessed/performed             Past Medical History:  Diagnosis Date   Diabetes mellitus without complication ([place])    High cholesterol    Hypertension    Neutropenia (HCC)     Past Surgical History:  Procedure Laterality Date   arthroscopic knee surgery  on left 2000     BREAST BIOPSY Right 2019   bx/clip-neg   LEFT HEART CATH AND CORONARY ANGIOGRAPHY N/A 02/09/2017   Procedure: LEFT HEART CATH AND CORONARY ANGIOGRAPHY;  Surgeon: Yolonda Kida, MD;  Location: [facility];  Service: Cardiovascular;  Laterality: N/A;   REDUCTION MAMMAPLASTY Bilateral    1990   SHOULDER SURGERY Right    TUBAL LIGATION  1683   UMBILICAL HERNIA REPAIR  2007    There were no vitals filed for this visit.   Subjective Assessment - 09/10/20 1459     Subjective Pt present to tx without pain in the shoulder. Pt states that heavy lifting is still the c/c of the R shoulder. Pt is agreeable to possible d/c and adherence to gym program for maintenance of therapeutic gains.    Pertinent History Pt. states return to surgeon 06/25/20 for check up and possible stable removal. Pt. reports no other surgeries in the last 5 years. However, plans on getting TKAs to both LE  once the shoulder rehab has resolved. Pt is currently not active 2/2 bilat knee pain.  Pt. is retired and lives with husband.    Limitations House hold activities;Standing;Lifting    How long can you sit comfortably? WFL    How long can you stand comfortably? WFL with shoulder. limited by knee    How long can you walk comfortably? WFL    Patient Stated Goals Pt would like to return to independent house work and return to full functional activity.    Currently in Pain? No/denies    Pain Score 0-No pain    Pain Location Ankle    Pain Orientation Right    Pain Onset 1 to 4 weeks ago              Treatment:    Therapeutic Exercise:   Wall ball walk up x20 with verbal cueing to hold at end range flexion x5 secs    Wall ball ABCs at 90 deg of shoulder abduction      Seated reciprocal bicep curls 5# x30 each arm.    Seated overhead press 4# x30 verbal/contact cueing to ensure proper form     Nautilus exercise done in seated posture to offload the ankle. Verbal and contact cueing to ensure proper mechanics for optimal tissue loading  that is safe.  1) lat pull down 50# x30 2) scapular retraction/row 50# x30 3) pull down row 50 #x30    Shoulder press outs seated with 1 6# Bd and bilat UE assist 3x10   17# box carry 39f x6 with verbal cueing for proper lifting mechanics   Manual Therapy:   STM to distal pectoralis major during prolonged and progressing shoulder abd 13 min.    *All tx completed was agreeable with surgical protocol.*    Ice at the end of tx 10 mins. No billed      PT Short Term Goals - 07/11/20 1520       PT SHORT TERM GOAL #1   Title Pt will begin UE AROM exercises to facilitate independence with light ADLs (Feeding, grooming) per post-surgical protocol.    Baseline Unable to complete AROM. 7/7: Pt is able to feed and wash face with her RUE    Time 3    Period Weeks    Status Achieved    Target Date 07/09/20      PT SHORT TERM GOAL #2   Title Pt  will display proper strength, posture, and confidence with RUE to discontinue sling for increase access to independent ADL function.    Baseline Doned sling during all times of day/night besides HEP and grooming. 7/7 Pt currently does not wear the sling in the home during the day per-protocol.    Time 3    Period Weeks    Status Achieved    Target Date 07/09/20               PT Long Term Goals - 08/29/20 1351       PT LONG TERM GOAL #1   Title Pt will score a 58 on the FOTO to promote objective functional return to PLOF    Baseline 6/14: 28 7/14: 53 8/25:53    Time 12    Period Weeks    Status Partially Met    Target Date 09/10/20      PT LONG TERM GOAL #2   Title Pt. will achieve R shoulder Flex+ABD AROM to 120 deg to promote independence with ADLs.    Baseline 06/18/20 abd: 72 deg./  flex: 65 deg. 07/18/20: AROM: flex 90 abd 80 8/25: AROM flex: 110 ext 105 deg    Time 12    Period Weeks    Status Partially Met    Target Date 09/10/20      PT LONG TERM GOAL #3   Title Pt will improve R shoulder ER AAROM to 30 deg to promote independence with ADLs.    Baseline 11 deg. on 6/14 7/14: 30 deg 8/25: AROM 62 deg    Time 12    Period Weeks    Status Achieved    Target Date 09/10/20      PT LONG TERM GOAL #4   Title Pt will achieve R shoulder MMT 4/5 to facilitate workload tolerance similar to ADLs    Baseline N/T 07/18/20 R shoulder flex: 3+/5 abd 3+/5 ER 4-/5 IR 3+/5 Elbow flex+Ext 4-/5. 8/25: R shoulder flex: -4/5 ABD 3+/5, Elbow flexion 4+/5, Elbow ext 4/5.    Time 12    Period Weeks    Status Partially Met    Target Date 09/10/20      PT LONG TERM GOAL #5   Title Pt will report <2/10 pain with reaching/lifting tasks to promote functional ability so that she can participate in household tasks.  Baseline 2/10 R shoulder pain currently at rest. 7/14: 1/10 Pt states that is fearful of movement. 8/25: Pt states worse pain when reaching is 01/10/08, and is no longer fearful of  movemnet.    Time 12    Period Weeks    Status Achieved    Target Date 09/10/20                   Plan - 09/10/20 1513     Clinical Impression Statement Pt was able to tolerate progression of nautilus therex. Pt is happy with progression and open minded to continue gym adherence for maintenance of therapeutic gains. Pt is agreeable to possible d/c next tx. Pt continues to display mark UE endurance, strength, and ROM deficits resulting in, increased pain, and limited access to QOL. Pt. will continue to benefit from skilled physical therapy to progress POC to address remaining deficits to facilitate maximum functional capacity for optimal personal health and wellness for ADLs.    Examination-Activity Limitations Hygiene/Grooming;Bathing;Self Feeding    Examination-Participation Restrictions Laundry;Cleaning;Driving;Meal Prep    Stability/Clinical Decision Making Evolving/Moderate complexity    Clinical Decision Making Moderate    Rehab Potential Good    Clinical Impairments Affecting Rehab Potential Negative: chronic nature of pain, comorbidities. Positive: motivated, family support    PT Frequency 2x / week    PT Duration 12 weeks    PT Treatment/Interventions Electrical Stimulation;Fluidtherapy;Moist Heat;Functional mobility training;Therapeutic activities;Therapeutic exercise;Patient/family education;Manual techniques;Scar mobilization;Passive range of motion;Balance training;Neuromuscular re-education;Cryotherapy;DME Instruction    PT Next Visit Plan RECERT/DISCHARGE next tx. session    PT Home Exercise Plan See HEP.  Access Code: [email or web address]  updated isometric [email or web address] updated band: [id number]    Consulted and Agree with Plan of Care Patient             Patient will benefit from skilled therapeutic intervention in order to improve the following deficits and impairments:  Decreased endurance, Decreased mobility, Decreased skin integrity, Hypomobility, Impaired sensation, Improper  body mechanics, Increased edema, Decreased scar mobility, Decreased range of motion, Decreased activity tolerance, Decreased strength, Impaired UE functional use, Pain  Visit Diagnosis: Decreased ROM of left shoulder  Acute pain of left shoulder  Decreased strength, endurance, and mobility     Problem List Patient Active Problem List   Diagnosis Date Noted   Pancreatic cyst 11/21/2018   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[place]) 09/21/2018   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   Leucopenia 07/06/2018   B12 deficiency 07/06/2018   Fatty liver disease, nonalcoholic 62/13/0865   Onychomycosis 07/20/2017   Hypertensive disorder 12/03/2016   Intracranial aneurysm 12/03/2016   Family history of brain aneurysm 08/07/2016   Obstructive sleep apnea 06/17/2016   Hyperglycemia, unspecified 01/29/2016   Morbid obesity with BMI of 45.0-49.9, adult ([place]) 04/08/2015   Myalgia 11/15/2014   Costochondritis 04/11/2014   Osteopenia 04/11/2014   Prediabetes 02/15/2014   Hypercholesterolemia 09/26/2013   Degenerative arthritis of left knee 06/02/2013   Pura Spice, PT, DPT # [place] SPT 09/10/2020, 3:41 PM  [facility] [address] [place], [place], [address] Phone: [phone]   Fax:  [phone]  Name: Darlene Chen MRN: [id number] Date of Birth: 1950/08/03

## 2020-09-12 ENCOUNTER — Ambulatory Visit: Payer: Medicare HMO | Admitting: Physical Therapy

## 2020-09-12 ENCOUNTER — Encounter: Payer: Self-pay | Admitting: Physical Therapy

## 2020-09-12 ENCOUNTER — Other Ambulatory Visit: Payer: Self-pay

## 2020-09-12 DIAGNOSIS — M25612 Stiffness of left shoulder, not elsewhere classified: Secondary | ICD-10-CM | POA: Diagnosis not present

## 2020-09-12 DIAGNOSIS — R6889 Other general symptoms and signs: Secondary | ICD-10-CM

## 2020-09-12 DIAGNOSIS — M25512 Pain in left shoulder: Secondary | ICD-10-CM

## 2020-09-12 NOTE — Therapy (Signed)
[facility] [address] [place], [place], [address] Phone: [phone]   Fax:  [phone]  Physical Therapy Treatment, Recert, and Discharge 2/42/68-03/08/17  Patient Details  Name: Darlene Chen MRN: [id number] Date of Birth: 1950-05-26 Referring Provider (PT): Johna Sheriff, MD   Encounter Date: 09/12/2020   PT End of Session - 09/12/20 1507     Visit Number 24    Number of Visits 24    Date for PT Re-Evaluation 09/10/20    Authorization - Visit Number 4    Authorization - Number of Visits 10    PT Start Time 2119    PT Stop Time 4174    PT Time Calculation (min) 45 min    Activity Tolerance Patient tolerated treatment well;No increased pain    Behavior During Therapy WFL for tasks assessed/performed             Past Medical History:  Diagnosis Date   Diabetes mellitus without complication ([place])    High cholesterol    Hypertension    Neutropenia (HCC)     Past Surgical History:  Procedure Laterality Date   arthroscopic knee surgery  on left 2000     BREAST BIOPSY Right 2019   bx/clip-neg   LEFT HEART CATH AND CORONARY ANGIOGRAPHY N/A 02/09/2017   Procedure: LEFT HEART CATH AND CORONARY ANGIOGRAPHY;  Surgeon: Yolonda Kida, MD;  Location: [facility];  Service: Cardiovascular;  Laterality: N/A;   REDUCTION MAMMAPLASTY Bilateral    1990   SHOULDER SURGERY Right    TUBAL LIGATION  0814   UMBILICAL HERNIA REPAIR  2007    There were no vitals filed for this visit.   Subjective Assessment - 09/12/20 1346     Subjective Pt present to tx without complaints of pain. Pt had no soreness after last tx. Pt states that she is 90% returned to PLOF. Pt is agreeable to discharge today with updated HEP for gym.    Pertinent History Pt. states return to surgeon 06/25/20 for check up and possible stable removal. Pt. reports no other surgeries in the last 5 years. However, plans on getting TKAs  to both LE once the shoulder rehab has resolved. Pt is currently not active 2/2 bilat knee pain.  Pt. is retired and lives with husband.    Limitations House hold activities;Standing;Lifting    How long can you sit comfortably? WFL    How long can you stand comfortably? WFL with shoulder. limited by knee    How long can you walk comfortably? WFL    Patient Stated Goals Pt would like to return to independent house work and return to full functional activity.    Currently in Pain? No/denies    Pain Score 0-No pain    Pain Onset 1 to 4 weeks ago               Reassessment:    FOTO: 58  AROM flex: 121 ext 108 deg ER 62   MMT: R shoulder flex: -4/5 ABD 3+/5, Elbow flexion 5/5, Elbow ext 5/5.   Therapeutic Exercise:  Access Code: [email or web address] URL: [email or web address] Date: 09/12/2020 Prepared by: Dorcas Carrow  Exercises  Seated Row [facility] - 1 x daily - 3 x weekly - 3 sets - 10 reps Lat Pull Down - 1 x daily - 3 x weekly - 3 sets - 10 reps Triceps Extension - 1 x daily - 3 x weekly - 3 sets - 10  reps Seated Chest Press with Anchored Resistance - 1 x daily - 3 x weekly - 3 sets - 10 reps Shoulder External Rotation and Scapular Retraction with Resistance - 1 x daily - 3 x weekly - 3 sets - 10 reps Standing Shoulder Horizontal Abduction with Resistance - 1 x daily - 3 x weekly - 3 sets - 10 reps Seated Bilateral Shoulder Scaption with Dumbbells - 1 x daily - 3 x weekly - 3 sets - 10 reps Seated Overhead Press with Dumbbells - 1 x daily - 3 x weekly - 3 sets - 10 reps      PT Education - 09/12/20 1506     Education provided Yes    Education Details Pt was educatd on updated HEP with gym focus to faciliate continued theraputic gains.    Person(s) Educated Patient    Methods Explanation;Demonstration;Tactile cues;Verbal cues;Handout    Comprehension Verbalized understanding              PT Short Term Goals - 07/11/20 1520       PT SHORT TERM  GOAL #1   Title Pt will begin UE AROM exercises to facilitate independence with light ADLs (Feeding, grooming) per post-surgical protocol.    Baseline Unable to complete AROM. 7/7: Pt is able to feed and wash face with her RUE    Time 3    Period Weeks    Status Achieved    Target Date 07/09/20      PT SHORT TERM GOAL #2   Title Pt will display proper strength, posture, and confidence with RUE to discontinue sling for increase access to independent ADL function.    Baseline Doned sling during all times of day/night besides HEP and grooming. 7/7 Pt currently does not wear the sling in the home during the day per-protocol.    Time 3    Period Weeks    Status Achieved    Target Date 07/09/20               PT Long Term Goals - 09/12/20 1348       PT LONG TERM GOAL #1   Title Pt will score a 58 on the FOTO to promote objective functional return to PLOF    Baseline 6/14: 28 7/14: 53 8/25:53 9/8: 58    Time 12    Period Weeks    Status Achieved    Target Date 09/12/20      PT LONG TERM GOAL #2   Title Pt. will achieve R shoulder Flex+ABD AROM to 120 deg to promote independence with ADLs.    Baseline 06/18/20 abd: 72 deg./  flex: 65 deg. 07/18/20: AROM: flex 90 abd 80 8/25: AROM flex: 110 ext 105 deg 9/8:  AROM flex: 121 ext 108 deg    Time 12    Period Weeks    Status Partially Met    Target Date 09/12/20      PT LONG TERM GOAL #3   Title Pt will improve R shoulder ER AAROM to 30 deg to promote independence with ADLs.    Baseline 11 deg. on 6/14 7/14: 30 deg 8/25: AROM 62 deg    Time 12    Period Weeks    Status Achieved    Target Date 09/12/20      PT LONG TERM GOAL #4   Title Pt will achieve R shoulder MMT 4/5 to facilitate workload tolerance similar to ADLs    Baseline N/T 07/18/20 R shoulder flex: 3+/5  abd 3+/5 ER 4-/5 IR 3+/5 Elbow flex+Ext 4-/5. 8/25: R shoulder flex: -4/5 ABD 3+/5, Elbow flexion 4+/5, Elbow ext 4/5. 9/8 :  R shoulder flex: -4/5 ABD 3+/5, Elbow flexion  5/5, Elbow ext 5/5.    Time 12    Period Weeks    Status Partially Met    Target Date 09/12/20      PT LONG TERM GOAL #5   Title Pt will report <2/10 pain with reaching/lifting tasks to promote functional ability so that she can participate in household tasks.    Baseline 2/10 R shoulder pain currently at rest. 7/14: 1/10 Pt states that is fearful of movement. 8/25: Pt states worse pain when reaching is 01/10/08, and is no longer fearful of movemnet.    Time 12    Period Weeks    Status Achieved    Target Date 09/12/20                   Plan - 09/12/20 1507     Clinical Impression Statement Pt was reassess and d/c on this date 09/12/20. Pt reassessment displayed the following findings: FOTO: 79. AROM flex: 121 ext 108 deg ER 62. PROM: WNL bilat MMT: R shoulder flex: -4/5 ABD 3+/5, Elbow flexion 5/5, Elbow ext 5/5. Pt continues to display strength deficits especially in abduction. Additional strength gain will be address with updated HEP targeting focal strengthening of bilat shoulder and parascapular muscles. Pt was discharged with good prognosis and maximal return to PLOF.    Examination-Activity Limitations Hygiene/Grooming;Bathing;Self Feeding    Examination-Participation Restrictions Laundry;Cleaning;Driving;Meal Prep    Stability/Clinical Decision Making Evolving/Moderate complexity    Clinical Decision Making Moderate    Rehab Potential Good    Clinical Impairments Affecting Rehab Potential Negative: chronic nature of pain, comorbidities. Positive: motivated, family support    PT Frequency 2x / week    PT Duration 12 weeks    PT Treatment/Interventions Electrical Stimulation;Fluidtherapy;Moist Heat;Functional mobility training;Therapeutic activities;Therapeutic exercise;Patient/family education;Manual techniques;Scar mobilization;Passive range of motion;Balance training;Neuromuscular re-education;Cryotherapy;DME Instruction    PT Home Exercise Plan See HEP.  Access Code:  [email or web address]  updated isometric [email or web address] updated band: [id number]    Consulted and Agree with Plan of Care Patient             Patient will benefit from skilled therapeutic intervention in order to improve the following deficits and impairments:  Decreased endurance, Decreased mobility, Decreased skin integrity, Hypomobility, Impaired sensation, Improper body mechanics, Increased edema, Decreased scar mobility, Decreased range of motion, Decreased activity tolerance, Decreased strength, Impaired UE functional use, Pain  Visit Diagnosis: Decreased ROM of left shoulder  Acute pain of left shoulder  Decreased strength, endurance, and mobility     Problem List Patient Active Problem List   Diagnosis Date Noted   Pancreatic cyst 11/21/2018   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[place]) 09/21/2018   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   Leucopenia 07/06/2018   B12 deficiency 07/06/2018   Fatty liver disease, nonalcoholic 50/53/9767   Onychomycosis 07/20/2017   Hypertensive disorder 12/03/2016   Intracranial aneurysm 12/03/2016   Family history of brain aneurysm 08/07/2016   Obstructive sleep apnea 06/17/2016   Hyperglycemia, unspecified 01/29/2016   Morbid obesity with BMI of 45.0-49.9, adult ([place]) 04/08/2015   Myalgia 11/15/2014   Costochondritis 04/11/2014   Osteopenia 04/11/2014   Prediabetes 02/15/2014   Hypercholesterolemia 09/26/2013   Degenerative arthritis of left knee 06/02/2013   Pura Spice, PT, DPT # [id number] Fara Olden, SPT 09/12/2020, 4:12 PM  [place]  [facility] [address] [place], [place], [address] Phone: [phone]   Fax:  [phone]  Name: Darlene Chen MRN: [id number] Date of Birth: [birth year]

## 2020-09-13 ENCOUNTER — Other Ambulatory Visit: Payer: Self-pay | Admitting: Podiatry

## 2020-09-13 DIAGNOSIS — M76821 Posterior tibial tendinitis, right leg: Secondary | ICD-10-CM

## 2020-09-17 ENCOUNTER — Encounter: Payer: Medicare HMO | Admitting: Physical Therapy

## 2020-09-19 ENCOUNTER — Ambulatory Visit: Payer: Medicare HMO | Admitting: Physical Therapy

## 2020-09-20 ENCOUNTER — Ambulatory Visit
Admission: RE | Admit: 2020-09-20 | Discharge: 2020-09-20 | Disposition: A | Discharge status: Home / Self Care | Payer: Medicare HMO | Source: Ambulatory Visit | Attending: Podiatry | Admitting: Podiatry

## 2020-09-20 ENCOUNTER — Other Ambulatory Visit: Payer: Self-pay

## 2020-09-20 ENCOUNTER — Ambulatory Visit: Admission: RE | Admit: 2020-09-20 | Payer: Medicare HMO | Source: Ambulatory Visit

## 2020-09-20 DIAGNOSIS — M76821 Posterior tibial tendinitis, right leg: Secondary | ICD-10-CM | POA: Insufficient documentation

## 2020-09-24 ENCOUNTER — Ambulatory Visit
Admission: RE | Admit: 2020-09-24 | Discharge: 2020-09-24 | Disposition: A | Discharge status: Home / Self Care | Payer: Medicare HMO | Source: Ambulatory Visit | Attending: Family Medicine | Admitting: Family Medicine

## 2020-09-24 ENCOUNTER — Other Ambulatory Visit: Payer: Self-pay

## 2020-09-24 DIAGNOSIS — Z1231 Encounter for screening mammogram for malignant neoplasm of breast: Secondary | ICD-10-CM | POA: Insufficient documentation

## 2020-11-05 ENCOUNTER — Other Ambulatory Visit: Payer: Self-pay

## 2020-11-05 ENCOUNTER — Encounter: Payer: Self-pay | Admitting: Physical Therapy

## 2020-11-05 ENCOUNTER — Ambulatory Visit: Payer: Medicare HMO | Attending: Orthopedic Surgery | Admitting: Physical Therapy

## 2020-11-05 DIAGNOSIS — M25671 Stiffness of right ankle, not elsewhere classified: Secondary | ICD-10-CM | POA: Insufficient documentation

## 2020-11-05 DIAGNOSIS — R2689 Other abnormalities of gait and mobility: Secondary | ICD-10-CM | POA: Diagnosis not present

## 2020-11-05 DIAGNOSIS — M76821 Posterior tibial tendinitis, right leg: Secondary | ICD-10-CM | POA: Insufficient documentation

## 2020-11-05 DIAGNOSIS — M25612 Stiffness of left shoulder, not elsewhere classified: Secondary | ICD-10-CM | POA: Insufficient documentation

## 2020-11-05 DIAGNOSIS — R6889 Other general symptoms and signs: Secondary | ICD-10-CM | POA: Diagnosis present

## 2020-11-05 DIAGNOSIS — Z7409 Other reduced mobility: Secondary | ICD-10-CM | POA: Diagnosis present

## 2020-11-05 DIAGNOSIS — R52 Pain, unspecified: Secondary | ICD-10-CM | POA: Insufficient documentation

## 2020-11-05 DIAGNOSIS — R531 Weakness: Secondary | ICD-10-CM | POA: Diagnosis present

## 2020-11-05 NOTE — Patient Instructions (Signed)
Access Code: [email or web address] URL: [email or web address] Date: 11/05/2020 Prepared by: Dorcas Carrow  Exercises Seated Heel Toe Raises - 1 x daily - 5 x weekly - 3 sets - 10 reps Seated Heel Raise - 1 x daily - 5 x weekly - 3 sets - 10 reps Long Sitting Ankle Eversion with Resistance - 1 x daily - 5 x weekly - 3 sets - 10 reps Towel Scrunches - 1 x daily - 5 x weekly - 3 sets - 10 reps - 3 hold

## 2020-11-05 NOTE — Therapy (Signed)
[facility] [address] [place], [place], [address] Phone: [phone]   Fax:  [phone]  Physical Therapy Treatment and Initial Evaluation 11/05/2020  Patient Details  Name: Darlene Chen MRN: [id number] Date of Birth: 1950/12/02 Referring Provider (PT): Caroline More DPM   Encounter Date: 11/05/2020   PT End of Session - 11/05/20 1151     Visit Number 1    Number of Visits 16    Date for PT Re-Evaluation 12/31/20    Authorization - Visit Number 1    Authorization - Number of Visits 10    PT Start Time 1109    PT Stop Time 1601    PT Time Calculation (min) 50 min    Activity Tolerance Patient tolerated treatment well;No increased pain    Behavior During Therapy WFL for tasks assessed/performed             Past Medical History:  Diagnosis Date   Diabetes mellitus without complication ([place])    High cholesterol    Hypertension    Neutropenia (HCC)     Past Surgical History:  Procedure Laterality Date   arthroscopic knee surgery  on left 2000     BREAST BIOPSY Right 2019   bx/clip-neg   LEFT HEART CATH AND CORONARY ANGIOGRAPHY N/A 02/09/2017   Procedure: LEFT HEART CATH AND CORONARY ANGIOGRAPHY;  Surgeon: Yolonda Kida, MD;  Location: [facility];  Service: Cardiovascular;  Laterality: N/A;   REDUCTION MAMMAPLASTY Bilateral    1990   SHOULDER SURGERY Right    TUBAL LIGATION  0932   UMBILICAL HERNIA REPAIR  2007    There were no vitals filed for this visit.   Subjective Assessment - 11/05/20 1306     Subjective Pt presents to tx with c/c of R medial ankle pain. Pt reports that Dr. Luana Shu states  that surgical intervention could be considered, however, exhaustion of conservative tx should be attempted before consideration. Pt states insidious onset of pain. Pt states sx are ISQ with benefit from steroid taper in 09/2020. Pt has a follow up with Dr. Luana Shu 12/31/2020. Pt has a follow up with  [facility] for foot orthotic fitting and shoe recommendation.    Pertinent History Pt would like to avoid surgery. [facility] for foot orthotic. Pt is planning for TKA  of both knees next year.    Limitations House hold activities;Standing;Lifting;Walking    How long can you sit comfortably? WFL    How long can you stand comfortably? 10-15 mins    How long can you walk comfortably? 7-10 mins    Diagnostic tests MRI: IMPRESSION:  1. Moderate tendinosis of the posterior tibial tendon with a  interstitial tear.  2. 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  3. 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  4. Subcortical extra-articular bone marrow edema and cystic changes  in the lateral calcaneus and lateral talus as can be seen with  posterolateral ankle impingement.  5. Relative pes planus.    Patient Stated Goals Reduce R ankle pain and avoid surgery    Currently in Pain? Yes    Pain Location Ankle    Pain Orientation Right;Medial    Pain Type Chronic pain                [facility] PT Assessment - 11/05/20 0001       Assessment   Medical Diagnosis Posterior Tibial tendinitis of right L    Referring Provider (PT) Mitzi Hansen  Baker DPM    Onset Date/Surgical Date 08/12/20    Hand Dominance Right    Next MD Visit 12/31/20    Prior Therapy Yes   This year     Precautions   Precautions None      Restrictions   Weight Bearing Restrictions No      [place] residence      Prior Function   Level of Independence Independent      Cognition   Overall Cognitive Status Within Functional Limits for tasks assessed            SUBJECTIVE Chief complaint:  Pt presents to tx with c/c of R medial ankle pain. Pt reports that Dr. Luana Shu states  that surgical intervention could be consider, however, exhaustion of conservative tx should be attempted before consideration. Pt states insidious of pain. Pt states sx are ISQ with benefit  from steroid taper in 09/2020. Pt has a follow up with Dr. Luana Shu 12/31/2020. Pt has a follow up with [facility] for foot orthotic fitting and shoe recommendation. History: Pt would like to avoid surgery. [facility] for foot orthotic. Pt is planning for TKA  of both knees next year.  Referring Dx: Posterior Tibial tendinitis of right ankle  Referring Provider: Caroline More DPM Pain location: L medial  Pain: Present 0/10, Best 0/10, Worst 10/10: Pain quality: Ache, Sharp during quick movements Radiating pain: none Numbness/Tingling: none  24 hour pain behavior: Activity dependent Aggravating factors: Standing  Easing factors: Rest and ice  How long can you sit: WNL  How long can you stand: 10-15 mins  How long can you walk: 7-10 mins History of foot injury injury, pain, surgery, or therapy: none Follow-up appointment with MD: 12/31/2020 Dominant hand: Right Imaging: MRI 09/23/20 IMPRESSION: 1. Moderate tendinosis of the posterior tibial tendon with a interstitial tear. 2. 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 3. 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 4. Subcortical extra-articular bone marrow edema and cystic changes in the lateral calcaneus and lateral talus as can be seen with posterolateral ankle impingement. 5. Relative pes planus. Falls in the last 6 months: no  Occupational demands: Retired  Office manager: [employer]  Goals: Reduce R ankle pain and avoid surgery Red flags (bowel/bladder changes, saddle paresthesia, personal history of cancer, chills/fever, night sweats, unrelenting pain, first onset of insidious LBP <20 y/o) Negative    OBJECTIVE  FOTO: 51 with a predicted value of 56   Mental Status Patient is oriented to person, place and time.  Recent memory is intact.  Remote memory is intact.  Attention span and concentration are intact.  Expressive speech is intact.  Patient's fund of knowledge is within normal limits for educational  level.  SENSATION: Grossly intact to light touch bilateral LEs as determined by testing dermatomes L2-S2 Proprioception and hot/cold testing deferred on this date   MUSCULOSKELETAL/Posture:  Bilat Pes Planus and calcaneus valgus that is more dramatic in WB compared to non-WB. Localized edema at medial anterior ankle.   No abnormal callus noted on either feet.    Gait  Antalgic gait of RLE with WB shift to LUE during standing posture to reduce WB. Increased pronation during mid stance.    Palpation -TtP over medial ankle, ant medial ankle,     Strength (out of 5) R/L Knee MMT equal to time and long history bilat knee OA Ankle DF: 4/5 Ankle PL: 4/5  Ankle Inversion 4/5 Ankle Eversion: 4/5    *Indicates  pain   AROM (degrees) R/L (all movements include overpressure unless otherwise stated) Ankle DF (0-35): 13/14 Ankle PL (0-50:  20/21  Ankle Inversion (0-35) 11/29 Ankle Eversion: (0-35) 19/31 *Indicates pain   PROM (degrees) Same as AROM      Ankle joints mobilizations:  Ant talocrural: hypomobility bilat Post talocrural: hypomobility bilat Medial talar tilt hypermobile   Lateral talar tilt: hypomobile bilat with pain during R assessment  Navicular: hypomobility bilat   Navicular Drop test:  11 mm on right  6 mm on L       PT Education - 11/05/20 1526     Education provided Yes    Education Details Pt was educated on update HEP, POC, Prognosis, and current POC    Person(s) Educated Patient    Methods Explanation;Handout;Demonstration    Comprehension Verbalized understanding;Returned demonstration              PT Short Term Goals - 07/11/20 1520       PT SHORT TERM GOAL #1   Title Pt will begin UE AROM exercises to facilitate independence with light ADLs (Feeding, grooming) per post-surgical protocol.    Baseline Unable to complete AROM. 7/7: Pt is able to feed and wash face with her RUE    Time 3    Period Weeks    Status Achieved     Target Date 07/09/20      PT SHORT TERM GOAL #2   Title Pt will display proper strength, posture, and confidence with RUE to discontinue sling for increase access to independent ADL function.    Baseline Doned sling during all times of day/night besides HEP and grooming. 7/7 Pt currently does not wear the sling in the home during the day per-protocol.    Time 3    Period Weeks    Status Achieved    Target Date 07/09/20               PT Long Term Goals - 11/05/20 1531       PT LONG TERM GOAL #1   Title Pt will improve FOTO score to 57 to measure self reported improvement in functional ADLs    Baseline 38    Time 8    Period Weeks    Status New    Target Date 12/31/20      PT LONG TERM GOAL #2   Title Pt will increase R ankle strength of by at least 1/2 MMT grade in order to demonstrate improvement in strength and function.    Baseline MMT R/L:  Ankle DF: 4/5. Ankle PL: 4/5. Ankle Inversion 4/5. Ankle Eversion: 4/5    Time 8    Period Weeks    Status New    Target Date 12/31/20      PT LONG TERM GOAL #3   Title Pt will improve R ankle AROM equal to L to promote greater access to a normalized gait pattern    Baseline Ankle DF: 13/14. Ankle PL:  20/21. Ankle Inversion: 11/29. Ankle Eversion: 19/31    Time 8    Period Weeks    Status New    Target Date 12/31/20      PT LONG TERM GOAL #4   Title Pt will displayed a more normalized gait pattern with equal step length, and equal WB bilat to promote a greater access to mobility for future travel.    Baseline Antalgic gait of RLE with WB shift to LUE during standing posture to reduce WB. Increased  pronation during mid stance. Posture/alignment:  Bilat Pes Planus and calcaneus valgus that is more dramatic in WB compared to non-WB    Time 8    Period Weeks    Status New    Target Date 12/31/20      PT LONG TERM GOAL #5   Title Pt will displayed decrease in navicular drop on the R during WB posture to equal L for measurement  of greater R posterior tib firing for greater arch stability.    Baseline Navicular drop: 15mm R and 34mm L    Time 8    Period Weeks    Status New    Target Date 12/31/20                   Plan - 11/05/20 1527     Clinical Impression Statement Pt is a pleasant 70 year-old female referred for: Posterior Tibial tendinitis of right ankle . PT examination reveals the following deficits: FOTO: 38 with a predicted value of 57. MMT R/L:  Ankle DF: 4/5. Ankle PL: 4/5. Ankle Inversion 4/5. Ankle Eversion: 4/5. Ankle AROM: Ankle DF: 13/14.Ankle PL:  20/21. Ankle Inversion: 11/29. Ankle Eversion: 19/31. NPS: Present 0/10, Best 0/10, Worst 10/10. Joint mobility: Ant Talocrural: hypomobility bilat. Post talocrural: hypomobility bilat. Medial talar tilt hypermobile. Lateral talar tilt: hypomobile bilat with pain during R assessment. Navicular: hypomobility bilat. Gait: Antalgic gait of RLE with WB shift to LUE during standing posture to reduce WB. Increased pronation during mid stance. Posture/alignment:  Bilat Pes Planus and calcaneus valgus that is more dramatic in WB compared to non-WB. Localized edema at medial anterior ankle. Navicular drop: 7mm R and 67mm L. Pt case displays as moderate complexity with good prognosis for optimal return to PLOF due to time from onset, current level of daily physically activity, PMH, and PLOF. Pt will benefit from skilled PT 2x/week for 8 weeks to address deficits and promote safe independence with community and home related mobility and ADLs.    Examination-Activity Limitations Hygiene/Grooming;Bathing;Self Feeding    Examination-Participation Restrictions Laundry;Cleaning;Driving;Meal Prep    Stability/Clinical Decision Making Evolving/Moderate complexity    Clinical Decision Making Moderate    Rehab Potential Good    Clinical Impairments Affecting Rehab Potential Negative: chronic nature of pain, comorbidities. Positive: motivated, family support    PT Frequency  2x / week    PT Duration 8 weeks    PT Treatment/Interventions Electrical Stimulation;Fluidtherapy;Moist Heat;Functional mobility training;Therapeutic activities;Therapeutic exercise;Patient/family education;Manual techniques;Scar mobilization;Passive range of motion;Balance training;Neuromuscular re-education;Cryotherapy;DME Instruction;Biofeedback;Traction;Ultrasound;Stair training;Gait training;Dry needling;Energy conservation;Splinting;Taping;Spinal Manipulations;Joint Manipulations    PT Next Visit Plan HEP adherence    PT Home Exercise Plan [phone]    Recommended Other Services Not at this time.    Consulted and Agree with Plan of Care Patient             Patient will benefit from skilled therapeutic intervention in order to improve the following deficits and impairments:  Decreased endurance, Decreased mobility, Decreased skin integrity, Hypomobility, Impaired sensation, Improper body mechanics, Increased edema, Decreased scar mobility, Decreased range of motion, Decreased activity tolerance, Decreased strength, Impaired UE functional use, Pain, Impaired flexibility, Difficulty walking, Decreased coordination, Decreased balance, Abnormal gait  Visit Diagnosis: Painful gait  Decreased range of motion of right ankle  Posterior tibial tendinitis of right leg     Problem List Patient Active Problem List   Diagnosis Date Noted   Pancreatic cyst 11/21/2018   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[place]) 09/21/2018   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  Leucopenia 07/06/2018   B12 deficiency 07/06/2018   Fatty liver disease, nonalcoholic 59/16/3846   Onychomycosis 07/20/2017   Hypertensive disorder 12/03/2016   Intracranial aneurysm 12/03/2016   Family history of brain aneurysm 08/07/2016   Obstructive sleep apnea 06/17/2016   Hyperglycemia, unspecified 01/29/2016   Morbid obesity with BMI of 45.0-49.9, adult ([place]) 04/08/2015   Myalgia 11/15/2014   Costochondritis 04/11/2014    Osteopenia 04/11/2014   Prediabetes 02/15/2014   Hypercholesterolemia 09/26/2013   Degenerative arthritis of left knee 06/02/2013   Pura Spice, PT, DPT # [id number] Fara Olden, SPT 11/06/2020, 7:28 AM  [place] [facility] [address] [place], [place], [address] Phone: [phone]   Fax:  [phone]  Name: Darlene Chen MRN: [id number] Date of Birth: [birth year]

## 2020-11-07 ENCOUNTER — Other Ambulatory Visit: Payer: Self-pay

## 2020-11-07 ENCOUNTER — Ambulatory Visit: Payer: Medicare HMO

## 2020-11-07 ENCOUNTER — Encounter: Payer: Self-pay | Admitting: Physical Therapy

## 2020-11-07 DIAGNOSIS — R52 Pain, unspecified: Secondary | ICD-10-CM

## 2020-11-07 DIAGNOSIS — M25612 Stiffness of left shoulder, not elsewhere classified: Secondary | ICD-10-CM

## 2020-11-07 DIAGNOSIS — R2689 Other abnormalities of gait and mobility: Secondary | ICD-10-CM

## 2020-11-07 DIAGNOSIS — M76821 Posterior tibial tendinitis, right leg: Secondary | ICD-10-CM

## 2020-11-07 DIAGNOSIS — M25671 Stiffness of right ankle, not elsewhere classified: Secondary | ICD-10-CM

## 2020-11-07 NOTE — Therapy (Signed)
[place] [facility] [address]. [place], [place], [address] Phone: [phone]   Fax:  [phone]  Physical Therapy Treatment  Patient Details  Name: Zavia Pullen MRN: [id number] Date of Birth: [birth year] Referring Provider (PT): Caroline More DPM   Encounter Date: 11/07/2020   PT End of Session - 11/07/20 0945     Visit Number 2    Number of Visits 16    Date for PT Re-Evaluation 12/31/20    Authorization - Visit Number 2    Authorization - Number of Visits 10    PT Start Time 2703    PT Stop Time 1033    PT Time Calculation (min) 49 min    Activity Tolerance Patient tolerated treatment well;No increased pain    Behavior During Therapy WFL for tasks assessed/performed             Past Medical History:  Diagnosis Date   Diabetes mellitus without complication ([place])    High cholesterol    Hypertension    Neutropenia (HCC)     Past Surgical History:  Procedure Laterality Date   arthroscopic knee surgery  on left 2000     BREAST BIOPSY Right 2019   bx/clip-neg   LEFT HEART CATH AND CORONARY ANGIOGRAPHY N/A 02/09/2017   Procedure: LEFT HEART CATH AND CORONARY ANGIOGRAPHY;  Surgeon: Yolonda Kida, MD;  Location: [facility];  Service: Cardiovascular;  Laterality: N/A;   REDUCTION MAMMAPLASTY Bilateral    1990   SHOULDER SURGERY Right    TUBAL LIGATION  5009   UMBILICAL HERNIA REPAIR  2007    There were no vitals filed for this visit.   Subjective Assessment - 11/07/20 0944     Subjective Pt reports current 0/10 pain in R ankle. Pt accidentally shredded HEP with shred pile for work. Requesting new HEP.    Pertinent History Pt would like to avoid surgery. [facility] for foot orthotic. Pt is planning for TKA  of both knees next year.    Limitations House hold activities;Standing;Lifting;Walking    How long can you sit comfortably? WFL    How long can you stand comfortably? 10-15 mins    How long can  you walk comfortably? 7-10 mins    Diagnostic tests MRI: IMPRESSION:  1. Moderate tendinosis of the posterior tibial tendon with a  interstitial tear.  2. 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  3. 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  4. Subcortical extra-articular bone marrow edema and cystic changes  in the lateral calcaneus and lateral talus as can be seen with  posterolateral ankle impingement.  5. Relative pes planus.    Patient Stated Goals Reduce R ankle pain and avoid surgery    Currently in Pain? No/denies    Pain Score 0-No pain            Review HEP     Exercises on RLE for single limbe exercises Seated Heel Toe Raises - 1 x daily - 5 x weekly - 3 sets - 10 reps Seated Heel Raise - 1 x daily - 5 x weekly - 3 sets - 10 reps Long Sitting Ankle Eversion with Resistance - 1 x daily - 5 x weekly - 3 sets - 10 reps Towel Scrunches - 1 x daily - 5 x weekly - 3 sets - 10 reps - 3 hold   Additional there.ex:  Seated RTB R foot inversion: 2x10. Reports slight increase in R medial ankle  pain at post tib.  Seated R foot doming: 3x10, 2-3 sec holds. Good form/technique  Attempting standing hip abd with UE's on // bars: knee pain reported so discontinued   Seated hip ER/abd with red TB: 3x10, 2-3 sec hold   Standing calf raises with BUE support on // Bars: 2x10, reporting mild med ankle pain on RLE. Minimal heel clearance bilat with ant trunk lean.     Throughout session pt required min VC's and TC's for form/technique. Utilization of towel under heel in attempts to maintain neutral foot alignment during toe raises but [t denies any change in symptoms. Updated HEP given to pt. Pt verbalizing good understanding of exercises.        PT Education - 11/07/20 0945     Education provided Yes    Education Details Form/technique with exercises    Person(s) Educated Patient    Methods Explanation;Demonstration;Tactile cues;Verbal  cues    Comprehension Verbalized understanding;Returned demonstration              PT Short Term Goals - 07/11/20 1520       PT SHORT TERM GOAL #1   Title Pt will begin UE AROM exercises to facilitate independence with light ADLs (Feeding, grooming) per post-surgical protocol.    Baseline Unable to complete AROM. 7/7: Pt is able to feed and wash face with her RUE    Time 3    Period Weeks    Status Achieved    Target Date 07/09/20      PT SHORT TERM GOAL #2   Title Pt will display proper strength, posture, and confidence with RUE to discontinue sling for increase access to independent ADL function.    Baseline Doned sling during all times of day/night besides HEP and grooming. 7/7 Pt currently does not wear the sling in the home during the day per-protocol.    Time 3    Period Weeks    Status Achieved    Target Date 07/09/20               PT Long Term Goals - 11/05/20 1531       PT LONG TERM GOAL #1   Title Pt will improve FOTO score to 57 to measure self reported improvement in functional ADLs    Baseline 38    Time 8    Period Weeks    Status New    Target Date 12/31/20      PT LONG TERM GOAL #2   Title Pt will increase R ankle strength of by at least 1/2 MMT grade in order to demonstrate improvement in strength and function.    Baseline MMT R/L:  Ankle DF: 4/5. Ankle PL: 4/5. Ankle Inversion 4/5. Ankle Eversion: 4/5    Time 8    Period Weeks    Status New    Target Date 12/31/20      PT LONG TERM GOAL #3   Title Pt will improve R ankle AROM equal to L to promote greater access to a normalized gait pattern    Baseline Ankle DF: 13/14. Ankle PL:  20/21. Ankle Inversion: 11/29. Ankle Eversion: 19/31    Time 8    Period Weeks    Status New    Target Date 12/31/20      PT LONG TERM GOAL #4   Title Pt will displayed a more normalized gait pattern with equal step length, and equal WB bilat to promote a greater access to mobility for future travel.  Baseline Antalgic gait of RLE with WB shift to LUE during standing posture to reduce WB. Increased pronation during mid stance. Posture/alignment:  Bilat Pes Planus and calcaneus valgus that is more dramatic in WB compared to non-WB    Time 8    Period Weeks    Status New    Target Date 12/31/20      PT LONG TERM GOAL #5   Title Pt will displayed decrease in navicular drop on the R during WB posture to equal L for measurement of greater R posterior tib firing for greater arch stability.    Baseline Navicular drop: 49mm R and 8mm L    Time 8    Period Weeks    Status New    Target Date 12/31/20                   Plan - 11/07/20 1049     Clinical Impression Statement Pt displays good understanding of HEP givne at eval. Required new print out and updated HEP after today's session. Good understanding of form/technique with all exercises after demo/ minor cuing. Additional hip strengthening and resisted inversion with foot doming given in addition to HEP. Some minor pain reports with inversion exercise noted, so pt educated to discontinue if remains painful. Requires seated hip strengthening as standing hurts knees from reported knee OA bilat. Pt after session reports no pain at rest and is confident with updated HEP. Will continue to benefit from skilled PT to improve R ankle pain, foot/ankle strength, and gait mechanics to return to pain free standing and walking ADL's.    Examination-Activity Limitations Hygiene/Grooming;Bathing;Self Feeding    Examination-Participation Restrictions Laundry;Cleaning;Driving;Meal Prep    Stability/Clinical Decision Making Evolving/Moderate complexity    Clinical Decision Making Moderate    Rehab Potential Good    Clinical Impairments Affecting Rehab Potential Negative: chronic nature of pain, comorbidities. Positive: motivated, family support    PT Frequency 2x / week    PT Duration 8 weeks    PT Treatment/Interventions Electrical  Stimulation;Fluidtherapy;Moist Heat;Functional mobility training;Therapeutic activities;Therapeutic exercise;Patient/family education;Manual techniques;Scar mobilization;Passive range of motion;Balance training;Neuromuscular re-education;Cryotherapy;DME Instruction;Biofeedback;Traction;Ultrasound;Stair training;Gait training;Dry needling;Energy conservation;Splinting;Taping;Spinal Manipulations;Joint Manipulations    PT Next Visit Plan Reassess additional [place] and Agree with Plan of Care Patient             Patient will benefit from skilled therapeutic intervention in order to improve the following deficits and impairments:  Decreased endurance, Decreased mobility, Decreased skin integrity, Hypomobility, Impaired sensation, Improper body mechanics, Increased edema, Decreased scar mobility, Decreased range of motion, Decreased activity tolerance, Decreased strength, Impaired UE functional use, Pain, Impaired flexibility, Difficulty walking, Decreased coordination, Decreased balance, Abnormal gait  Visit Diagnosis: Painful gait  Decreased range of motion of right ankle  Posterior tibial tendinitis of right leg  Decreased ROM of left shoulder     Problem List Patient Active Problem List   Diagnosis Date Noted   Pancreatic cyst 11/21/2018   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[place]) 09/21/2018   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 Fairly IV, PT, DPT Physical Therapist- [facility]  11/07/2020, 11:00 AM   [facility] [address] [place], [place], [address] Phone: [phone]   Fax:  [phone]  Name: Griselle Rufer MRN: [id number] Date of Birth: 1950/11/05

## 2020-11-12 ENCOUNTER — Encounter: Payer: Self-pay | Admitting: Physical Therapy

## 2020-11-12 ENCOUNTER — Ambulatory Visit: Payer: Medicare HMO | Admitting: Physical Therapy

## 2020-11-12 ENCOUNTER — Other Ambulatory Visit: Payer: Self-pay

## 2020-11-12 DIAGNOSIS — R6889 Other general symptoms and signs: Secondary | ICD-10-CM

## 2020-11-12 DIAGNOSIS — R52 Pain, unspecified: Secondary | ICD-10-CM

## 2020-11-12 DIAGNOSIS — M25671 Stiffness of right ankle, not elsewhere classified: Secondary | ICD-10-CM

## 2020-11-12 DIAGNOSIS — M76821 Posterior tibial tendinitis, right leg: Secondary | ICD-10-CM

## 2020-11-12 DIAGNOSIS — R2689 Other abnormalities of gait and mobility: Secondary | ICD-10-CM | POA: Diagnosis not present

## 2020-11-12 DIAGNOSIS — R531 Weakness: Secondary | ICD-10-CM

## 2020-11-12 NOTE — Therapy (Signed)
[facility] [address]. [place], [place], [address] Phone: [phone]   Fax:  [phone]  Physical Therapy Treatment  Patient Details  Name: Darlene Chen MRN: [id number] Date of Birth: [birth year] Referring Provider (PT): Caroline More DPM   Encounter Date: 11/12/2020   PT End of Session - 11/12/20 1120     Visit Number 3    Number of Visits 16    Date for PT Re-Evaluation 12/31/20    Authorization - Visit Number 3    Authorization - Number of Visits 10    PT Start Time 0932    PT Stop Time 6712    PT Time Calculation (min) 47 min    Activity Tolerance Patient tolerated treatment well;No increased pain    Behavior During Therapy WFL for tasks assessed/performed             Past Medical History:  Diagnosis Date   Diabetes mellitus without complication ([place])    High cholesterol    Hypertension    Neutropenia (HCC)     Past Surgical History:  Procedure Laterality Date   arthroscopic knee surgery  on left 2000     BREAST BIOPSY Right 2019   bx/clip-neg   LEFT HEART CATH AND CORONARY ANGIOGRAPHY N/A 02/09/2017   Procedure: LEFT HEART CATH AND CORONARY ANGIOGRAPHY;  Surgeon: Yolonda Kida, MD;  Location: [facility];  Service: Cardiovascular;  Laterality: N/A;   REDUCTION MAMMAPLASTY Bilateral    1990   SHOULDER SURGERY Right    TUBAL LIGATION  4580   UMBILICAL HERNIA REPAIR  2007    There were no vitals filed for this visit.   Subjective Assessment - 11/12/20 1117     Subjective Pt presents to tx with 0/10 R ankle pain at the start of tx. Pt states that her HEP went well from last time.    Pertinent History Pt would like to avoid surgery. [facility] for foot orthotic. Pt is planning for TKA  of both knees next year.    Limitations House hold activities;Standing;Lifting;Walking    How long can you sit comfortably? WFL    How long can you stand comfortably? 10-15 mins    How long can you  walk comfortably? 7-10 mins    Diagnostic tests MRI: IMPRESSION:  1. Moderate tendinosis of the posterior tibial tendon with a  interstitial tear.  2. 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  3. 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  4. Subcortical extra-articular bone marrow edema and cystic changes  in the lateral calcaneus and lateral talus as can be seen with  posterolateral ankle impingement.  5. Relative pes planus.    Patient Stated Goals Reduce R ankle pain and avoid surgery    Currently in Pain? No/denies    Pain Score 0-No pain              Treatment   Therapeutic Exercise:  NuStep L3 at position 7 with UE assist and verbal cueing to facilitate complete plantigrade position of bilat feet for optimal joint preparing for treatment. 10 mins    Seated position to facilitate ankle strengthening with proper mechanics and loading  of the ankle complex. Verbal and contact cueing to facilitate optimal tissue loading and correct biomechanical alignment to ensure efficient and safe movement. X30 each movement unless otherwise noted.  1) heel raise  2) toe raises  3) BAPs board with direction of 12,3, 6, 9 o clock  4)  Ball ankle ABCs 5) PF/DF stretching on blue ankle roller with PT OP   Standing in the // bars with occ light touch assist. Verbal and contact cueing to facilitate optimal tissue loading and correct biomechanical alignment to ensure efficient and safe movement.  1) Airex standing with fwd/back and lateral weight shifting 4 mins  2) lateral cone toe taps     Manual Therapy 16 mins :   1) Lateral talar tilt mob Grade 2 30 sec x 3   2) Distraction 30 sec, not tolerated well 2/2 to pressure at the ant joint   3) Post talocrural joint mob, grade 2 30 x 3 * pt did not tolerate 3rd round well.    *ice wrap 10 mins * not billed*           PT Short Term Goals - 07/11/20 1520       PT SHORT TERM GOAL #1   Title  Pt will begin UE AROM exercises to facilitate independence with light ADLs (Feeding, grooming) per post-surgical protocol.    Baseline Unable to complete AROM. 7/7: Pt is able to feed and wash face with her RUE    Time 3    Period Weeks    Status Achieved    Target Date 07/09/20      PT SHORT TERM GOAL #2   Title Pt will display proper strength, posture, and confidence with RUE to discontinue sling for increase access to independent ADL function.    Baseline Doned sling during all times of day/night besides HEP and grooming. 7/7 Pt currently does not wear the sling in the home during the day per-protocol.    Time 3    Period Weeks    Status Achieved    Target Date 07/09/20               PT Long Term Goals - 11/05/20 1531       PT LONG TERM GOAL #1   Title Pt will improve FOTO score to 57 to measure self reported improvement in functional ADLs    Baseline 38    Time 8    Period Weeks    Status New    Target Date 12/31/20      PT LONG TERM GOAL #2   Title Pt will increase R ankle strength of by at least 1/2 MMT grade in order to demonstrate improvement in strength and function.    Baseline MMT R/L:  Ankle DF: 4/5. Ankle PL: 4/5. Ankle Inversion 4/5. Ankle Eversion: 4/5    Time 8    Period Weeks    Status New    Target Date 12/31/20      PT LONG TERM GOAL #3   Title Pt will improve R ankle AROM equal to L to promote greater access to a normalized gait pattern    Baseline Ankle DF: 13/14. Ankle PL:  20/21. Ankle Inversion: 11/29. Ankle Eversion: 19/31    Time 8    Period Weeks    Status New    Target Date 12/31/20      PT LONG TERM GOAL #4   Title Pt will displayed a more normalized gait pattern with equal step length, and equal WB bilat to promote a greater access to mobility for future travel.    Baseline Antalgic gait of RLE with WB shift to LUE during standing posture to reduce WB. Increased pronation during mid stance. Posture/alignment:  Bilat Pes Planus and  calcaneus valgus that is more dramatic  in WB compared to non-WB    Time 8    Period Weeks    Status New    Target Date 12/31/20      PT LONG TERM GOAL #5   Title Pt will displayed decrease in navicular drop on the R during WB posture to equal L for measurement of greater R posterior tib firing for greater arch stability.    Baseline Navicular drop: 40mm R and 47mm L    Time 8    Period Weeks    Status New    Target Date 12/31/20                   Plan - 11/12/20 1120     Clinical Impression Statement Today's tx was focused on isolated ankle stability in seated and LE stability in standing. Pt states mild-mod R medial ankle fatigue and pain post therex that was reduced to 0/10 post manual intervention and ice. Pt continues to display mark R ankle endurance, strength, and ROM deficits resulting in decreased safe home/community mobiity, increased pain, and limited access to QOL. Pt. will continue to benefit from skilled physical therapy to progress POC to address remaining deficits to facilitate maximum functional capacity for optimal personal health and wellness for ADLs.    Examination-Activity Limitations Hygiene/Grooming;Bathing;Self Feeding    Examination-Participation Restrictions Laundry;Cleaning;Driving;Meal Prep    Stability/Clinical Decision Making Evolving/Moderate complexity    Clinical Decision Making Moderate    Rehab Potential Good    Clinical Impairments Affecting Rehab Potential Negative: chronic nature of pain, comorbidities. Positive: motivated, family support    PT Frequency 2x / week    PT Duration 8 weeks    PT Treatment/Interventions Electrical Stimulation;Fluidtherapy;Moist Heat;Functional mobility training;Therapeutic activities;Therapeutic exercise;Patient/family education;Manual techniques;Scar mobilization;Passive range of motion;Balance training;Neuromuscular re-education;Cryotherapy;DME Instruction;Biofeedback;Traction;Ultrasound;Stair training;Gait  training;Dry needling;Energy conservation;Splinting;Taping;Spinal Manipulations;Joint Manipulations    PT Next Visit Plan Reassess additional [place] and Agree with Plan of Care Patient             Patient will benefit from skilled therapeutic intervention in order to improve the following deficits and impairments:  Decreased endurance, Decreased mobility, Decreased skin integrity, Hypomobility, Impaired sensation, Improper body mechanics, Increased edema, Decreased scar mobility, Decreased range of motion, Decreased activity tolerance, Decreased strength, Impaired UE functional use, Pain, Impaired flexibility, Difficulty walking, Decreased coordination, Decreased balance, Abnormal gait  Visit Diagnosis: Painful gait  Decreased strength, endurance, and mobility  Decreased range of motion of right ankle  Posterior tibial tendinitis of right leg     Problem List Patient Active Problem List   Diagnosis Date Noted   Pancreatic cyst 11/21/2018   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[place]) 09/21/2018   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   Leucopenia 07/06/2018   B12 deficiency 07/06/2018   Fatty liver disease, nonalcoholic 40/98/1191   Onychomycosis 07/20/2017   Hypertensive disorder 12/03/2016   Intracranial aneurysm 12/03/2016   Family history of brain aneurysm 08/07/2016   Obstructive sleep apnea 06/17/2016   Hyperglycemia, unspecified 01/29/2016   Morbid obesity with BMI of 45.0-49.9, adult ([place]) 04/08/2015   Myalgia 11/15/2014   Costochondritis 04/11/2014   Osteopenia 04/11/2014   Prediabetes 02/15/2014   Hypercholesterolemia 09/26/2013   Degenerative arthritis of left knee 06/02/2013   Pura Spice, PT, DPT # 50 Fara Olden, SPT 11/12/2020, 1:55 PM  [place] [facility] [address] [place], [place], [address] Phone: [phone]   Fax:  [phone]  Name:  Darlene Chen MRN: [id number] Date of Birth: 1950/12/26

## 2020-11-14 ENCOUNTER — Ambulatory Visit: Payer: Medicare HMO | Admitting: Physical Therapy

## 2020-11-14 ENCOUNTER — Other Ambulatory Visit: Payer: Self-pay

## 2020-11-14 ENCOUNTER — Encounter: Payer: Self-pay | Admitting: Physical Therapy

## 2020-11-14 DIAGNOSIS — R52 Pain, unspecified: Secondary | ICD-10-CM

## 2020-11-14 DIAGNOSIS — M25671 Stiffness of right ankle, not elsewhere classified: Secondary | ICD-10-CM

## 2020-11-14 DIAGNOSIS — R2689 Other abnormalities of gait and mobility: Secondary | ICD-10-CM | POA: Diagnosis not present

## 2020-11-14 DIAGNOSIS — M76821 Posterior tibial tendinitis, right leg: Secondary | ICD-10-CM

## 2020-11-14 DIAGNOSIS — R6889 Other general symptoms and signs: Secondary | ICD-10-CM

## 2020-11-14 NOTE — Therapy (Signed)
[place] [facility] [address] [place], [place], [address] Phone: [phone]   Fax:  [phone]  Physical Therapy Treatment  Patient Details  Name: Darlene Chen MRN: [id number] Date of Birth: December 19, 1950 Referring Provider (PT): Caroline More DPM   Encounter Date: 11/14/2020   PT End of Session - 11/14/20 0952     Visit Number 4    Number of Visits 16    Date for PT Re-Evaluation 12/31/20    Authorization - Visit Number 4    Authorization - Number of Visits 10    PT Start Time 5956    PT Stop Time 1031    PT Time Calculation (min) 48 min    Activity Tolerance Patient tolerated treatment well;No increased pain    Behavior During Therapy WFL for tasks assessed/performed             Past Medical History:  Diagnosis Date   Diabetes mellitus without complication ([place])    High cholesterol    Hypertension    Neutropenia (HCC)     Past Surgical History:  Procedure Laterality Date   arthroscopic knee surgery  on left 2000     BREAST BIOPSY Right 2019   bx/clip-neg   LEFT HEART CATH AND CORONARY ANGIOGRAPHY N/A 02/09/2017   Procedure: LEFT HEART CATH AND CORONARY ANGIOGRAPHY;  Surgeon: Yolonda Kida, MD;  Location: [place] [facility];  Service: Cardiovascular;  Laterality: N/A;   REDUCTION MAMMAPLASTY Bilateral    1990   SHOULDER SURGERY Right    TUBAL LIGATION  3875   UMBILICAL HERNIA REPAIR  2007    There were no vitals filed for this visit.   Subjective Assessment - 11/14/20 0950     Subjective Pt presents to tx with 0/10 R ankle pain at the start of tx. Pt states no adverse soreness after last tx. Pt states that her knees are bothing her more then her ankles.    Pertinent History Pt would like to avoid surgery. [facility] for foot orthotic. Pt is planning for TKA  of both knees next year.    Limitations House hold activities;Standing;Lifting;Walking    How long can you sit comfortably? WFL    How long  can you stand comfortably? 10-15 mins    How long can you walk comfortably? 7-10 mins    Diagnostic tests MRI: IMPRESSION:  1. Moderate tendinosis of the posterior tibial tendon with a  interstitial tear.  2. 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  3. 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  4. Subcortical extra-articular bone marrow edema and cystic changes  in the lateral calcaneus and lateral talus as can be seen with  posterolateral ankle impingement.  5. Relative pes planus.    Patient Stated Goals Reduce R ankle pain and avoid surgery    Currently in Pain? No/denies    Pain Score 0-No pain                Treatment   Therapeutic Exercise:  NuStep L4 at position 7 with UE assist and verbal cueing to facilitate complete plantigrade position of bilat feet for optimal joint preparing for treatment. 10 mins    Seated position to facilitate ankle strengthening with proper mechanics and loading  of the ankle complex. Verbal and contact cueing to facilitate optimal tissue loading and correct biomechanical alignment to ensure efficient and safe movement. X30 each movement unless otherwise noted.  1) BAPs board with direction of 12,3, 6, 9  o clock  2) PF/DF stretching on pro stretch  with PT OP   Standing in the // bars with occ light touch assist. Verbal and contact cueing to facilitate optimal tissue loading and correct biomechanical alignment to ensure efficient and safe movement.  1) Airex standing with fwd/back and lateral weight shifting 4 mins  2) lateral cone toe taps 3 laps  3) Lateral walking on long airex pad, 3 laps.  4) partial lunge, 3 laps.  5) lateral walking walking for standard flat ground    Manual Therapy 17 mins :  1) Light to moderate STM from distal to proximal with lotion to reduction friction on the the skin. Verbal cueing was use to ensure pressure with tolerated well by the pt.    *ice wrap 10 mins * not  billed*        PT Long Term Goals - 11/05/20 1531       PT LONG TERM GOAL #1   Title Pt will improve FOTO score to 57 to measure self reported improvement in functional ADLs    Baseline 38    Time 8    Period Weeks    Status New    Target Date 12/31/20      PT LONG TERM GOAL #2   Title Pt will increase R ankle strength of by at least 1/2 MMT grade in order to demonstrate improvement in strength and function.    Baseline MMT R/L:  Ankle DF: 4/5. Ankle PL: 4/5. Ankle Inversion 4/5. Ankle Eversion: 4/5    Time 8    Period Weeks    Status New    Target Date 12/31/20      PT LONG TERM GOAL #3   Title Pt will improve R ankle AROM equal to L to promote greater access to a normalized gait pattern    Baseline Ankle DF: 13/14. Ankle PL:  20/21. Ankle Inversion: 11/29. Ankle Eversion: 19/31    Time 8    Period Weeks    Status New    Target Date 12/31/20      PT LONG TERM GOAL #4   Title Pt will displayed a more normalized gait pattern with equal step length, and equal WB bilat to promote a greater access to mobility for future travel.    Baseline Antalgic gait of RLE with WB shift to LUE during standing posture to reduce WB. Increased pronation during mid stance. Posture/alignment:  Bilat Pes Planus and calcaneus valgus that is more dramatic in WB compared to non-WB    Time 8    Period Weeks    Status New    Target Date 12/31/20      PT LONG TERM GOAL #5   Title Pt will displayed decrease in navicular drop on the R during WB posture to equal L for measurement of greater R posterior tib firing for greater arch stability.    Baseline Navicular drop: 29mm R and 75mm L    Time 8    Period Weeks    Status New    Target Date 12/31/20                   Plan - 11/14/20 9798     Clinical Impression Statement Today's tx was focused on progression of LE stability. Pt's tolerance to tx with limited 2/2 bilat knee pain. Pt requires prolonged seated rest breaks 2/2 to bilat knee  pain. Pt continues to display mark R ankle and bilat knee endurance, strength, and  ROM deficits resulting in decreased safe home/community mobiity, increased pain, and limited access to QOL. Pt. will continue to benefit from skilled physical therapy to progress POC to address remaining deficits to facilitate maximum functional capacity for optimal personal health and wellness for ADLs.    Examination-Activity Limitations Hygiene/Grooming;Bathing;Self Feeding    Examination-Participation Restrictions Laundry;Cleaning;Driving;Meal Prep    Stability/Clinical Decision Making Evolving/Moderate complexity    Clinical Decision Making Moderate    Rehab Potential Good    Clinical Impairments Affecting Rehab Potential Negative: chronic nature of pain, comorbidities. Positive: motivated, family support    PT Frequency 2x / week    PT Duration 8 weeks    PT Treatment/Interventions Electrical Stimulation;Fluidtherapy;Moist Heat;Functional mobility training;Therapeutic activities;Therapeutic exercise;Patient/family education;Manual techniques;Scar mobilization;Passive range of motion;Balance training;Neuromuscular re-education;Cryotherapy;DME Instruction;Biofeedback;Traction;Ultrasound;Stair training;Gait training;Dry needling;Energy conservation;Splinting;Taping;Spinal Manipulations;Joint Manipulations    PT Next Visit Plan Progress ankle loading    PT Home Exercise Plan [id number]    Consulted and Agree with Plan of Care Patient             Patient will benefit from skilled therapeutic intervention in order to improve the following deficits and impairments:  Decreased endurance, Decreased mobility, Decreased skin integrity, Hypomobility, Impaired sensation, Improper body mechanics, Increased edema, Decreased scar mobility, Decreased range of motion, Decreased activity tolerance, Decreased strength, Impaired UE functional use, Pain, Impaired flexibility, Difficulty walking, Decreased coordination, Decreased  balance, Abnormal gait  Visit Diagnosis: Painful gait  Decreased strength, endurance, and mobility  Decreased range of motion of right ankle  Posterior tibial tendinitis of right leg     Problem List Patient Active Problem List   Diagnosis Date Noted   Pancreatic cyst 11/21/2018   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   Leucopenia 07/06/2018   B12 deficiency 07/06/2018   Fatty liver disease, nonalcoholic 72/53/6644   Onychomycosis 07/20/2017   Hypertensive disorder 12/03/2016   Intracranial aneurysm 12/03/2016   Family history of brain aneurysm 08/07/2016   Obstructive sleep apnea 06/17/2016   Hyperglycemia, unspecified 01/29/2016   Morbid obesity with BMI of 45.0-49.9, adult ([place]) 04/08/2015   Myalgia 11/15/2014   Costochondritis 04/11/2014   Osteopenia 04/11/2014   Prediabetes 02/15/2014   Hypercholesterolemia 09/26/2013   Degenerative arthritis of left knee 06/02/2013   Pura Spice, PT, DPT # [id number] Fara Olden, SPT 11/14/2020, 1:06 PM  [place] [facility] [address] [place], [place], [address] Phone: [phone]   Fax:  [phone]  Name: Darlene Chen MRN: [id number] Date of Birth: [birth year]

## 2020-11-19 ENCOUNTER — Ambulatory Visit: Payer: Medicare HMO | Admitting: Physical Therapy

## 2020-11-21 ENCOUNTER — Other Ambulatory Visit: Payer: Self-pay

## 2020-11-21 ENCOUNTER — Ambulatory Visit: Payer: Medicare HMO | Admitting: Physical Therapy

## 2020-11-21 ENCOUNTER — Encounter: Payer: Self-pay | Admitting: Physical Therapy

## 2020-11-21 DIAGNOSIS — R52 Pain, unspecified: Secondary | ICD-10-CM

## 2020-11-21 DIAGNOSIS — R2689 Other abnormalities of gait and mobility: Secondary | ICD-10-CM | POA: Diagnosis not present

## 2020-11-21 DIAGNOSIS — M76821 Posterior tibial tendinitis, right leg: Secondary | ICD-10-CM

## 2020-11-21 DIAGNOSIS — R531 Weakness: Secondary | ICD-10-CM

## 2020-11-21 DIAGNOSIS — Z7409 Other reduced mobility: Secondary | ICD-10-CM

## 2020-11-21 DIAGNOSIS — M25671 Stiffness of right ankle, not elsewhere classified: Secondary | ICD-10-CM

## 2020-11-21 NOTE — Therapy (Signed)
[facility] [facility] [address] [place], [place], [address] Phone: [phone]   Fax:  [phone]  Physical Therapy Treatment  Patient Details  Name: Darlene Chen MRN: [id number] Date of Birth: [birth year] Referring Provider (PT): Caroline More DPM   Encounter Date: 11/21/2020   PT End of Session - 11/21/20 1305     Visit Number 5    Number of Visits 16    Date for PT Re-Evaluation 12/31/20    Authorization - Visit Number 5    Authorization - Number of Visits 10    PT Start Time 1572    PT Stop Time 1116    PT Time Calculation (min) 45 min    Activity Tolerance Patient tolerated treatment well;No increased pain    Behavior During Therapy WFL for tasks assessed/performed             Past Medical History:  Diagnosis Date   Diabetes mellitus without complication ([place])    High cholesterol    Hypertension    Neutropenia (HCC)     Past Surgical History:  Procedure Laterality Date   arthroscopic knee surgery  on left 2000     BREAST BIOPSY Right 2019   bx/clip-neg   LEFT HEART CATH AND CORONARY ANGIOGRAPHY N/A 02/09/2017   Procedure: LEFT HEART CATH AND CORONARY ANGIOGRAPHY;  Surgeon: Yolonda Kida, MD;  Location: [place] [facility];  Service: Cardiovascular;  Laterality: N/A;   REDUCTION MAMMAPLASTY Bilateral    1990   SHOULDER SURGERY Right    TUBAL LIGATION  6203   UMBILICAL HERNIA REPAIR  2007    There were no vitals filed for this visit.   Subjective Assessment - 11/21/20 1301     Subjective Pt present to tx with 0/10 R ankle pain. Pt states that she has significant bilat knee pain 7/10 2/2 prolonged time on her feet this morning.    Pertinent History Pt would like to avoid surgery. [facility] for foot orthotic. Pt is planning for TKA  of both knees next year.    Limitations House hold activities;Standing;Lifting;Walking    How long can you sit comfortably? WFL    How long can you stand comfortably?  10-15 mins    How long can you walk comfortably? 7-10 mins    Diagnostic tests MRI: IMPRESSION:  1. Moderate tendinosis of the posterior tibial tendon with a  interstitial tear.  2. 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  3. 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  4. Subcortical extra-articular bone marrow edema and cystic changes  in the lateral calcaneus and lateral talus as can be seen with  posterolateral ankle impingement.  5. Relative pes planus.    Patient Stated Goals Reduce R ankle pain and avoid surgery    Currently in Pain? Yes    Pain Score 7     Pain Location Knee    Pain Orientation Right;Left    Pain Descriptors / Indicators Aching;Sharp                Treatment   Therapeutic Exercise:   Seated therex complete to reduce WB stress on the bilat knees. Verbal and contact cueing to facilitate optimal tissue loading and correct biomechanical alignment to ensure efficient and safe movement.  X 30 each movement   1) LAQ Very pain at end range  2) hip marching 3) pro stretch  4) dyna disc inversion /eversion      Manual Therapy 28 min.:  Seated with knee over the side of the table. General distraction, AP, PA, and rotation mobs to the tibiofemoral joints. Pt did not decrease in pain post mobs with test and retest gait.    Pt was positioned in reclined supine with bolster under bilat knees. Verbal cueing to ensure optimal pressure within pt tolerance.   1) IASTM with thera roller to medial lateral knee, and distal quad. Motion was completed distal to proximal for reduction in swelling. Pt states marked reduction in pain and greater ease with walking.     Ice back to bilat knee and L ankle. Not billed *        PT Short Term Goals - 07/11/20 1520       PT SHORT TERM GOAL #1   Title Pt will begin UE AROM exercises to facilitate independence with light ADLs (Feeding, grooming) per post-surgical protocol.     Baseline Unable to complete AROM. 7/7: Pt is able to feed and wash face with her RUE    Time 3    Period Weeks    Status Achieved    Target Date 07/09/20      PT SHORT TERM GOAL #2   Title Pt will display proper strength, posture, and confidence with RUE to discontinue sling for increase access to independent ADL function.    Baseline Doned sling during all times of day/night besides HEP and grooming. 7/7 Pt currently does not wear the sling in the home during the day per-protocol.    Time 3    Period Weeks    Status Achieved    Target Date 07/09/20               PT Long Term Goals - 11/05/20 1531       PT LONG TERM GOAL #1   Title Pt will improve FOTO score to 57 to measure self reported improvement in functional ADLs    Baseline 38    Time 8    Period Weeks    Status New    Target Date 12/31/20      PT LONG TERM GOAL #2   Title Pt will increase R ankle strength of by at least 1/2 MMT grade in order to demonstrate improvement in strength and function.    Baseline MMT R/L:  Ankle DF: 4/5. Ankle PL: 4/5. Ankle Inversion 4/5. Ankle Eversion: 4/5    Time 8    Period Weeks    Status New    Target Date 12/31/20      PT LONG TERM GOAL #3   Title Pt will improve R ankle AROM equal to L to promote greater access to a normalized gait pattern    Baseline Ankle DF: 13/14. Ankle PL:  20/21. Ankle Inversion: 11/29. Ankle Eversion: 19/31    Time 8    Period Weeks    Status New    Target Date 12/31/20      PT LONG TERM GOAL #4   Title Pt will displayed a more normalized gait pattern with equal step length, and equal WB bilat to promote a greater access to mobility for future travel.    Baseline Antalgic gait of RLE with WB shift to LUE during standing posture to reduce WB. Increased pronation during mid stance. Posture/alignment:  Bilat Pes Planus and calcaneus valgus that is more dramatic in WB compared to non-WB    Time 8    Period Weeks    Status New    Target Date 12/31/20  PT LONG TERM GOAL #5   Title Pt will displayed decrease in navicular drop on the R during WB posture to equal L for measurement of greater R posterior tib firing for greater arch stability.    Baseline Navicular drop: 55mm R and 34mm L    Time 8    Period Weeks    Status New    Target Date 12/31/20                   Plan - 11/21/20 1305     Clinical Impression Statement Today's tx was focused on reduction of bilat knee pain. Joint mobs did not result in decrease pain, however IASTM did result in decrease pain with gait. Pt continues to display mark LE/UE endurance, strength, and ROM deficits resulting in decreased safe home/community mobiity, increased pain, and limited access to QOL. Pt. will continue to benefit from skilled physical therapy to progress POC to address remaining deficits to facilitate maximum functional capacity for optimal personal health and wellness for ADLs.    Examination-Activity Limitations Hygiene/Grooming;Bathing;Self Feeding    Examination-Participation Restrictions Laundry;Cleaning;Driving;Meal Prep    Stability/Clinical Decision Making Evolving/Moderate complexity    Clinical Decision Making Moderate    Rehab Potential Good    Clinical Impairments Affecting Rehab Potential Negative: chronic nature of pain, comorbidities. Positive: motivated, family support    PT Frequency 2x / week    PT Duration 8 weeks    PT Treatment/Interventions Electrical Stimulation;Fluidtherapy;Moist Heat;Functional mobility training;Therapeutic activities;Therapeutic exercise;Patient/family education;Manual techniques;Scar mobilization;Passive range of motion;Balance training;Neuromuscular re-education;Cryotherapy;DME Instruction;Biofeedback;Traction;Ultrasound;Stair training;Gait training;Dry needling;Energy conservation;Splinting;Taping;Spinal Manipulations;Joint Manipulations    PT Next Visit Plan Progress ankle loading    PT Home Exercise Plan [id number]    Consulted and  Agree with Plan of Care Patient             Patient will benefit from skilled therapeutic intervention in order to improve the following deficits and impairments:  Decreased endurance, Decreased mobility, Decreased skin integrity, Hypomobility, Impaired sensation, Improper body mechanics, Increased edema, Decreased scar mobility, Decreased range of motion, Decreased activity tolerance, Decreased strength, Impaired UE functional use, Pain, Impaired flexibility, Difficulty walking, Decreased coordination, Decreased balance, Abnormal gait  Visit Diagnosis: Painful gait  Decreased strength, endurance, and mobility  Decreased range of motion of right ankle  Posterior tibial tendinitis of right leg     Problem List Patient Active Problem List   Diagnosis Date Noted   Pancreatic cyst 11/21/2018   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   Leucopenia 07/06/2018   B12 deficiency 07/06/2018   Fatty liver disease, nonalcoholic 41/63/8453   Onychomycosis 07/20/2017   Hypertensive disorder 12/03/2016   Intracranial aneurysm 12/03/2016   Family history of brain aneurysm 08/07/2016   Obstructive sleep apnea 06/17/2016   Hyperglycemia, unspecified 01/29/2016   Morbid obesity with BMI of 45.0-49.9, adult ([place]) 04/08/2015   Myalgia 11/15/2014   Costochondritis 04/11/2014   Osteopenia 04/11/2014   Prediabetes 02/15/2014   Hypercholesterolemia 09/26/2013   Degenerative arthritis of left knee 06/02/2013   Pura Spice, PT, DPT # [id number] Fara Olden, SPT 11/21/2020, 4:08 PM  [place] [address] [place], [place], [address] Phone: [phone]   Fax:  [phone]  Name: Darlene Chen MRN: [id number] Date of Birth: 24-Oct-1950

## 2020-11-29 ENCOUNTER — Other Ambulatory Visit: Payer: Medicare HMO

## 2020-12-03 ENCOUNTER — Inpatient Hospital Stay: Payer: Medicare HMO | Attending: Oncology

## 2020-12-03 ENCOUNTER — Other Ambulatory Visit: Payer: Self-pay

## 2020-12-03 ENCOUNTER — Ambulatory Visit: Payer: Medicare HMO | Admitting: Physical Therapy

## 2020-12-03 ENCOUNTER — Encounter: Payer: Self-pay | Admitting: Physical Therapy

## 2020-12-03 DIAGNOSIS — M25612 Stiffness of left shoulder, not elsewhere classified: Secondary | ICD-10-CM

## 2020-12-03 DIAGNOSIS — D709 Neutropenia, unspecified: Secondary | ICD-10-CM | POA: Insufficient documentation

## 2020-12-03 DIAGNOSIS — E538 Deficiency of other specified B group vitamins: Secondary | ICD-10-CM

## 2020-12-03 DIAGNOSIS — M76821 Posterior tibial tendinitis, right leg: Secondary | ICD-10-CM

## 2020-12-03 DIAGNOSIS — R52 Pain, unspecified: Secondary | ICD-10-CM

## 2020-12-03 DIAGNOSIS — R2689 Other abnormalities of gait and mobility: Secondary | ICD-10-CM | POA: Diagnosis not present

## 2020-12-03 DIAGNOSIS — M25671 Stiffness of right ankle, not elsewhere classified: Secondary | ICD-10-CM

## 2020-12-03 DIAGNOSIS — R6889 Other general symptoms and signs: Secondary | ICD-10-CM

## 2020-12-03 LAB — CBC WITH DIFFERENTIAL/PLATELET
Abs Immature Granulocytes: 0.08 10*3/uL — ABNORMAL HIGH (ref 0.00–0.07)
Basophils Absolute: 0 10*3/uL (ref 0.0–0.1)
Basophils Relative: 0 %
Eosinophils Absolute: 0 10*3/uL (ref 0.0–0.5)
Eosinophils Relative: 1 %
HCT: 38.5 % (ref 36.0–46.0)
Hemoglobin: 12.6 g/dL (ref 12.0–15.0)
Immature Granulocytes: 3 %
Lymphocytes Relative: 47 %
Lymphs Abs: 1.5 10*3/uL (ref 0.7–4.0)
MCH: 31.4 pg (ref 26.0–34.0)
MCHC: 32.7 g/dL (ref 30.0–36.0)
MCV: 96 fL (ref 80.0–100.0)
Monocytes Absolute: 0.4 10*3/uL (ref 0.1–1.0)
Monocytes Relative: 13 %
Neutro Abs: 1.2 10*3/uL — ABNORMAL LOW (ref 1.7–7.7)
Neutrophils Relative %: 36 %
Platelets: 321 10*3/uL (ref 150–400)
RBC: 4.01 MIL/uL (ref 3.87–5.11)
RDW: 14.3 % (ref 11.5–15.5)
WBC: 3.2 10*3/uL — ABNORMAL LOW (ref 4.0–10.5)
nRBC: 0 % (ref 0.0–0.2)

## 2020-12-03 LAB — VITAMIN B12: Vitamin B-12: 359 pg/mL (ref 180–914)

## 2020-12-03 NOTE — Therapy (Signed)
[facility] [address] [place], [place], [address] Phone: [phone]   Fax:  [phone]  Physical Therapy Treatment  Patient Details  Name: Marionna Gonia MRN: [id number] Date of Birth: 05/23/1950 Referring Provider (PT): Caroline More DPM   Encounter Date: 12/03/2020   PT End of Session - 12/03/20 0949     Visit Number 6    Number of Visits 16    Date for PT Re-Evaluation 12/31/20    Authorization - Visit Number 6    Authorization - Number of Visits 10    PT Start Time 4970    PT Stop Time 2637    PT Time Calculation (min) 43 min    Activity Tolerance Patient tolerated treatment well;No increased pain    Behavior During Therapy WFL for tasks assessed/performed             Past Medical History:  Diagnosis Date   Diabetes mellitus without complication ([place])    High cholesterol    Hypertension    Neutropenia (HCC)     Past Surgical History:  Procedure Laterality Date   arthroscopic knee surgery  on left 2000     BREAST BIOPSY Right 2019   bx/clip-neg   LEFT HEART CATH AND CORONARY ANGIOGRAPHY N/A 02/09/2017   Procedure: LEFT HEART CATH AND CORONARY ANGIOGRAPHY;  Surgeon: Yolonda Kida, MD;  Location: [place] [facility];  Service: Cardiovascular;  Laterality: N/A;   REDUCTION MAMMAPLASTY Bilateral    1990   SHOULDER SURGERY Right    TUBAL LIGATION  8588   UMBILICAL HERNIA REPAIR  2007    There were no vitals filed for this visit.   Subjective Assessment - 12/03/20 0948     Subjective Pt presents to tx with 1/10 R ankle pain. Pt states that she has significant bilat knee pain 6/10 secondary to prolonged time on her feet this morning.  Pt. reports marked benefit  with use of Meloxicam 1x/day for 30 days.  Pt. was fitted for orthotics and will receive next week.    Pertinent History Pt would like to avoid surgery. [facility] for foot orthotic. Pt is planning for TKA  of both knees next year.     Limitations House hold activities;Standing;Lifting;Walking    How long can you sit comfortably? WFL    How long can you stand comfortably? 10-15 mins    How long can you walk comfortably? 7-10 mins    Diagnostic tests MRI: IMPRESSION:  1. Moderate tendinosis of the posterior tibial tendon with a  interstitial tear.  2. 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  3. 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  4. Subcortical extra-articular bone marrow edema and cystic changes  in the lateral calcaneus and lateral talus as can be seen with  posterolateral ankle impingement.  5. Relative pes planus.    Patient Stated Goals Reduce R ankle pain and avoid surgery    Currently in Pain? Yes    Pain Score 6     Pain Location Knee    Pain Orientation Right;Left    Pain Descriptors / Indicators Aching    Pain Type Chronic pain             Treatment    Therapeutic Exercise:  Walking in hallway with gait assessment/ no increase in R ankle pain.   Nustep L4 B UE/LE (warm-up)- discussed standing tolerance (15 minutes is pts. Reported max prior to rest required).   Airex step ups/  walking in //-bars.  Tandem Airex lateral walking in //-bars.  (Painful in B knees).   Tandem gait (forward/ backwards)- light finger touch on //-bars.    Seated therex complete to reduce WB stress on the bilat knees. Verbal and contact cueing to facilitate optimal tissue loading and correct biomechanical alignment to ensure efficient and safe movement.  LAQ 30x with added ankle pumps/ marching 30x.  Quad sets with manual feedback 5x with 10 second holds.    Supine L/R ankle manual isometrics: 5x 10 sec. (All planes)- pain tolerable resistance.       Discussed use of ice to B knees after increase activity/ standing/ walking.      PT Short Term Goals - 07/11/20 1520       PT SHORT TERM GOAL #1   Title Pt will begin UE AROM exercises to facilitate independence with light ADLs  (Feeding, grooming) per post-surgical protocol.    Baseline Unable to complete AROM. 7/7: Pt is able to feed and wash face with her RUE    Time 3    Period Weeks    Status Achieved    Target Date 07/09/20      PT SHORT TERM GOAL #2   Title Pt will display proper strength, posture, and confidence with RUE to discontinue sling for increase access to independent ADL function.    Baseline Doned sling during all times of day/night besides HEP and grooming. 7/7 Pt currently does not wear the sling in the home during the day per-protocol.    Time 3    Period Weeks    Status Achieved    Target Date 07/09/20               PT Long Term Goals - 11/05/20 1531       PT LONG TERM GOAL #1   Title Pt will improve FOTO score to 57 to measure self reported improvement in functional ADLs    Baseline 38    Time 8    Period Weeks    Status New    Target Date 12/31/20      PT LONG TERM GOAL #2   Title Pt will increase R ankle strength of by at least 1/2 MMT grade in order to demonstrate improvement in strength and function.    Baseline MMT R/L:  Ankle DF: 4/5. Ankle PL: 4/5. Ankle Inversion 4/5. Ankle Eversion: 4/5    Time 8    Period Weeks    Status New    Target Date 12/31/20      PT LONG TERM GOAL #3   Title Pt will improve R ankle AROM equal to L to promote greater access to a normalized gait pattern    Baseline Ankle DF: 13/14. Ankle PL:  20/21. Ankle Inversion: 11/29. Ankle Eversion: 19/31    Time 8    Period Weeks    Status New    Target Date 12/31/20      PT LONG TERM GOAL #4   Title Pt will displayed a more normalized gait pattern with equal step length, and equal WB bilat to promote a greater access to mobility for future travel.    Baseline Antalgic gait of RLE with WB shift to LUE during standing posture to reduce WB. Increased pronation during mid stance. Posture/alignment:  Bilat Pes Planus and calcaneus valgus that is more dramatic in WB compared to non-WB    Time 8     Period Weeks    Status New  Target Date 12/31/20      PT LONG TERM GOAL #5   Title Pt will displayed decrease in navicular drop on the R during WB posture to equal L for measurement of greater R posterior tib firing for greater arch stability.    Baseline Navicular drop: 26mm R and 70mm L    Time 8    Period Weeks    Status New    Target Date 12/31/20                   Plan - 12/03/20 1245     Clinical Impression Statement No ankle limitations with dynamic ex./ Airex pads but limited by B knee pain with step ups/ down on Airex.  No ankle swelling present today and good stability with reassessment of MMT.  Pt. has less overall knee pain today due to recent change in medication use (Meloxicam).  Pt. recently fitted for orthotics and will hopefully receive custom orthotics prior to next PT tx. session.  Decrease PT tx. to 1x/week with focus on dynamic ex./ ankle stability to improve pain-free moblity.    Examination-Activity Limitations Hygiene/Grooming;Bathing;Self Feeding    Examination-Participation Restrictions Laundry;Cleaning;Driving;Meal Prep    Stability/Clinical Decision Making Evolving/Moderate complexity    Clinical Decision Making Moderate    Rehab Potential Good    Clinical Impairments Affecting Rehab Potential Negative: chronic nature of pain, comorbidities. Positive: motivated, family support    PT Frequency 2x / week    PT Duration 8 weeks    PT Treatment/Interventions Electrical Stimulation;Fluidtherapy;Moist Heat;Functional mobility training;Therapeutic activities;Therapeutic exercise;Patient/family education;Manual techniques;Scar mobilization;Passive range of motion;Balance training;Neuromuscular re-education;Cryotherapy;DME Instruction;Biofeedback;Traction;Ultrasound;Stair training;Gait training;Dry needling;Energy conservation;Splinting;Taping;Spinal Manipulations;Joint Manipulations    PT Next Visit Plan Progress ankle loading/ dynamic tasks.   Assess new  custom orthotics.    PT Home Exercise Plan [id number]    Consulted and Agree with Plan of Care Patient             Patient will benefit from skilled therapeutic intervention in order to improve the following deficits and impairments:  Decreased endurance, Decreased mobility, Decreased skin integrity, Hypomobility, Impaired sensation, Improper body mechanics, Increased edema, Decreased scar mobility, Decreased range of motion, Decreased activity tolerance, Decreased strength, Impaired UE functional use, Pain, Impaired flexibility, Difficulty walking, Decreased coordination, Decreased balance, Abnormal gait  Visit Diagnosis: Painful gait  Decreased strength, endurance, and mobility  Decreased range of motion of right ankle  Posterior tibial tendinitis of right leg  Decreased ROM of left shoulder     Problem List Patient Active Problem List   Diagnosis Date Noted   Pancreatic cyst 11/21/2018   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[place]) 09/21/2018   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   Leucopenia 07/06/2018   B12 deficiency 07/06/2018   Fatty liver disease, nonalcoholic 25/42/7062   Onychomycosis 07/20/2017   Hypertensive disorder 12/03/2016   Intracranial aneurysm 12/03/2016   Family history of brain aneurysm 08/07/2016   Obstructive sleep apnea 06/17/2016   Hyperglycemia, unspecified 01/29/2016   Morbid obesity with BMI of 45.0-49.9, adult ([place]) 04/08/2015   Myalgia 11/15/2014   Costochondritis 04/11/2014   Osteopenia 04/11/2014   Prediabetes 02/15/2014   Hypercholesterolemia 09/26/2013   Degenerative arthritis of left knee 06/02/2013   Pura Spice, PT, DPT # [phone] 12/03/2020, 12:54 PM  [facility] [address] [place], [place], [address] Phone: [phone]   Fax:  [phone]  Name: Beau Vanduzer MRN: [id number] Date of Birth: [birth year]

## 2020-12-05 ENCOUNTER — Ambulatory Visit: Payer: Medicare HMO | Admitting: Physical Therapy

## 2020-12-10 ENCOUNTER — Ambulatory Visit: Payer: Medicare HMO | Attending: Orthopedic Surgery

## 2020-12-10 ENCOUNTER — Other Ambulatory Visit: Payer: Self-pay

## 2020-12-10 DIAGNOSIS — Z7409 Other reduced mobility: Secondary | ICD-10-CM | POA: Diagnosis present

## 2020-12-10 DIAGNOSIS — R52 Pain, unspecified: Secondary | ICD-10-CM | POA: Diagnosis present

## 2020-12-10 DIAGNOSIS — R531 Weakness: Secondary | ICD-10-CM | POA: Insufficient documentation

## 2020-12-10 DIAGNOSIS — M76821 Posterior tibial tendinitis, right leg: Secondary | ICD-10-CM | POA: Diagnosis present

## 2020-12-10 DIAGNOSIS — R2689 Other abnormalities of gait and mobility: Secondary | ICD-10-CM | POA: Insufficient documentation

## 2020-12-10 DIAGNOSIS — R6889 Other general symptoms and signs: Secondary | ICD-10-CM | POA: Insufficient documentation

## 2020-12-10 DIAGNOSIS — M25612 Stiffness of left shoulder, not elsewhere classified: Secondary | ICD-10-CM | POA: Diagnosis present

## 2020-12-10 DIAGNOSIS — M25671 Stiffness of right ankle, not elsewhere classified: Secondary | ICD-10-CM | POA: Diagnosis present

## 2020-12-10 NOTE — Therapy (Signed)
[facility] [address] [place], [place], [address] Phone: [phone]   Fax:  [phone]  Physical Therapy Treatment  Patient Details  Name: Darlene Chen MRN: [id number] Date of Birth: [birth year] Referring Provider (PT): Caroline More DPM   Encounter Date: 12/10/2020   PT End of Session - 12/10/20 1111     Visit Number 7    Number of Visits 16    Date for PT Re-Evaluation 12/31/20    Authorization - Visit Number 7    Authorization - Number of Visits 10    PT Start Time 5732    PT Stop Time 1100    PT Time Calculation (min) 15 min    Activity Tolerance Patient tolerated treatment well;No increased pain    Behavior During Therapy WFL for tasks assessed/performed             Past Medical History:  Diagnosis Date   Diabetes mellitus without complication ([place])    High cholesterol    Hypertension    Neutropenia (HCC)     Past Surgical History:  Procedure Laterality Date   arthroscopic knee surgery  on left 2000     BREAST BIOPSY Right 2019   bx/clip-neg   LEFT HEART CATH AND CORONARY ANGIOGRAPHY N/A 02/09/2017   Procedure: LEFT HEART CATH AND CORONARY ANGIOGRAPHY;  Surgeon: Yolonda Kida, MD;  Location: [place] [facility];  Service: Cardiovascular;  Laterality: N/A;   REDUCTION MAMMAPLASTY Bilateral    1990   SHOULDER SURGERY Right    TUBAL LIGATION  2025   UMBILICAL HERNIA REPAIR  2007    There were no vitals filed for this visit.   Subjective Assessment - 12/10/20 1110     Subjective Pt reports she did not get a good night's sleep and would like to perform an abbreviated session today.  She has a follow up with her MD at the end of December.    Pertinent History Pt would like to avoid surgery. [facility] for foot orthotic. Pt is planning for TKA  of both knees next year.    Limitations House hold activities;Standing;Lifting;Walking    How long can you sit comfortably? WFL    How long can you  stand comfortably? 10-15 mins    How long can you walk comfortably? 7-10 mins    Diagnostic tests MRI: IMPRESSION:  1. Moderate tendinosis of the posterior tibial tendon with a  interstitial tear.  2. 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  3. 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  4. Subcortical extra-articular bone marrow edema and cystic changes  in the lateral calcaneus and lateral talus as can be seen with  posterolateral ankle impingement.  5. Relative pes planus.    Patient Stated Goals Reduce R ankle pain and avoid surgery               Treatment    Therapeutic Exercise:   Walking in hallway with gait assessment/ no increase in R ankle pain.   Nustep L4 B UE/Lex 10 min, discussed HEP   Airex step ups/ walking in //-bars.  Tandem Airex lateral walking in //-bars.  (Painful in B knees).    Tandem gait (forward/ backwards)- light finger touch on //-bars.     Seated therex complete to reduce WB stress on the bilat knees. Verbal and contact cueing to facilitate optimal tissue loading and correct biomechanical alignment to ensure efficient and safe movement. Marching 30x.    Seated Heel  raises 30x         PT Short Term Goals - 07/11/20 1520       PT SHORT TERM GOAL #1   Title Pt will begin UE AROM exercises to facilitate independence with light ADLs (Feeding, grooming) per post-surgical protocol.    Baseline Unable to complete AROM. 7/7: Pt is able to feed and wash face with her RUE    Time 3    Period Weeks    Status Achieved    Target Date 07/09/20      PT SHORT TERM GOAL #2   Title Pt will display proper strength, posture, and confidence with RUE to discontinue sling for increase access to independent ADL function.    Baseline Doned sling during all times of day/night besides HEP and grooming. 7/7 Pt currently does not wear the sling in the home during the day per-protocol.    Time 3    Period Weeks    Status  Achieved    Target Date 07/09/20               PT Long Term Goals - 11/05/20 1531       PT LONG TERM GOAL #1   Title Pt will improve FOTO score to 57 to measure self reported improvement in functional ADLs    Baseline 38    Time 8    Period Weeks    Status New    Target Date 12/31/20      PT LONG TERM GOAL #2   Title Pt will increase R ankle strength of by at least 1/2 MMT grade in order to demonstrate improvement in strength and function.    Baseline MMT R/L:  Ankle DF: 4/5. Ankle PL: 4/5. Ankle Inversion 4/5. Ankle Eversion: 4/5    Time 8    Period Weeks    Status New    Target Date 12/31/20      PT LONG TERM GOAL #3   Title Pt will improve R ankle AROM equal to L to promote greater access to a normalized gait pattern    Baseline Ankle DF: 13/14. Ankle PL:  20/21. Ankle Inversion: 11/29. Ankle Eversion: 19/31    Time 8    Period Weeks    Status New    Target Date 12/31/20      PT LONG TERM GOAL #4   Title Pt will displayed a more normalized gait pattern with equal step length, and equal WB bilat to promote a greater access to mobility for future travel.    Baseline Antalgic gait of RLE with WB shift to LUE during standing posture to reduce WB. Increased pronation during mid stance. Posture/alignment:  Bilat Pes Planus and calcaneus valgus that is more dramatic in WB compared to non-WB    Time 8    Period Weeks    Status New    Target Date 12/31/20      PT LONG TERM GOAL #5   Title Pt will displayed decrease in navicular drop on the R during WB posture to equal L for measurement of greater R posterior tib firing for greater arch stability.    Baseline Navicular drop: 35mm R and 53mm L    Time 8    Period Weeks    Status New    Target Date 12/31/20                   Plan - 12/10/20 1112     Clinical Impression Statement Performed an abbreviated  PT session today as pt reports she was not feeling up to completing all ther. ex. this morning after not  getting a good night's sleep last night.  She was able to perform dynamic step up exercise onto airex with use of intermittent UE support.  Held off with full therex program; plan to resume at next session.    Examination-Activity Limitations Hygiene/Grooming;Bathing;Self Feeding    Examination-Participation Restrictions Laundry;Cleaning;Driving;Meal Prep    Stability/Clinical Decision Making Evolving/Moderate complexity    Rehab Potential Good    Clinical Impairments Affecting Rehab Potential Negative: chronic nature of pain, comorbidities. Positive: motivated, family support    PT Frequency 2x / week    PT Duration 8 weeks    PT Treatment/Interventions Electrical Stimulation;Fluidtherapy;Moist Heat;Functional mobility training;Therapeutic activities;Therapeutic exercise;Patient/family education;Manual techniques;Scar mobilization;Passive range of motion;Balance training;Neuromuscular re-education;Cryotherapy;DME Instruction;Biofeedback;Traction;Ultrasound;Stair training;Gait training;Dry needling;Energy conservation;Splinting;Taping;Spinal Manipulations;Joint Manipulations    PT Next Visit Plan Progress ankle loading/ dynamic tasks.   Assess new custom orthotics.    PT Home Exercise Plan [id number]    Consulted and Agree with Plan of Care Patient             Patient will benefit from skilled therapeutic intervention in order to improve the following deficits and impairments:  Decreased endurance, Decreased mobility, Decreased skin integrity, Hypomobility, Impaired sensation, Improper body mechanics, Increased edema, Decreased scar mobility, Decreased range of motion, Decreased activity tolerance, Decreased strength, Impaired UE functional use, Pain, Impaired flexibility, Difficulty walking, Decreased coordination, Decreased balance, Abnormal gait  Visit Diagnosis: Painful gait  Decreased strength, endurance, and mobility  Decreased range of motion of right ankle     Problem  List Patient Active Problem List   Diagnosis Date Noted   Pancreatic cyst 11/21/2018   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[place]) 09/21/2018   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   Leucopenia 07/06/2018   B12 deficiency 07/06/2018   Fatty liver disease, nonalcoholic 22/29/7989   Onychomycosis 07/20/2017   Hypertensive disorder 12/03/2016   Intracranial aneurysm 12/03/2016   Family history of brain aneurysm 08/07/2016   Obstructive sleep apnea 06/17/2016   Hyperglycemia, unspecified 01/29/2016   Morbid obesity with BMI of 45.0-49.9, adult ([place]) 04/08/2015   Myalgia 11/15/2014   Costochondritis 04/11/2014   Osteopenia 04/11/2014   Prediabetes 02/15/2014   Hypercholesterolemia 09/26/2013   Degenerative arthritis of left knee 06/02/2013    Pincus Badder, PT 12/10/2020, 11:15 AM Merdis Delay, PT, DPT Physical Therapist - [place]  [facility] [address] [place], [place], [address] Phone: [phone]   Fax:  [phone]  Name: Darlene Chen MRN: [id number] Date of Birth: 1950/03/07

## 2020-12-12 ENCOUNTER — Encounter: Payer: Medicare HMO | Admitting: Physical Therapy

## 2020-12-17 ENCOUNTER — Encounter: Payer: Self-pay | Admitting: Physical Therapy

## 2020-12-17 ENCOUNTER — Other Ambulatory Visit: Payer: Self-pay

## 2020-12-17 ENCOUNTER — Ambulatory Visit: Payer: Medicare HMO | Admitting: Physical Therapy

## 2020-12-17 DIAGNOSIS — M25671 Stiffness of right ankle, not elsewhere classified: Secondary | ICD-10-CM

## 2020-12-17 DIAGNOSIS — M25612 Stiffness of left shoulder, not elsewhere classified: Secondary | ICD-10-CM

## 2020-12-17 DIAGNOSIS — R52 Pain, unspecified: Secondary | ICD-10-CM

## 2020-12-17 DIAGNOSIS — R531 Weakness: Secondary | ICD-10-CM

## 2020-12-17 DIAGNOSIS — R6889 Other general symptoms and signs: Secondary | ICD-10-CM

## 2020-12-17 DIAGNOSIS — R2689 Other abnormalities of gait and mobility: Secondary | ICD-10-CM | POA: Diagnosis not present

## 2020-12-17 DIAGNOSIS — M76821 Posterior tibial tendinitis, right leg: Secondary | ICD-10-CM

## 2020-12-17 NOTE — Therapy (Signed)
[facility] [address] [place], [place], [address] Phone: [phone]   Fax:  [phone]  Physical Therapy Treatment/ DISCHARGE  Patient Details  Name: Darlene Chen MRN: [id number] Date of Birth: 09-25-1950 Referring Provider (PT): Caroline More DPM   Encounter Date: 12/17/2020   PT End of Session - 12/17/20 1033     Visit Number 8    Number of Visits 16    Date for PT Re-Evaluation 12/31/20    Authorization - Visit Number 8    Authorization - Number of Visits 10    PT Start Time 3419    PT Stop Time 3790    PT Time Calculation (min) 26 min    Activity Tolerance Patient tolerated treatment well;No increased pain    Behavior During Therapy WFL for tasks assessed/performed             Past Medical History:  Diagnosis Date   Diabetes mellitus without complication ([place])    High cholesterol    Hypertension    Neutropenia (HCC)     Past Surgical History:  Procedure Laterality Date   arthroscopic knee surgery  on left 2000     BREAST BIOPSY Right 2019   bx/clip-neg   LEFT HEART CATH AND CORONARY ANGIOGRAPHY N/A 02/09/2017   Procedure: LEFT HEART CATH AND CORONARY ANGIOGRAPHY;  Surgeon: Yolonda Kida, MD;  Location: [place] [facility];  Service: Cardiovascular;  Laterality: N/A;   REDUCTION MAMMAPLASTY Bilateral    1990   SHOULDER SURGERY Right    TUBAL LIGATION  2409   UMBILICAL HERNIA REPAIR  2007    There were no vitals filed for this visit.   Subjective Assessment - 12/17/20 1032     Subjective Pt. reports no pain in ankle/foot or shoulder this morning.  Pt. c/o 4/10 knee pain (which is normal).  Pt. planning on L TKA in the spring of 2023.    Pertinent History Pt would like to avoid surgery. [facility] for foot orthotic. Pt is planning for TKA  of both knees next year.    Limitations House hold activities;Standing;Lifting;Walking    How long can you sit comfortably? WFL    How long can you  stand comfortably? 10-15 mins    How long can you walk comfortably? 7-10 mins    Diagnostic tests MRI: IMPRESSION:  1. Moderate tendinosis of the posterior tibial tendon with a  interstitial tear.  2. 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  3. 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  4. Subcortical extra-articular bone marrow edema and cystic changes  in the lateral calcaneus and lateral talus as can be seen with  posterolateral ankle impingement.  5. Relative pes planus.    Patient Stated Goals Reduce R ankle pain and avoid surgery    Currently in Pain? Yes    Pain Score 4     Pain Location Knee    Pain Orientation Right;Left    Pain Descriptors / Indicators Aching             Treatment    Therapeutic Exercise:   Nustep L4 B UE/Lex 10 min.  Standing at stairs: step touches (1st/2nd)/ Airex (slight increase in B hip discomfort)- stopped after 6x.   Walking in hallway 145 feet prior to short seated rest break.  Difficulty with alt. UE/LE due to knee pain/ coordination.  No pain pain.    STS from blue mat table 5x with no UE assist.  Goal reassessment (see goals for ROM/ MMT of R ankle).     Reviewed HEP       PT Short Term Goals - 07/11/20 1520       PT SHORT TERM GOAL #1   Title Pt will begin UE AROM exercises to facilitate independence with light ADLs (Feeding, grooming) per post-surgical protocol.    Baseline Unable to complete AROM. 7/7: Pt is able to feed and wash face with her RUE    Time 3    Period Weeks    Status Achieved    Target Date 07/09/20      PT SHORT TERM GOAL #2   Title Pt will display proper strength, posture, and confidence with RUE to discontinue sling for increase access to independent ADL function.    Baseline Doned sling during all times of day/night besides HEP and grooming. 7/7 Pt currently does not wear the sling in the home during the day per-protocol.    Time 3    Period Weeks    Status  Achieved    Target Date 07/09/20               PT Long Term Goals - 12/17/20 1100       PT LONG TERM GOAL #1   Title Pt will improve FOTO score to 57 to measure self reported improvement in functional ADLs    Baseline 38.  12/13: 65    Time 8    Period Weeks    Status Achieved    Target Date 12/17/20      PT LONG TERM GOAL #2   Title Pt will increase R ankle strength of by at least 1/2 MMT grade in order to demonstrate improvement in strength and function.    Baseline MMT R/L:  Ankle DF: 4/5. Ankle PL: 4/5. Ankle Inversion 4/5. Ankle Eversion: 4/5.  12/12: R ankle 5/5 MMT except PF 4+/5 MMT    Time 8    Period Weeks    Status Achieved    Target Date 12/17/20      PT LONG TERM GOAL #3   Title Pt will improve R ankle AROM equal to L to promote greater access to a normalized gait pattern    Baseline Ankle DF: 13/14. Ankle PL:  20/21. Ankle Inversion: 11/29. Ankle Eversion: 19/31    Time 8    Period Weeks    Status Achieved    Target Date 12/17/20      PT LONG TERM GOAL #4   Title Pt will displayed a more normalized gait pattern with equal step length, and equal WB bilat to promote a greater access to mobility for future travel.    Baseline Antalgic gait of RLE with WB shift to LUE during standing posture to reduce WB. Increased pronation during mid stance. Posture/alignment:  Bilat Pes Planus and calcaneus valgus that is more dramatic in WB compared to non-WB    Time 8    Period Weeks    Status Achieved    Target Date 12/17/20      PT LONG TERM GOAL #5   Title Pt will displayed decrease in navicular drop on the R during WB posture to equal L for measurement of greater R posterior tib firing for greater arch stability.    Baseline Navicular drop: 12mm R and 55mm L    Time 8    Period Weeks    Status Unable to assess    Target Date 12/17/20  Plan - 12/17/20 1033     Clinical Impression Statement Pt. reassessed pts. R ankle AROM/ strength  and PT goals.  Pt. has progressed well with skilled PT over past month.  Pt. understands importance of daily walking/ HEP and instructed to contact PT if any questions or issues.  Discharge from PT at this time.    Examination-Activity Limitations Hygiene/Grooming;Bathing;Self Feeding    Examination-Participation Restrictions Laundry;Cleaning;Driving;Meal Prep    Stability/Clinical Decision Making Evolving/Moderate complexity    Clinical Decision Making Moderate    Rehab Potential Good    Clinical Impairments Affecting Rehab Potential Negative: chronic nature of pain, comorbidities. Positive: motivated, family support    PT Frequency 2x / week    PT Duration 8 weeks    PT Treatment/Interventions Electrical Stimulation;Fluidtherapy;Moist Heat;Functional mobility training;Therapeutic activities;Therapeutic exercise;Patient/family education;Manual techniques;Scar mobilization;Passive range of motion;Balance training;Neuromuscular re-education;Cryotherapy;DME Instruction;Biofeedback;Traction;Ultrasound;Stair training;Gait training;Dry needling;Energy conservation;Splinting;Taping;Spinal Manipulations;Joint Manipulations    PT Next Visit Plan Discharge visit.    PT Home Exercise Plan [id number]    Consulted and Agree with Plan of Care Patient             Patient will benefit from skilled therapeutic intervention in order to improve the following deficits and impairments:  Decreased endurance, Decreased mobility, Decreased skin integrity, Hypomobility, Impaired sensation, Improper body mechanics, Increased edema, Decreased scar mobility, Decreased range of motion, Decreased activity tolerance, Decreased strength, Impaired UE functional use, Pain, Impaired flexibility, Difficulty walking, Decreased coordination, Decreased balance, Abnormal gait  Visit Diagnosis: Painful gait  Decreased strength, endurance, and mobility  Decreased range of motion of right ankle  Posterior tibial tendinitis of  right leg  Decreased ROM of left shoulder     Problem List Patient Active Problem List   Diagnosis Date Noted   Pancreatic cyst 11/21/2018   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[place]) 09/21/2018   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   Leucopenia 07/06/2018   B12 deficiency 07/06/2018   Fatty liver disease, nonalcoholic 00/71/2197   Onychomycosis 07/20/2017   Hypertensive disorder 12/03/2016   Intracranial aneurysm 12/03/2016   Family history of brain aneurysm 08/07/2016   Obstructive sleep apnea 06/17/2016   Hyperglycemia, unspecified 01/29/2016   Morbid obesity with BMI of 45.0-49.9, adult ([place]) 04/08/2015   Myalgia 11/15/2014   Costochondritis 04/11/2014   Osteopenia 04/11/2014   Prediabetes 02/15/2014   Hypercholesterolemia 09/26/2013   Degenerative arthritis of left knee 06/02/2013   Pura Spice, PT, DPT # [phone] 12/17/2020, 11:04 AM  [facility] [address] [place], [place], [address] Phone: [phone]   Fax:  [phone]  Name: Darlene Chen MRN: [id number] Date of Birth: 1950-06-21

## 2020-12-19 ENCOUNTER — Encounter: Payer: Medicare HMO | Admitting: Physical Therapy

## 2021-05-20 ENCOUNTER — Telehealth: Payer: Self-pay | Admitting: Oncology

## 2021-05-20 NOTE — Telephone Encounter (Signed)
Attempt made to reach patient x 2. Vm left to let her know that MRI on 5/18 has been cancelled and that instead patient needs to go to her MRI scheduled on 5/24. Order has been updated to with and without contrast so had to be rescheduled.  ?

## 2021-05-22 ENCOUNTER — Ambulatory Visit: Payer: Medicare HMO

## 2021-05-28 ENCOUNTER — Ambulatory Visit: Payer: Medicare HMO | Admitting: Oncology

## 2021-05-28 ENCOUNTER — Ambulatory Visit
Admission: RE | Admit: 2021-05-28 | Discharge: 2021-05-28 | Disposition: A | Discharge status: Home / Self Care | Payer: Medicare HMO | Source: Ambulatory Visit | Attending: Oncology | Admitting: Oncology

## 2021-05-28 ENCOUNTER — Other Ambulatory Visit: Payer: Medicare HMO

## 2021-05-28 DIAGNOSIS — K862 Cyst of pancreas: Secondary | ICD-10-CM | POA: Insufficient documentation

## 2021-05-28 MED ORDER — GADOBUTROL 1 MMOL/ML IV SOLN
10.0000 mL | Freq: Once | INTRAVENOUS | Status: AC | PRN
  Administered 2021-05-28: 10 mL via INTRAVENOUS

## 2021-05-30 ENCOUNTER — Other Ambulatory Visit: Payer: Self-pay | Admitting: Family Medicine

## 2021-05-30 DIAGNOSIS — N644 Mastodynia: Secondary | ICD-10-CM

## 2021-06-04 ENCOUNTER — Inpatient Hospital Stay: Payer: Medicare HMO | Attending: Oncology

## 2021-06-04 ENCOUNTER — Encounter: Payer: Self-pay | Admitting: Oncology

## 2021-06-04 ENCOUNTER — Other Ambulatory Visit: Payer: Self-pay

## 2021-06-04 ENCOUNTER — Inpatient Hospital Stay (HOSPITAL_BASED_OUTPATIENT_CLINIC_OR_DEPARTMENT_OTHER): Payer: Medicare HMO | Admitting: Oncology

## 2021-06-04 VITALS — Wt 232.7 lb

## 2021-06-04 DIAGNOSIS — K862 Cyst of pancreas: Secondary | ICD-10-CM | POA: Diagnosis not present

## 2021-06-04 DIAGNOSIS — E119 Type 2 diabetes mellitus without complications: Secondary | ICD-10-CM | POA: Insufficient documentation

## 2021-06-04 DIAGNOSIS — Z803 Family history of malignant neoplasm of breast: Secondary | ICD-10-CM | POA: Insufficient documentation

## 2021-06-04 DIAGNOSIS — D709 Neutropenia, unspecified: Secondary | ICD-10-CM | POA: Diagnosis not present

## 2021-06-04 DIAGNOSIS — K7689 Other specified diseases of liver: Secondary | ICD-10-CM | POA: Insufficient documentation

## 2021-06-04 DIAGNOSIS — I10 Essential (primary) hypertension: Secondary | ICD-10-CM | POA: Insufficient documentation

## 2021-06-04 LAB — COMPREHENSIVE METABOLIC PANEL
ALT: 17 U/L (ref 0–44)
AST: 23 U/L (ref 15–41)
Albumin: 3.9 g/dL (ref 3.5–5.0)
Alkaline Phosphatase: 55 U/L (ref 38–126)
Anion gap: 7 (ref 5–15)
BUN: 21 mg/dL (ref 8–23)
CO2: 27 mmol/L (ref 22–32)
Calcium: 9 mg/dL (ref 8.9–10.3)
Chloride: 106 mmol/L (ref 98–111)
Creatinine, Ser: 0.77 mg/dL (ref 0.44–1.00)
GFR, Estimated: >60 mL/min (ref >60)
Glucose, Bld: 130 mg/dL — ABNORMAL HIGH (ref 70–99)
Potassium: 3.9 mmol/L (ref 3.5–5.1)
Sodium: 140 mmol/L (ref 135–145)
Total Bilirubin: 0.5 mg/dL (ref 0.3–1.2)
Total Protein: 7.7 g/dL (ref 6.5–8.1)

## 2021-06-04 LAB — CBC WITH DIFFERENTIAL/PLATELET
Abs Immature Granulocytes: 0.04 10*3/uL (ref 0.00–0.07)
Basophils Absolute: 0 10*3/uL (ref 0.0–0.1)
Basophils Relative: 0 %
Eosinophils Absolute: 0 10*3/uL (ref 0.0–0.5)
Eosinophils Relative: 0 %
HCT: 39.7 % (ref 36.0–46.0)
Hemoglobin: 12.6 g/dL (ref 12.0–15.0)
Immature Granulocytes: 1 %
Lymphocytes Relative: 41 %
Lymphs Abs: 1.2 10*3/uL (ref 0.7–4.0)
MCH: 31.9 pg (ref 26.0–34.0)
MCHC: 31.7 g/dL (ref 30.0–36.0)
MCV: 100.5 fL — ABNORMAL HIGH (ref 80.0–100.0)
Monocytes Absolute: 0.4 10*3/uL (ref 0.1–1.0)
Monocytes Relative: 13 %
Neutro Abs: 1.3 10*3/uL — ABNORMAL LOW (ref 1.7–7.7)
Neutrophils Relative %: 45 %
Platelets: 263 10*3/uL (ref 150–400)
RBC: 3.95 MIL/uL (ref 3.87–5.11)
RDW: 15.5 % (ref 11.5–15.5)
WBC: 3 10*3/uL — ABNORMAL LOW (ref 4.0–10.5)
nRBC: 0 % (ref 0.0–0.2)

## 2021-06-04 NOTE — Progress Notes (Signed)
Patient has no issues

## 2021-06-04 NOTE — Progress Notes (Signed)
Hematology/Oncology Consult note [facility]  [phone] [phone]  Patient Care Team: Sharyne Peach, MD as PCP - General (Family Medicine) Sindy Guadeloupe, MD as Consulting Physician (Oncology)   Name of the patient: Darlene Chen  [id number]  [birth year]   Date of visit: 06/04/21  Diagnosis- 1.  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  Pancreatic cyst  Chief complaint/ Reason for visit-routine follow-up of neutropenia and pancreatic cyst  Heme/Onc history: Patient is a 71 year old female who was referred for neutropenia.  [place] fluctuates between 1.6-1.9 since 2018.  B12 folate HIV hep C normal.   Patient had MRI abdomen done in the past in 2019 and was found to have a 1.7 cm cyst in the pancreatic bodyThis has been subsequently followed and has remained stable without requiring any invasive intervention.  MRI also shows kidney and liver cysts  Interval history-patient is currently doing well and denies any specific complaints at this time.  Denies any recent hospitalizations or infections  ECOG PS- 0 Pain scale- 0   Review of systems- Review of Systems  Constitutional:  Negative for chills, fever, malaise/fatigue and weight loss.  HENT:  Negative for congestion, ear discharge and nosebleeds.   Eyes:  Negative for blurred vision.  Respiratory:  Negative for cough, hemoptysis, sputum production, shortness of breath and wheezing.   Cardiovascular:  Negative for chest pain, palpitations, orthopnea and claudication.  Gastrointestinal:  Negative for abdominal pain, blood in stool, constipation, diarrhea, heartburn, melena, nausea and vomiting.  Genitourinary:  Negative for dysuria, flank pain, frequency, hematuria and urgency.  Musculoskeletal:  Negative for back pain, joint pain and myalgias.  Skin:  Negative for rash.  Neurological:  Negative for dizziness, tingling, focal weakness, seizures, weakness and headaches.   Endo/Heme/Allergies:  Does not bruise/bleed easily.  Psychiatric/Behavioral:  Negative for depression and suicidal ideas. The patient does not have insomnia.      Allergies  Allergen Reactions   Colesevelam Nausea Only   Biaxin [Clarithromycin] Hives   Niacin Other (See Comments)    Burning from inside    Statins Other (See Comments)    Muscle/joint pain.   Sulfa Antibiotics Hives   Sulfur Dioxide    Tramadol Other (See Comments)    Unsure of exact reaction type   Ezetimibe Other (See Comments)    Muscle/joint pain     Past Medical History:  Diagnosis Date   Diabetes mellitus without complication ([place])    High cholesterol    Hypertension    Neutropenia (HCC)    Neutropenia (HCC)      Past Surgical History:  Procedure Laterality Date   arthroscopic knee surgery  on left 2000     BREAST BIOPSY Right 2019   bx/clip-neg   LEFT HEART CATH AND CORONARY ANGIOGRAPHY N/A 02/09/2017   Procedure: LEFT HEART CATH AND CORONARY ANGIOGRAPHY;  Surgeon: Yolonda Kida, MD;  Location: [place] [facility];  Service: Cardiovascular;  Laterality: N/A;   REDUCTION MAMMAPLASTY Bilateral    1990   SHOULDER SURGERY Right    TUBAL LIGATION  4854   UMBILICAL HERNIA REPAIR  2007    Social History   Socioeconomic History   Marital status: Married    Spouse name: Not on file   Number of children: Not on file   Years of education: Not on file   Highest education level: Not on file  Occupational History   Not on file  Tobacco Use   Smoking status: Never  Smokeless tobacco: Never  Vaping Use   Vaping Use: Never used  Substance and Sexual Activity   Alcohol use: No   Drug use: No   Sexual activity: Yes  Other Topics Concern   Not on file  Social History Narrative   Not on file   Social Determinants of Health   Financial Resource Strain: Not on file  Food Insecurity: Not on file  Transportation Needs: Not on file  Physical Activity: Not on file  Stress: Not on file   Social Connections: Not on file  Intimate Partner Violence: Not on file    Family History  Problem Relation Age of Onset   Breast cancer Sister 75     Current Outpatient Medications:    acetaminophen (TYLENOL) 500 MG tablet, Take 1,500 mg by mouth every 6 (six) hours as needed., Disp: , Rfl:    amLODipine (NORVASC) 10 MG tablet, Take 10 mg by mouth daily., Disp: , Rfl:    ammonium lactate (LAC-HYDRIN) 12 % lotion, Apply 1 application. topically as needed., Disp: , Rfl:    cycloSPORINE (RESTASIS) 0.05 % ophthalmic emulsion, Place 1 drop into both eyes 2 (two) times daily as needed. , Disp: , Rfl:    diclofenac sodium (VOLTAREN) 1 % GEL, Apply 1 application topically as needed., Disp: , Rfl:    Evolocumab with Infusor ([place]) 420 MG/3.5ML SOCT, Inject 1 Dose into the skin every 30 (thirty) days., Disp: , Rfl:    fluocinonide cream (LIDEX) 9.48 %, Apply 1 application topically 2 (two) times daily as needed. For eczema, Disp: , Rfl: 0   hydrochlorothiazide (HYDRODIURIL) 12.5 MG tablet, Take 12.5 mg by mouth daily., Disp: , Rfl:    metFORMIN (GLUCOPHAGE) 500 MG tablet, Take 500 mg by mouth 2 (two) times daily., Disp: , Rfl:    metoprolol succinate (TOPROL-XL) 25 MG 24 hr tablet, Take 25 mg by mouth daily., Disp: , Rfl:    tiZANidine (ZANAFLEX) 4 MG tablet, Take 4 mg by mouth 3 (three) times daily as needed., Disp: , Rfl:    LORazepam (ATIVAN) 0.5 MG tablet, Take 1 tab 15-20 minutes prior to procedure. (Patient not taking: Reported on 06/04/2021), Disp: , Rfl:   Physical exam:  Vitals:   06/04/21 1014  Weight: 232 lb 11.2 oz (105.6 kg)   Physical Exam Constitutional:      General: She is not in acute distress. Cardiovascular:     Rate and Rhythm: Normal rate and regular rhythm.     Heart sounds: Normal heart sounds.  Pulmonary:     Effort: Pulmonary effort is normal.     Breath sounds: Normal breath sounds.  Abdominal:     General: Bowel sounds are normal.      Palpations: Abdomen is soft.  Skin:    General: Skin is warm and dry.  Neurological:     Mental Status: She is alert and oriented to person, place, and time.        Latest Ref Rng & Units 06/04/2021    9:53 AM  CMP  Glucose 70 - 99 mg/dL 130    BUN 8 - 23 mg/dL 21    Creatinine 0.44 - 1.00 mg/dL 0.77    Sodium 135 - 145 mmol/L 140    Potassium 3.5 - 5.1 mmol/L 3.9    Chloride 98 - 111 mmol/L 106    CO2 22 - 32 mmol/L 27    Calcium 8.9 - 10.3 mg/dL 9.0    Total Protein 6.5 -  8.1 g/dL 7.7    Total Bilirubin 0.3 - 1.2 mg/dL 0.5    Alkaline Phos 38 - 126 U/L 55    AST 15 - 41 U/L 23    ALT 0 - 44 U/L 17        Latest Ref Rng & Units 06/04/2021    9:53 AM  CBC  WBC 4.0 - 10.5 K/uL 3.0    Hemoglobin 12.0 - 15.0 g/dL 12.6    Hematocrit 36.0 - 46.0 % 39.7    Platelets 150 - 400 K/uL 263      No images are attached to the encounter.  MR Abdomen W Wo Contrast  Result Date: 05/28/2021 CLINICAL DATA:  Pancreatic lesions follow-up EXAM: MRI ABDOMEN WITHOUT AND WITH CONTRAST TECHNIQUE: Multiplanar multisequence MR imaging of the abdomen was performed both before and after the administration of intravenous contrast. CONTRAST:  87m GADAVIST GADOBUTROL 1 MMOL/ML IV SOLN COMPARISON:  MRI abdomen 05/23/2020 FINDINGS: Lower chest: No acute findings.  Cardiomegaly. Hepatobiliary: Liver is normal in size and contour with no suspicious mass identified. Several hepatic cysts are again seen measuring up to 1.1 cm in the posterior right lobe. Gallbladder appears normal. No biliary ductal dilatation. Pancreas: Multiple pancreatic cystic lesions are again seen and appears stable in size. Largest is at the body of the pancreas and is bilobed with partial septation measuring up to 1.6 x 1.6 cm, stable since MRI in 2019. Largest in the tail measures 0.6 cm. A few of the lesions demonstrate communication with the main pancreatic duct. No suspicious mural enhancement identified within the cysts. Main pancreatic  duct is normal caliber. Spleen:  Within normal limits in size and appearance. Adrenals/Urinary Tract: Stable 1.2 cm left adrenal gland adenoma, no follow-up required. A few small renal cortical cysts are visualized with no suspicious enhancing renal mass identified. No hydronephrosis. Stomach/Bowel: Colonic diverticulosis. No bowel obstruction visualized. Vascular/Lymphatic: No pathologically enlarged lymph nodes identified. No abdominal aortic aneurysm demonstrated. Other:  No ascites. Musculoskeletal: No suspicious bone lesions identified. IMPRESSION: 1. 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 Stable since 2019. Recommend continued follow-up with MRI in 1 year, then every 2 years x2 if stable. 2. Small hepatic cysts. 3. Small renal cysts. 4. Colonic diverticulosis. Electronically Signed   By: DOfilia NeasM.D.   On: 05/28/2021 10:11     Assessment and plan- Patient is a 71y.o. female who is here for follow-up of following issues:   Chronic neutropenia: Patient CBC shows a white count that fluctuates between 2.5-3.5.  Presently it is 3 with an AMarquetteof 1.3 and has remained stable around this range at least since June 2020.  No other cytopenias.  Hemoglobin stable around 12 with a normal platelet count.  This can be monitored conservatively and I will see her back in 1 year with a repeat CBC with differential.  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  Her present MRI again shows multiple pancreatic cysts stable since 2019.  Also has small kidney and liver cysts.  We will repeat an MRI in 1 year     Visit Diagnosis 1. 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[place]   2. Pancreatic cyst      Dr. ARanda Evens MD, MPH [id number]/31/2023 2:34 PM

## 2021-06-18 ENCOUNTER — Ambulatory Visit
Admission: RE | Admit: 2021-06-18 | Discharge: 2021-06-18 | Disposition: A | Discharge status: Home / Self Care | Payer: Medicare HMO | Source: Ambulatory Visit | Attending: Family Medicine | Admitting: Family Medicine

## 2021-06-18 DIAGNOSIS — N644 Mastodynia: Secondary | ICD-10-CM | POA: Diagnosis present

## 2021-07-18 ENCOUNTER — Other Ambulatory Visit: Payer: Self-pay | Admitting: Orthopedic Surgery

## 2021-07-18 DIAGNOSIS — M19012 Primary osteoarthritis, left shoulder: Secondary | ICD-10-CM

## 2021-07-22 ENCOUNTER — Ambulatory Visit
Admission: RE | Admit: 2021-07-22 | Discharge: 2021-07-22 | Disposition: A | Discharge status: Home / Self Care | Payer: Medicare HMO | Source: Ambulatory Visit | Attending: Orthopedic Surgery | Admitting: Orthopedic Surgery

## 2021-07-22 DIAGNOSIS — M19012 Primary osteoarthritis, left shoulder: Secondary | ICD-10-CM | POA: Insufficient documentation

## 2021-11-12 ENCOUNTER — Ambulatory Visit: Payer: Medicare HMO | Admitting: Physical Therapy

## 2021-11-19 ENCOUNTER — Ambulatory Visit: Payer: Medicare HMO | Attending: Nurse Practitioner | Admitting: Physical Therapy

## 2021-11-19 DIAGNOSIS — R2689 Other abnormalities of gait and mobility: Secondary | ICD-10-CM | POA: Diagnosis present

## 2021-11-19 DIAGNOSIS — M25512 Pain in left shoulder: Secondary | ICD-10-CM | POA: Insufficient documentation

## 2021-11-19 DIAGNOSIS — R52 Pain, unspecified: Secondary | ICD-10-CM | POA: Diagnosis present

## 2021-11-19 DIAGNOSIS — M6281 Muscle weakness (generalized): Secondary | ICD-10-CM | POA: Insufficient documentation

## 2021-11-19 DIAGNOSIS — R531 Weakness: Secondary | ICD-10-CM | POA: Diagnosis present

## 2021-11-19 DIAGNOSIS — M25612 Stiffness of left shoulder, not elsewhere classified: Secondary | ICD-10-CM | POA: Insufficient documentation

## 2021-11-19 DIAGNOSIS — R6889 Other general symptoms and signs: Secondary | ICD-10-CM | POA: Insufficient documentation

## 2021-11-19 DIAGNOSIS — Z7409 Other reduced mobility: Secondary | ICD-10-CM | POA: Insufficient documentation

## 2021-11-19 DIAGNOSIS — R269 Unspecified abnormalities of gait and mobility: Secondary | ICD-10-CM | POA: Insufficient documentation

## 2021-11-19 DIAGNOSIS — G8929 Other chronic pain: Secondary | ICD-10-CM | POA: Insufficient documentation

## 2021-11-21 ENCOUNTER — Ambulatory Visit: Payer: Medicare HMO | Admitting: Physical Therapy

## 2021-11-21 DIAGNOSIS — M6281 Muscle weakness (generalized): Secondary | ICD-10-CM

## 2021-11-21 DIAGNOSIS — R2689 Other abnormalities of gait and mobility: Secondary | ICD-10-CM

## 2021-11-21 DIAGNOSIS — R269 Unspecified abnormalities of gait and mobility: Secondary | ICD-10-CM

## 2021-11-21 DIAGNOSIS — M25512 Pain in left shoulder: Secondary | ICD-10-CM | POA: Diagnosis not present

## 2021-11-21 DIAGNOSIS — G8929 Other chronic pain: Secondary | ICD-10-CM

## 2021-11-21 DIAGNOSIS — M25612 Stiffness of left shoulder, not elsewhere classified: Secondary | ICD-10-CM

## 2021-11-22 NOTE — Therapy (Addendum)
OUTPATIENT PHYSICAL THERAPY SHOULDER EVALUATION   Patient Name: Darlene Chen MRN: [id number] DOB:11-28-1950, 71 y.o., female Today's Date: 11/19/2021  END OF SESSION:  PT End of Session - 11/22/21 1935     Visit Number 1    Number of Visits 16    Date for PT Re-Evaluation 01/14/22    PT Start Time 1118    PT Stop Time 1205    PT Time Calculation (min) 47 min             Past Medical History:  Diagnosis Date   Diabetes mellitus without complication ([place])    High cholesterol    Hypertension    Neutropenia (HCC)    Neutropenia (HCC)    Past Surgical History:  Procedure Laterality Date   arthroscopic knee surgery  on left 2000     BREAST BIOPSY Right 2019   bx/clip-neg   LEFT HEART CATH AND CORONARY ANGIOGRAPHY N/A 02/09/2017   Procedure: LEFT HEART CATH AND CORONARY ANGIOGRAPHY;  Surgeon: Yolonda Kida, MD;  Location: [facility];  Service: Cardiovascular;  Laterality: N/A;   REDUCTION MAMMAPLASTY Bilateral    1990   SHOULDER SURGERY Right    TUBAL LIGATION  8850   UMBILICAL HERNIA REPAIR  2007   Patient Active Problem List   Diagnosis Date Noted   Pancreatic cyst 11/21/2018   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[place]) 09/21/2018   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   Leucopenia 07/06/2018   B12 deficiency 07/06/2018   Fatty liver disease, nonalcoholic 27/74/1287   Onychomycosis 07/20/2017   Hypertensive disorder 12/03/2016   Intracranial aneurysm 12/03/2016   Family history of brain aneurysm 08/07/2016   Obstructive sleep apnea 06/17/2016   Hyperglycemia, unspecified 01/29/2016   Morbid obesity with BMI of 45.0-49.9, adult ([place]) 04/08/2015   Myalgia 11/15/2014   Costochondritis 04/11/2014   Osteopenia 04/11/2014   Prediabetes 02/15/2014   Hypercholesterolemia 09/26/2013   Degenerative arthritis of left knee 06/02/2013    PCP: Sharyne Peach, MD  REFERRING PROVIDER: Cheryl Flash, MD; Greggory Stallion Deep, NP  REFERRING DIAG:  Rotator cuff arthropathy of left shoulder     Fear of falling     Gait abnormality  THERAPY DIAG:  Chronic left shoulder pain  Shoulder joint stiffness, left  Muscle weakness (generalized)  Gait difficulty  Imbalance  Rationale for Evaluation and Treatment: Rehabilitation  ONSET DATE: Chronic  SUBJECTIVE:                                                                                                                                                                                      SUBJECTIVE STATEMENT: Pt. Referred to PT for chronic L shoulder pain  by Dr. Earlie Counts and for gait/ balance issues by Greggory Stallion Deep, NP.  Pt. Is planning for L shoulder replacement due to severity of L shoulder pain/ limitations with daily movement.  Pt. Know well to PT clinic due to R shoulder replacement/ balance/ gait and ankle issues in past.  Pt. Reports >10/10 L shoulder pain with reaching/ sudden movements.  Pt. Is R hand dominant.  Pt. States making bed is really difficulty.  No L shoulder pain at rest.  Pt. Is a R side sleeper.  Pt. Reports no falls but scared of the result of a fall.  Fearful of step ladder.    PERTINENT HISTORY: See MD notes.  Pt. Known well to PT clinic.  MD appt. For L shoulder on 01/29/22.    PAIN:  Are you having pain? Yes: NPRS scale: 10/10 Pain location: L shoulder Pain description: sharp Aggravating factors: reaching/ sudden movements Relieving factors: rest  PRECAUTIONS: Fall  WEIGHT BEARING RESTRICTIONS: No  FALLS:  Has patient fallen in last 6 months? No  LIVING ENVIRONMENT: Lives with: lives with their spouse Lives in: House/apartment Has following equipment at home: Single point cane  OCCUPATION: Retired.    PLOF: Independent  PATIENT GOALS:  Increase L shoulder ROM/ strength (prepare for shoulder replacement) and improve gait/balance.   NEXT MD VISIT: 01/29/22  OBJECTIVE:   DIAGNOSTIC FINDINGS:  Bones/Joint/Cartilage   No fracture or  dislocation. Normal alignment. Small joint effusion.   Severe osteoarthritis of the glenohumeral joint with severe joint space narrowing, a bone-on-bone appearance, subchondral sclerosis, subchondral cystic changes and marginal osteophytosis.   Mild arthropathy of the acromioclavicular joint.   Ligaments   Ligaments are suboptimally evaluated by CT.   Muscles and Tendons No significant muscle atrophy. No intramuscular fluid collection or hematoma. Rotator cuff is suboptimally evaluated by CT without intra-articular contrast.   Soft tissue No fluid collection or hematoma. No soft tissue mass. Visualized portions of the lung are clear. Thoracic aortic atherosclerosis.   IMPRESSION: 1. Severe osteoarthritis of the left glenohumeral joint. 2. Aortic Atherosclerosis (ICD10-I70.0).    PATIENT SURVEYS:  FOTO initial 41/ goal 25  COGNITION: Overall cognitive status: Within functional limits for tasks assessed     SENSATION: WFL  POSTURE: Rounder shoulder/ forward posture in sitting and standing  UPPER EXTREMITY ROM:   Active ROM Right eval Left eval  Shoulder flexion 134 deg 84 deg.  Shoulder extension    Shoulder abduction 132 deg. 70 deg.  Shoulder adduction    Shoulder internal rotation 74 deg. 52 deg.  Shoulder external rotation 70 deg. 49 deg.  Elbow flexion [facility] WFL  Elbow extension [facility]  Wrist flexion [facility] WFL  Wrist extension [facility] WFL  Wrist ulnar deviation    Wrist radial deviation    Wrist pronation    Wrist supination    (Blank rows = not tested)  UPPER EXTREMITY MMT:  MMT Right eval Left eval  Shoulder flexion 4 3  Shoulder extension 4+   Shoulder abduction 4 3  Shoulder adduction    Shoulder internal rotation 4+ 3  Shoulder external rotation 4 3  Middle trapezius    Lower trapezius    Elbow flexion 5 4  Elbow extension 5 4  Wrist flexion    Wrist extension    Wrist ulnar deviation    Wrist radial deviation    Wrist pronation    Wrist  supination    Grip strength (lbs)    (Blank rows = not tested)  B LE AROM WFL and strength grossly 5/5 MMT except hip flexion/abduction 4/5 MMT.    SHOULDER SPECIAL TESTS: Unable to perform to L shoulder due to limitations in ROM and severe pain.    JOINT MOBILITY TESTING:  Significant L shoulder crepitus/ bony end-feel.   Catching/ locking noted in L shoulder with PROM.   PALPATION:  Minimal L shoulder tenderness with palpation.  L UT muscle tightness.   Berg balance test: 49/56  (discussed use of SPC/ stairs)   TODAY'S TREATMENT:                                                                                                                                         DATE: 11/19/21   PATIENT EDUCATION: Education details: L shoulder HEP/ swim lessons Person educated: Patient Education method: Customer service manager Education comprehension: verbalized understanding and returned demonstration  HOME EXERCISE PROGRAM: Pulley/ wand ex.  ASSESSMENT:  CLINICAL IMPRESSION: Patient is a pleasant 71 y.o. female who was seen today for physical therapy evaluation and treatment for L shoulder pain and gait/balance issues.   Pt. Presents to PT with significant limitations in L shoulder ROM/ strength due to pain/ joint limitations.  Pt. Presents with knee pain and antalgic gait with fall risk noted.  Pt. Has marked increase in knee pain with ascending/descending stairs.  Pt. Descends stairs backwards due to pain.  Pt. Will benefit from skilled PT services to develop HEP to increase L shoulder ROM/ strength in preparation for shoulder replacement.  Pt. Will also benefit from LE strength training/ balance tasks to improve safety/ decrease fall risk with daily mobility.    OBJECTIVE IMPAIRMENTS: Abnormal gait, cardiopulmonary status limiting activity, decreased activity tolerance, decreased balance, decreased endurance, decreased mobility, difficulty walking, decreased ROM, decreased strength,  decreased safety awareness, hypomobility, impaired flexibility, improper body mechanics, postural dysfunction, obesity, and pain.   ACTIVITY LIMITATIONS: carrying, lifting, bending, standing, squatting, sleeping, stairs, transfers, bed mobility, reach over head, and locomotion level  PARTICIPATION LIMITATIONS: meal prep, cleaning, laundry, driving, and community activity  PERSONAL FACTORS: Age, Past/current experiences, and 3+ comorbidities: see above  are also affecting patient's functional outcome.   REHAB POTENTIAL: Good  CLINICAL DECISION MAKING: Evolving/moderate complexity  EVALUATION COMPLEXITY: Moderate   GOALS: Goals reviewed with patient? Yes  SHORT TERM GOALS: Target date: 12/17/21  Pt. Will be independent with HEP to increase L shoulder AROM to >90 deg. Flexion to improve ADLs. Baseline:  see above Goal status: INITIAL  2.  Pt. Will demonstrate ability to descend stairs forward with UE assist on handrail and good knee flexion/ quad control for safety.   Baseline:  descend stairs backwards with heavy UE assist Goal status: INITIAL   LONG TERM GOALS: Target date: 01/14/22  Pt. Will increase FOTO to 62 to improve functional mobility.  Baseline: initial 42 Goal status: INITIAL  2.  Pt. Will increase Berg balance test  to >52 to improve safety/ decrease fall risk with community mobility.  Baseline: 49/56 (discussed use of SPC with outside walking) Goal status: INITIAL  3.  Pt. Will increase L shoulder strength 1/2 muscle grade to prepare for upcoming shoulder replacement/ improve reaching tasks.  Baseline:  see above Goal status: INITIAL  4.  Pt. Able to complete 30 minute exercise program with no increase c/o pain.   Baseline: pt. Has swimming lessons.  Currently not participating with ex. Program.   Goal status: INITIAL  PLAN:  PT FREQUENCY: 1-2x/week  PT DURATION: 8 weeks  PLANNED INTERVENTIONS: Therapeutic exercises, Therapeutic activity, Neuromuscular  re-education, Balance training, Gait training, Patient/Family education, Self Care, Joint mobilization, Stair training, Electrical stimulation, Cryotherapy, Moist heat, Manual therapy, and Re-evaluation  PLAN FOR NEXT SESSION: Reassess LE strength/ balance tasks.  Progress L shoulder ROM   Pura Spice, PT 11/22/2021, 7:38 PM

## 2021-11-22 NOTE — Therapy (Addendum)
OUTPATIENT PHYSICAL THERAPY SHOULDER TREATMENT   Patient Name: Darlene Chen MRN: [id number] DOB:[birth year], 71 y.o., female Today's Date: 11/21/2021  END OF SESSION:  PT End of Session - 11/22/21 1939     Visit Number 2    Number of Visits 16    Date for PT Re-Evaluation 01/14/22    PT Start Time 1059    PT Stop Time 1155    PT Time Calculation (min) 56 min             Past Medical History:  Diagnosis Date   Diabetes mellitus without complication ([place])    High cholesterol    Hypertension    Neutropenia (HCC)    Neutropenia (HCC)    Past Surgical History:  Procedure Laterality Date   arthroscopic knee surgery  on left 2000     BREAST BIOPSY Right 2019   bx/clip-neg   LEFT HEART CATH AND CORONARY ANGIOGRAPHY N/A 02/09/2017   Procedure: LEFT HEART CATH AND CORONARY ANGIOGRAPHY;  Surgeon: Yolonda Kida, MD;  Location: [facility];  Service: Cardiovascular;  Laterality: N/A;   REDUCTION MAMMAPLASTY Bilateral    1990   SHOULDER SURGERY Right    TUBAL LIGATION  2585   UMBILICAL HERNIA REPAIR  2007   Patient Active Problem List   Diagnosis Date Noted   Pancreatic cyst 11/21/2018   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[place]) 09/21/2018   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   Leucopenia 07/06/2018   B12 deficiency 07/06/2018   Fatty liver disease, nonalcoholic 27/78/2423   Onychomycosis 07/20/2017   Hypertensive disorder 12/03/2016   Intracranial aneurysm 12/03/2016   Family history of brain aneurysm 08/07/2016   Obstructive sleep apnea 06/17/2016   Hyperglycemia, unspecified 01/29/2016   Morbid obesity with BMI of 45.0-49.9, adult ([place]) 04/08/2015   Myalgia 11/15/2014   Costochondritis 04/11/2014   Osteopenia 04/11/2014   Prediabetes 02/15/2014   Hypercholesterolemia 09/26/2013   Degenerative arthritis of left knee 06/02/2013    PCP: Sharyne Peach, MD   REFERRING PROVIDER: Cheryl Flash, MD; Greggory Stallion Deep, NP   REFERRING DIAG:  Rotator cuff arthropathy of left shoulder                          Fear of falling                                   Gait abnormality   THERAPY DIAG:  Chronic left shoulder pain   Shoulder joint stiffness, left   Muscle weakness (generalized)   Gait difficulty   Imbalance   Rationale for Evaluation and Treatment: Rehabilitation   ONSET DATE: Chronic   SUBJECTIVE:  SUBJECTIVE STATEMENT:   EVALUATION Pt. Referred to PT for chronic L shoulder pain by Dr. Earlie Counts and for gait/ balance issues by [employer], Nab Deep, NP.  Pt. Is planning for L shoulder replacement due to severity of L shoulder pain/ limitations with daily movement.  Pt. Know well to PT clinic due to R shoulder replacement/ balance/ gait and ankle issues in past.  Pt. Reports >10/10 L shoulder pain with reaching/ sudden movements.  Pt. Is R hand dominant.  Pt. States making bed is really difficulty.  No L shoulder pain at rest.  Pt. Is a R side sleeper.  Pt. Reports no falls but scared of the result of a fall.  Fearful of step ladder.     PERTINENT HISTORY: See MD notes.  Pt. Known well to PT clinic.  MD appt. For L shoulder on 01/29/22.     PAIN:  Are you having pain? Yes: NPRS scale: 10/10 Pain location: L shoulder Pain description: sharp Aggravating factors: reaching/ sudden movements Relieving factors: rest   PRECAUTIONS: Fall   WEIGHT BEARING RESTRICTIONS: No   FALLS:  Has patient fallen in last 6 months? No   LIVING ENVIRONMENT: Lives with: lives with their spouse Lives in: House/apartment Has following equipment at home: Single point cane   OCCUPATION: Retired.     PLOF: Independent   PATIENT GOALS:  Increase L shoulder ROM/ strength (prepare for shoulder replacement) and improve gait/balance.    NEXT MD VISIT: 01/29/22    OBJECTIVE:    DIAGNOSTIC FINDINGS:  Bones/Joint/Cartilage   No fracture or dislocation. Normal alignment. Small joint effusion.   Severe osteoarthritis of the glenohumeral joint with severe joint space narrowing, a bone-on-bone appearance, subchondral sclerosis, subchondral cystic changes and marginal osteophytosis.   Mild arthropathy of the acromioclavicular joint.   Ligaments   Ligaments are suboptimally evaluated by CT.   Muscles and Tendons No significant muscle atrophy. No intramuscular fluid collection or hematoma. Rotator cuff is suboptimally evaluated by CT without intra-articular contrast.   Soft tissue No fluid collection or hematoma. No soft tissue mass. Visualized portions of the lung are clear. Thoracic aortic atherosclerosis.   IMPRESSION: 1. Severe osteoarthritis of the left glenohumeral joint. 2. Aortic Atherosclerosis (ICD10-I70.0).     PATIENT SURVEYS:  FOTO initial 41/ goal 50   COGNITION: Overall cognitive status: Within functional limits for tasks assessed                                  SENSATION: WFL   POSTURE: Rounder shoulder/ forward posture in sitting and standing   UPPER EXTREMITY ROM:    Active ROM Right eval Left eval  Shoulder flexion 134 deg 84 deg.  Shoulder extension      Shoulder abduction 132 deg. 70 deg.  Shoulder adduction      Shoulder internal rotation 74 deg. 52 deg.  Shoulder external rotation 70 deg. 49 deg.  Elbow flexion [facility] WFL  Elbow extension [facility]  Wrist flexion [facility] WFL  Wrist extension [facility] WFL  Wrist ulnar deviation      Wrist radial deviation      Wrist pronation      Wrist supination      (Blank rows = not tested)   UPPER EXTREMITY MMT:   MMT Right eval Left eval  Shoulder flexion 4 3  Shoulder extension 4+    Shoulder abduction 4 3  Shoulder adduction  Shoulder internal rotation 4+ 3  Shoulder external rotation 4 3  Middle trapezius      Lower trapezius      Elbow flexion 5 4   Elbow extension 5 4  Wrist flexion      Wrist extension      Wrist ulnar deviation      Wrist radial deviation      Wrist pronation      Wrist supination      Grip strength (lbs)      (Blank rows = not tested)   B LE AROM WFL and strength grossly 5/5 MMT except hip flexion/abduction 4/5 MMT.     SHOULDER SPECIAL TESTS: Unable to perform to L shoulder due to limitations in ROM and severe pain.     JOINT MOBILITY TESTING:  Significant L shoulder crepitus/ bony end-feel.   Catching/ locking noted in L shoulder with PROM.    PALPATION:  Minimal L shoulder tenderness with palpation.  L UT muscle tightness.    Berg balance test: 49/56  (discussed use of SPC/ stairs)             TODAY'S TREATMENT:                                                                                                                          DATE: 11/21/21  Subjective:  Pt. Reports no pain currently at rest but severe pain with L shoulder ROM/ reaching.  No falls.    Neuro:  Reassessment of Berg: 6" step touches in //-bars with light to no UE assist.  Turning/ wt. Shifting without UE assist.    Walking in hallway with use of SPC and instruction in proper 2-point gait pattern/ step pattern/ BOS  There.ex.:  Nustep L3 10 min. B UE/LE 10 min.    Seated B shoulder wand ex. [place] with PT assist on L): chest press/ shoulder flexion/ bicep curls 20x.  Standing shoulder extension/ IR.    //-bars: marching/ lateral walking with upright posture.    STM to B UT/ neck in seated posture after tx.  Marked L UT muscle tightness      PATIENT EDUCATION: Education details: L shoulder HEP/ swim lessons Person educated: Patient Education method: Customer service manager Education comprehension: verbalized understanding and returned demonstration   HOME EXERCISE PROGRAM: Pulley/ wand ex.   ASSESSMENT:   CLINICAL IMPRESSION: Pt. Works hard during PT tx. Session.  Pt. Limited in knee with standing tasks/  step touches.  Significant L shoulder crepitus/ catching noted during seated wand ex. With PT assist.  Pt. Benefits from use of SPC with 2-point gait pattern and consistent step pattern.  No LOB during //-bar ex. And pt. Benefits from light UE assist on //-bars.  Pt. Will benefit from skilled PT services to develop HEP to increase L shoulder ROM/ strength in preparation for shoulder replacement.  Pt. Will also benefit from LE strength training/ balance tasks to improve safety/ decrease fall risk with  daily mobility.     OBJECTIVE IMPAIRMENTS: Abnormal gait, cardiopulmonary status limiting activity, decreased activity tolerance, decreased balance, decreased endurance, decreased mobility, difficulty walking, decreased ROM, decreased strength, decreased safety awareness, hypomobility, impaired flexibility, improper body mechanics, postural dysfunction, obesity, and pain.    ACTIVITY LIMITATIONS: carrying, lifting, bending, standing, squatting, sleeping, stairs, transfers, bed mobility, reach over head, and locomotion level   PARTICIPATION LIMITATIONS: meal prep, cleaning, laundry, driving, and community activity   PERSONAL FACTORS: Age, Past/current experiences, and 3+ comorbidities: see above  are also affecting patient's functional outcome.    REHAB POTENTIAL: Good   CLINICAL DECISION MAKING: Evolving/moderate complexity   EVALUATION COMPLEXITY: Moderate     GOALS: Goals reviewed with patient? Yes   SHORT TERM GOALS: Target date: 12/17/21   Pt. Will be independent with HEP to increase L shoulder AROM to >90 deg. Flexion to improve ADLs. Baseline:  see above Goal status: INITIAL   2.  Pt. Will demonstrate ability to descend stairs forward with UE assist on handrail and good knee flexion/ quad control for safety.   Baseline:  descend stairs backwards with heavy UE assist Goal status: INITIAL     LONG TERM GOALS: Target date: 01/14/22   Pt. Will increase FOTO to 62 to improve functional  mobility.  Baseline: initial 42 Goal status: INITIAL   2.  Pt. Will increase Berg balance test to >52 to improve safety/ decrease fall risk with community mobility.  Baseline: 49/56 (discussed use of SPC with outside walking) Goal status: INITIAL   3.  Pt. Will increase L shoulder strength 1/2 muscle grade to prepare for upcoming shoulder replacement/ improve reaching tasks.  Baseline:  see above Goal status: INITIAL   4.  Pt. Able to complete 30 minute exercise program with no increase c/o pain.   Baseline: pt. Has swimming lessons.  Currently not participating with ex. Program.   Goal status: INITIAL   PLAN:   PT FREQUENCY: 1-2x/week   PT DURATION: 8 weeks   PLANNED INTERVENTIONS: Therapeutic exercises, Therapeutic activity, Neuromuscular re-education, Balance training, Gait training, Patient/Family education, Self Care, Joint mobilization, Stair training, Electrical stimulation, Cryotherapy, Moist heat, Manual therapy, and Re-evaluation   PLAN FOR NEXT SESSION: Reassess LE strength/ balance tasks.  Progress L shoulder ROM    Pura Spice, PT, DPT # [phone] 11/22/2021, 7:41 PM

## 2021-11-24 ENCOUNTER — Ambulatory Visit: Payer: Medicare HMO | Admitting: Physical Therapy

## 2021-11-24 DIAGNOSIS — M25512 Pain in left shoulder: Secondary | ICD-10-CM | POA: Diagnosis not present

## 2021-11-24 DIAGNOSIS — R2689 Other abnormalities of gait and mobility: Secondary | ICD-10-CM

## 2021-11-24 DIAGNOSIS — R6889 Other general symptoms and signs: Secondary | ICD-10-CM

## 2021-11-24 DIAGNOSIS — R269 Unspecified abnormalities of gait and mobility: Secondary | ICD-10-CM

## 2021-11-24 DIAGNOSIS — M25612 Stiffness of left shoulder, not elsewhere classified: Secondary | ICD-10-CM

## 2021-11-24 DIAGNOSIS — M6281 Muscle weakness (generalized): Secondary | ICD-10-CM

## 2021-11-24 DIAGNOSIS — G8929 Other chronic pain: Secondary | ICD-10-CM

## 2021-11-24 DIAGNOSIS — R52 Pain, unspecified: Secondary | ICD-10-CM

## 2021-11-26 ENCOUNTER — Ambulatory Visit: Payer: Medicare HMO | Admitting: Physical Therapy

## 2021-12-01 IMAGING — MR MR ABDOMEN WO/W CM MRCP
19 of 21 series · 44 of 48 positions shown · IV contrast (gadavist)
Comparison: Multiple priors, most recently abdominal MRI
11/17/2019.

CLINICAL DATA: 69-year-old female with history of cystic lesion of
the pancreas. Follow-up study.

EXAM:
MRI ABDOMEN WITHOUT AND WITH CONTRAST (INCLUDING MRCP)
TECHNIQUE: Multiplanar multisequence MR imaging of the abdomen was performed
both before and after the administration of intravenous contrast.
Heavily T2-weighted images of the biliary and pancreatic ducts were
obtained, and three-dimensional MRCP images were rendered by post
processing.
CONTRAST:  9mL GADAVIST GADOBUTROL 1 MMOL/ML IV SOLN

[Series 3: T2 · coronal · 6.0mm · 1.19mm/px · 1 of 30 slices shown (1 of 2)]
[im 1/30]
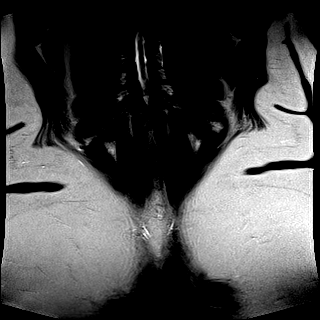

[Series 4: T2 · axial · 6.0mm · 1.19mm/px · 1 of 32 slices shown (2 of 2)]
[im 1/32]
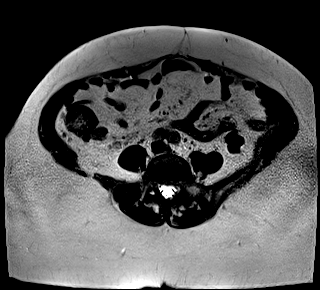

[Series 5: T1 · axial · 3.0mm · 1.19mm/px · z∈[-83,+130]mm · 2 of 72 slices shown]
[im 1/72]
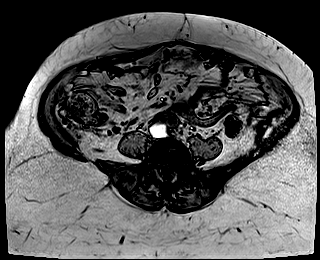
[im 72/72]
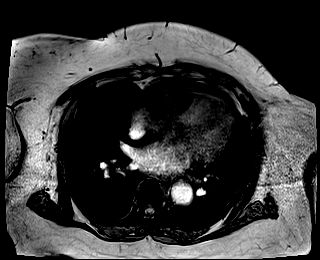

[Series 9: T2 fat-sat · axial · 6.0mm · 1.19mm/px · 1 of 32 slices shown]
[im 1/32]
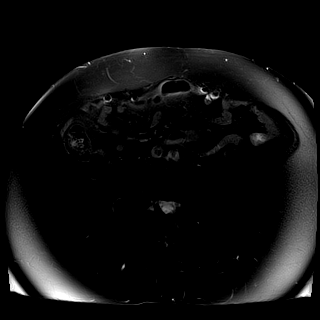

[Series 10: radials · coronal · 50.0mm · 0.78mm/px · 1 of 5 slices shown]
[im 1/5]
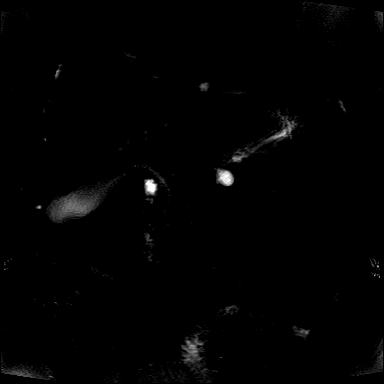

[Series 11: ax dwi_tracew · axial · 6.0mm · 1.42mm/px · z∈[-88,+135]mm · 3 of 96 slices shown]
[im 1/96]
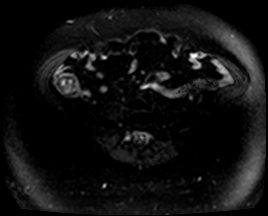
[im 48/96]
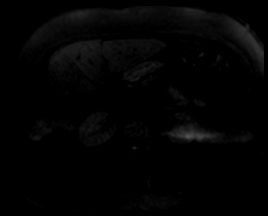
[im 96/96]
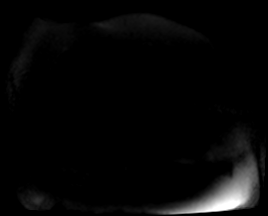

[Series 12: ax dwi_adc · axial · 6.0mm · 1.42mm/px · 1 of 32 slices shown]
[im 1/32]
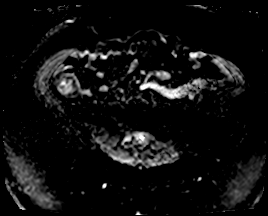

[Series 16: MRCP · coronal · 3.0mm · 1.12mm/px · 1 of 17 slices shown]
[im 1/17]
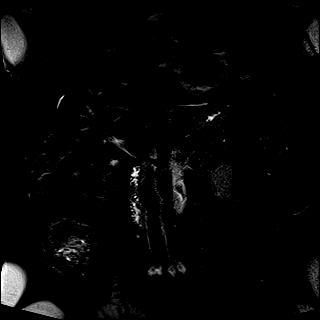

[Series 17: T1 dynamic fat-sat · axial · non-contrast · 3.0mm · 1.19mm/px · z∈[-83,+130]mm · 3 of 72 slices shown (1 of 5)]
[im 1/72]
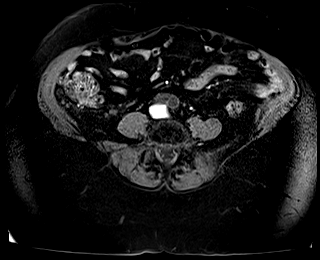
[im 36/72]
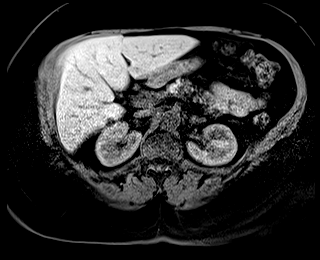
[im 72/72]
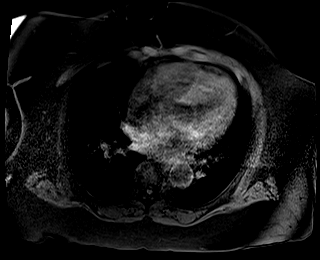

[Series 18: T1 dynamic fat-sat post-contrast · axial · 3.0mm · 1.19mm/px · z∈[-83,+130]mm · 3 of 72 slices shown (1 of 4)]
[im 1/72]
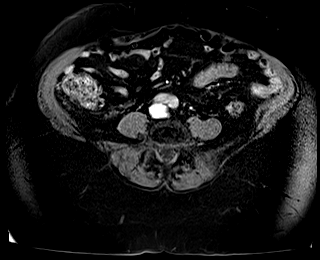
[im 36/72]
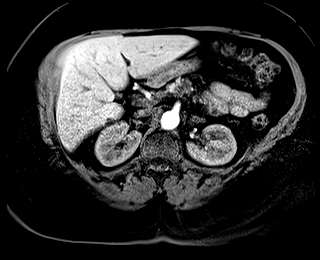
[im 72/72]
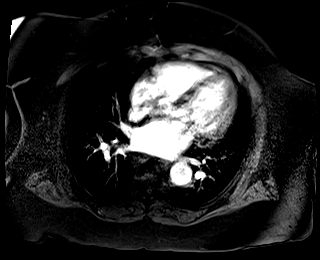

[Series 19: T1 dynamic fat-sat · axial · 3.0mm · 1.19mm/px · z∈[-83,+130]mm · 3 of 72 slices shown (2 of 5)]
[im 1/72]
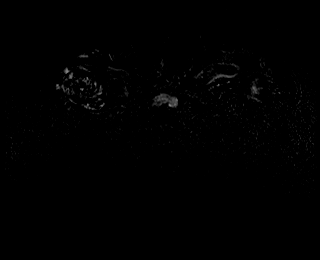
[im 36/72]
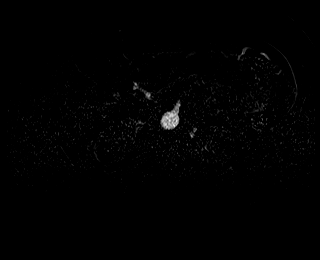
[im 72/72]
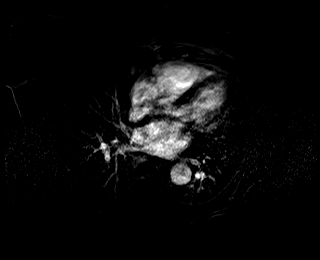

[Series 20: T1 dynamic fat-sat post-contrast · axial · 3.0mm · 1.19mm/px · z∈[-83,+130]mm · 3 of 72 slices shown (2 of 4)]
[im 1/72]
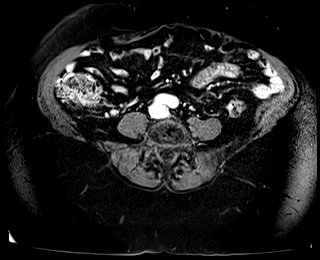
[im 36/72]
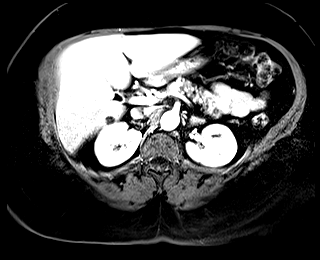
[im 72/72]
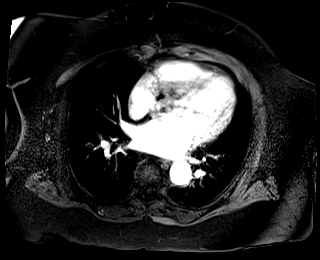

[Series 21: T1 dynamic fat-sat · axial · 3.0mm · 1.19mm/px · z∈[-83,+130]mm · 3 of 72 slices shown (3 of 5)]
[im 1/72]
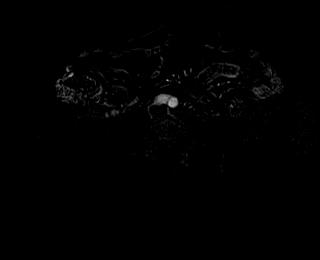
[im 36/72]
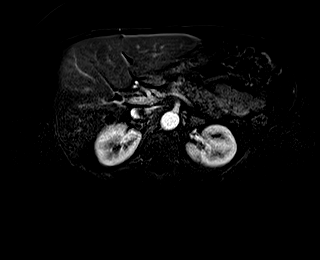
[im 72/72]
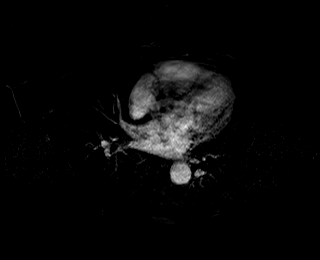

[Series 22: T1 dynamic fat-sat post-contrast · axial · 3.0mm · 1.19mm/px · z∈[-83,+130]mm · 3 of 72 slices shown (3 of 4)]
[im 1/72]
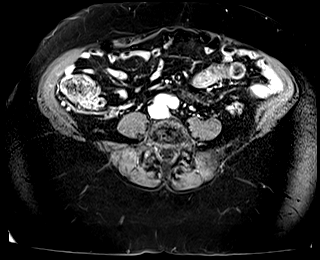
[im 36/72]
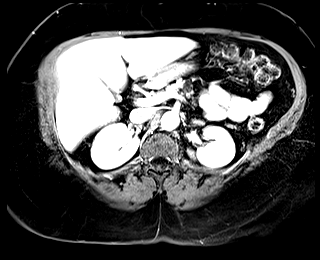
[im 72/72]
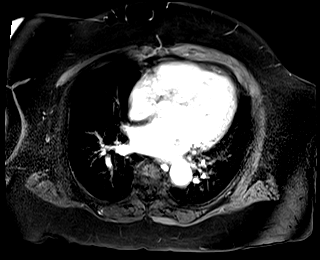

[Series 23: T1 dynamic fat-sat · axial · 3.0mm · 1.19mm/px · z∈[-83,+130]mm · 3 of 72 slices shown (4 of 5)]
[im 1/72]
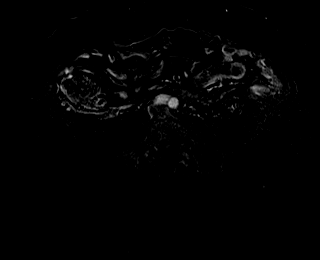
[im 36/72]
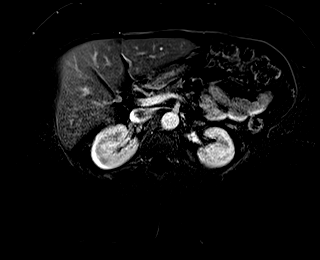
[im 72/72]
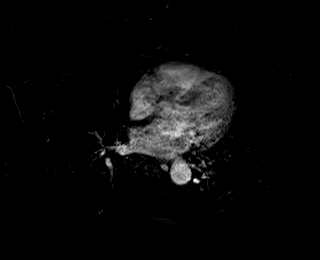

[Series 24: T1 dynamic post-contrast · coronal · 3.0mm · 1.31mm/px · 3 of 72 slices shown]
[im 1/72]
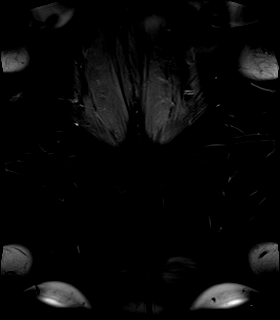
[im 36/72]
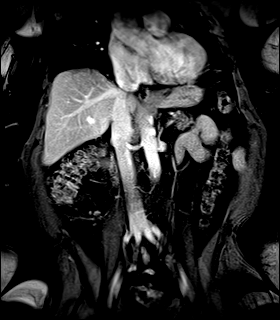
[im 72/72]
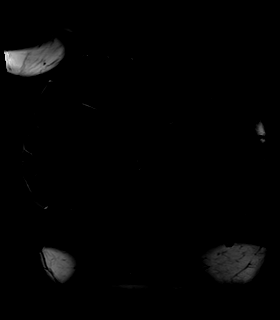

[Series 25: T1 dynamic fat-sat post-contrast · axial · 3.0mm · 1.19mm/px · z∈[-83,+130]mm · 3 of 72 slices shown (4 of 4)]
[im 1/72]
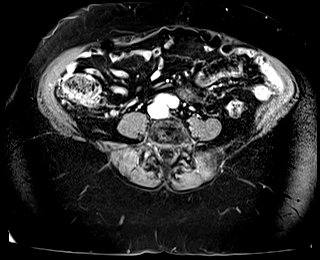
[im 36/72]
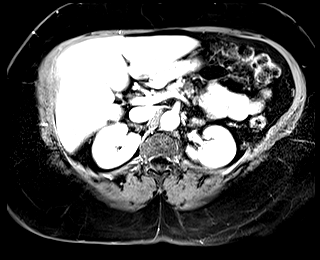
[im 72/72]
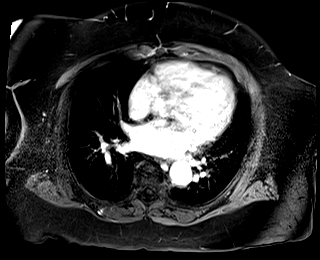

[Series 26: T1 dynamic fat-sat · axial · 3.0mm · 1.19mm/px · z∈[-83,+130]mm · 3 of 72 slices shown (5 of 5)]
[im 1/72]
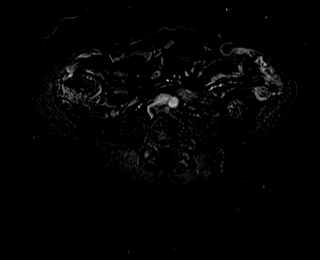
[im 36/72]
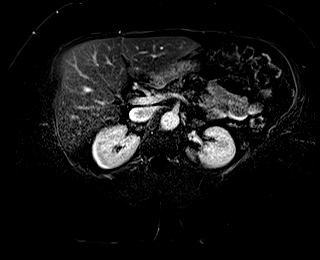
[im 72/72]
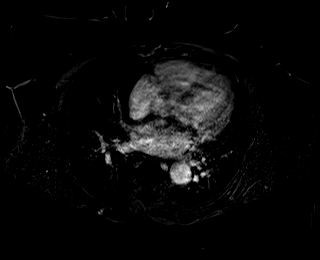

[Series 1009: in phase · axial · 3.0mm · 1.19mm/px · z∈[-83,+130]mm · 3 of 72 slices shown]
[im 1/72]
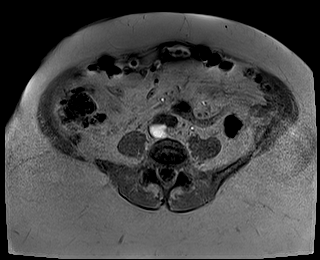
[im 36/72]
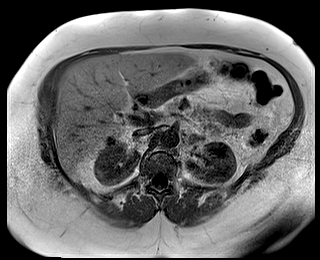
[im 72/72]
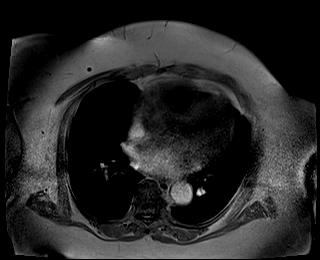

[44 of 48 positions shown; findings below may reference images not displayed]

FINDINGS: Lower chest: Cardiomegaly.

Hepatobiliary: Several small T1 hypointense, T2 hyperintense,
nonenhancing lesions are noted throughout the liver, largest of
which is in the central aspect of segment 6 (axial image 17 of
series 4) measuring 1.1 cm, compatible with small simple cysts. No
other suspicious hepatic lesions. No intra or extrahepatic biliary
ductal dilatation noted on MRCP images. In the dependent portion of
the gallbladder there is some amorphous mildly T1 hyperintense and
T2 hypointense material, compatible with biliary sludge. Gallbladder
is not distended. Gallbladder wall thickness is normal. No
pericholecystic fluid or surrounding inflammatory changes.

Pancreas: 3 small pancreatic lesions are again noted in the body and
tail of the pancreas, demonstrating low T1 signal intensity and high
T2 signal intensity without internal enhancement. The largest of
these has a thin internal septation and is in the body of the
pancreas (axial image 16 of series 9) measuring 1.7 x 1.4 cm,
unchanged. The other smaller lesions are also unchanged. No new
suspicious appearing pancreatic mass. No pancreatic ductal
dilatation noted on MRCP images. These lesions do not clearly
communicate with the main pancreatic duct. No peripancreatic fluid
collections or inflammatory changes.

Spleen:  Unremarkable.

Adrenals/Urinary Tract: Subcentimeter T1 hypointense, T2
hyperintense, nonenhancing lesions in both kidneys, compatible with
tiny simple cysts. No suspicious renal lesions. No
hydroureteronephrosis in the visualized portions of the abdomen.
Bilateral adrenal glands are normal in appearance.

Stomach/Bowel: Visualized portions are unremarkable.

Vascular/Lymphatic: No aneurysm identified in the visualized
abdominal vasculature. No lymphadenopathy noted in the abdomen.

Other: No significant volume of ascites noted in the visualized
portions of the peritoneal cavity.

Musculoskeletal: No aggressive appearing osseous lesions are noted
in the visualized portions of the skeleton.
IMPRESSION: 1. Small cystic lesions in the pancreas, stable compared to the
prior study, favored to represent either side branch ectasia or
small side branch IPMN (intraductal papillary mucinous neoplasms).
Their stability is reassuring, but continued attention on follow-up
abdominal MRI with and without IV gadolinium is recommended in 1
year to ensure continued stability. This recommendation follows ACR
consensus guidelines: Management of Incidental Pancreatic Cysts: A
White Paper of the ACR Incidental Findings Committee. [HOSPITAL] 2837;[DATE].
2. Cardiomegaly.
3. Additional incidental findings, as above.

## 2021-12-02 ENCOUNTER — Encounter: Payer: Medicare HMO | Admitting: Physical Therapy

## 2021-12-04 ENCOUNTER — Encounter: Payer: Medicare HMO | Admitting: Physical Therapy

## 2021-12-04 NOTE — Therapy (Addendum)
OUTPATIENT PHYSICAL THERAPY SHOULDER TREATMENT   Patient Name: Darlene Chen MRN: [id number] DOB:25-Dec-1950, 71 y.o., female Today's Date: 11/24/2021  END OF SESSION:  PT End of Session - 12/04/21 1737     Visit Number 3    Number of Visits 16    Date for PT Re-Evaluation 01/14/22    PT Start Time 1117    PT Stop Time 1202    PT Time Calculation (min) 45 min             Past Medical History:  Diagnosis Date   Diabetes mellitus without complication ([place])    High cholesterol    Hypertension    Neutropenia (HCC)    Neutropenia (HCC)    Past Surgical History:  Procedure Laterality Date   arthroscopic knee surgery  on left 2000     BREAST BIOPSY Right 2019   bx/clip-neg   LEFT HEART CATH AND CORONARY ANGIOGRAPHY N/A 02/09/2017   Procedure: LEFT HEART CATH AND CORONARY ANGIOGRAPHY;  Surgeon: Yolonda Kida, MD;  Location: [facility];  Service: Cardiovascular;  Laterality: N/A;   REDUCTION MAMMAPLASTY Bilateral    1990   SHOULDER SURGERY Right    TUBAL LIGATION  4098   UMBILICAL HERNIA REPAIR  2007   Patient Active Problem List   Diagnosis Date Noted   Pancreatic cyst 11/21/2018   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   Leucopenia 07/06/2018   B12 deficiency 07/06/2018   Fatty liver disease, nonalcoholic 11/91/4782   Onychomycosis 07/20/2017   Hypertensive disorder 12/03/2016   Intracranial aneurysm 12/03/2016   Family history of brain aneurysm 08/07/2016   Obstructive sleep apnea 06/17/2016   Hyperglycemia, unspecified 01/29/2016   Morbid obesity with BMI of 45.0-49.9, adult ([place]) 04/08/2015   Myalgia 11/15/2014   Costochondritis 04/11/2014   Osteopenia 04/11/2014   Prediabetes 02/15/2014   Hypercholesterolemia 09/26/2013   Degenerative arthritis of left knee 06/02/2013    PCP: Sharyne Peach, MD   REFERRING PROVIDER: Cheryl Flash, MD; Greggory Stallion Deep, NP   REFERRING DIAG:  Rotator cuff arthropathy of left shoulder                          Fear of falling                                   Gait abnormality   THERAPY DIAG:  Chronic left shoulder pain   Shoulder joint stiffness, left   Muscle weakness (generalized)   Gait difficulty   Imbalance   Rationale for Evaluation and Treatment: Rehabilitation   ONSET DATE: Chronic   SUBJECTIVE:  SUBJECTIVE STATEMENT:   EVALUATION Pt. Referred to PT for chronic L shoulder pain by Dr. Earlie Counts and for gait/ balance issues by Toy Care, Nab Deep, NP.  Pt. Is planning for L shoulder replacement due to severity of L shoulder pain/ limitations with daily movement.  Pt. Know well to PT clinic due to R shoulder replacement/ balance/ gait and ankle issues in past.  Pt. Reports >10/10 L shoulder pain with reaching/ sudden movements.  Pt. Is R hand dominant.  Pt. States making bed is really difficulty.  No L shoulder pain at rest.  Pt. Is a R side sleeper.  Pt. Reports no falls but scared of the result of a fall.  Fearful of step ladder.     PERTINENT HISTORY: See MD notes.  Pt. Known well to PT clinic.  MD appt. For L shoulder on 01/29/22.     PAIN:  Are you having pain? Yes: NPRS scale: 10/10 Pain location: L shoulder Pain description: sharp Aggravating factors: reaching/ sudden movements Relieving factors: rest   PRECAUTIONS: Fall   WEIGHT BEARING RESTRICTIONS: No   FALLS:  Has patient fallen in last 6 months? No   LIVING ENVIRONMENT: Lives with: lives with their spouse Lives in: House/apartment Has following equipment at home: Single point cane   OCCUPATION: Retired.     PLOF: Independent   PATIENT GOALS:  Increase L shoulder ROM/ strength (prepare for shoulder replacement) and improve gait/balance.    NEXT MD VISIT: 01/29/22    OBJECTIVE:    DIAGNOSTIC FINDINGS:  Bones/Joint/Cartilage   No fracture or dislocation. Normal alignment. Small joint effusion.   Severe osteoarthritis of the glenohumeral joint with severe joint space narrowing, a bone-on-bone appearance, subchondral sclerosis, subchondral cystic changes and marginal osteophytosis.   Mild arthropathy of the acromioclavicular joint.   Ligaments   Ligaments are suboptimally evaluated by CT.   Muscles and Tendons No significant muscle atrophy. No intramuscular fluid collection or hematoma. Rotator cuff is suboptimally evaluated by CT without intra-articular contrast.   Soft tissue No fluid collection or hematoma. No soft tissue mass. Visualized portions of the lung are clear. Thoracic aortic atherosclerosis.   IMPRESSION: 1. Severe osteoarthritis of the left glenohumeral joint. 2. Aortic Atherosclerosis (ICD10-I70.0).     PATIENT SURVEYS:  FOTO initial 41/ goal 50   COGNITION: Overall cognitive status: Within functional limits for tasks assessed                                  SENSATION: WFL   POSTURE: Rounder shoulder/ forward posture in sitting and standing   UPPER EXTREMITY ROM:    Active ROM Right eval Left eval  Shoulder flexion 134 deg 84 deg.  Shoulder extension      Shoulder abduction 132 deg. 70 deg.  Shoulder adduction      Shoulder internal rotation 74 deg. 52 deg.  Shoulder external rotation 70 deg. 49 deg.  Elbow flexion [facility] WFL  Elbow extension [facility]  Wrist flexion [facility] WFL  Wrist extension [facility] WFL  Wrist ulnar deviation      Wrist radial deviation      Wrist pronation      Wrist supination      (Blank rows = not tested)   UPPER EXTREMITY MMT:   MMT Right eval Left eval  Shoulder flexion 4 3  Shoulder extension 4+    Shoulder abduction 4 3  Shoulder adduction  Shoulder internal rotation 4+ 3  Shoulder external rotation 4 3  Middle trapezius      Lower trapezius      Elbow flexion 5 4   Elbow extension 5 4  Wrist flexion      Wrist extension      Wrist ulnar deviation      Wrist radial deviation      Wrist pronation      Wrist supination      Grip strength (lbs)      (Blank rows = not tested)   B LE AROM WFL and strength grossly 5/5 MMT except hip flexion/abduction 4/5 MMT.     SHOULDER SPECIAL TESTS: Unable to perform to L shoulder due to limitations in ROM and severe pain.     JOINT MOBILITY TESTING:  Significant L shoulder crepitus/ bony end-feel.   Catching/ locking noted in L shoulder with PROM.    PALPATION:  Minimal L shoulder tenderness with palpation.  L UT muscle tightness.    Berg balance test: 49/56  (discussed use of SPC/ stairs)             TODAY'S TREATMENT:                                                                                                                          DATE: 11/24/21   Subjective:  Pt. Reports no new complaints.  Pt. Had a good weekend.  Pt. Enters PT without use of SPC.  No falls.    There.ex.:   Nustep L3 10 min. B UE/LE 10 min.     Seated B shoulder wand ex. [place] Ship with PT assist on L): chest press/ shoulder flexion/ bicep curls 20x.  Standing shoulder extension/ IR.     //-bars: forward/backwards/lateral walking with light UE assist and mirror feedback.  Pt. Prefers not to look at mirror.    6" step touches (no step ups)- 20x.   Reviewed HEP    Manual tx.:  STM to B UT/ neck in seated posture after tx.  Marked L UT muscle tightness  Supine L shoulder AA/PROM (all planes)- as tolerated.    Discussed use of SPC.  Pt. Has SPC at home.        PATIENT EDUCATION: Education details: L shoulder HEP/ swim lessons Person educated: Patient Education method: Customer service manager Education comprehension: verbalized understanding and returned demonstration   HOME EXERCISE PROGRAM: Pulley/ wand ex.   ASSESSMENT:   CLINICAL IMPRESSION: Pt. Works hard during PT tx. Session.  Significant L shoulder  crepitus/ catching noted during seated wand ex. With PT assist.  Pt. Benefits from use of SPC with 2-point gait pattern and consistent step pattern.  No LOB during //-bar ex. And pt. Benefits from light UE assist on //-bars.  Pt. Will benefit from skilled PT services to develop HEP to increase L shoulder ROM/ strength in preparation for shoulder replacement.  Pt. Will also benefit from LE strength training/ balance tasks to  improve safety/ decrease fall risk with daily mobility.     OBJECTIVE IMPAIRMENTS: Abnormal gait, cardiopulmonary status limiting activity, decreased activity tolerance, decreased balance, decreased endurance, decreased mobility, difficulty walking, decreased ROM, decreased strength, decreased safety awareness, hypomobility, impaired flexibility, improper body mechanics, postural dysfunction, obesity, and pain.    ACTIVITY LIMITATIONS: carrying, lifting, bending, standing, squatting, sleeping, stairs, transfers, bed mobility, reach over head, and locomotion level   PARTICIPATION LIMITATIONS: meal prep, cleaning, laundry, driving, and community activity   PERSONAL FACTORS: Age, Past/current experiences, and 3+ comorbidities: see above  are also affecting patient's functional outcome.    REHAB POTENTIAL: Good   CLINICAL DECISION MAKING: Evolving/moderate complexity   EVALUATION COMPLEXITY: Moderate     GOALS: Goals reviewed with patient? Yes   SHORT TERM GOALS: Target date: 12/17/21   Pt. Will be independent with HEP to increase L shoulder AROM to >90 deg. Flexion to improve ADLs. Baseline:  see above Goal status: INITIAL   2.  Pt. Will demonstrate ability to descend stairs forward with UE assist on handrail and good knee flexion/ quad control for safety.   Baseline:  descend stairs backwards with heavy UE assist Goal status: INITIAL     LONG TERM GOALS: Target date: 01/14/22   Pt. Will increase FOTO to 62 to improve functional mobility.  Baseline: initial 42 Goal  status: INITIAL   2.  Pt. Will increase Berg balance test to >52 to improve safety/ decrease fall risk with community mobility.  Baseline: 49/56 (discussed use of SPC with outside walking) Goal status: INITIAL   3.  Pt. Will increase L shoulder strength 1/2 muscle grade to prepare for upcoming shoulder replacement/ improve reaching tasks.  Baseline:  see above Goal status: INITIAL   4.  Pt. Able to complete 30 minute exercise program with no increase c/o pain.   Baseline: pt. Has swimming lessons.  Currently not participating with ex. Program.   Goal status: INITIAL   PLAN:   PT FREQUENCY: 1-2x/week   PT DURATION: 8 weeks   PLANNED INTERVENTIONS: Therapeutic exercises, Therapeutic activity, Neuromuscular re-education, Balance training, Gait training, Patient/Family education, Self Care, Joint mobilization, Stair training, Electrical stimulation, Cryotherapy, Moist heat, Manual therapy, and Re-evaluation   PLAN FOR NEXT SESSION: Progress HEP/ discuss swimming lessons.    Pura Spice, PT, DPT # [id number] 12/04/2021, 5:39 PM

## 2021-12-08 ENCOUNTER — Ambulatory Visit: Payer: Medicare HMO | Attending: Nurse Practitioner | Admitting: Physical Therapy

## 2021-12-08 DIAGNOSIS — R2689 Other abnormalities of gait and mobility: Secondary | ICD-10-CM

## 2021-12-08 DIAGNOSIS — R269 Unspecified abnormalities of gait and mobility: Secondary | ICD-10-CM | POA: Diagnosis present

## 2021-12-08 DIAGNOSIS — M6281 Muscle weakness (generalized): Secondary | ICD-10-CM | POA: Diagnosis present

## 2021-12-08 DIAGNOSIS — M25512 Pain in left shoulder: Secondary | ICD-10-CM | POA: Diagnosis present

## 2021-12-08 DIAGNOSIS — G8929 Other chronic pain: Secondary | ICD-10-CM

## 2021-12-08 DIAGNOSIS — M25612 Stiffness of left shoulder, not elsewhere classified: Secondary | ICD-10-CM

## 2021-12-10 ENCOUNTER — Ambulatory Visit: Payer: Medicare HMO | Admitting: Physical Therapy

## 2021-12-10 DIAGNOSIS — M25612 Stiffness of left shoulder, not elsewhere classified: Secondary | ICD-10-CM

## 2021-12-10 DIAGNOSIS — G8929 Other chronic pain: Secondary | ICD-10-CM

## 2021-12-10 DIAGNOSIS — M25512 Pain in left shoulder: Secondary | ICD-10-CM | POA: Diagnosis not present

## 2021-12-10 DIAGNOSIS — R269 Unspecified abnormalities of gait and mobility: Secondary | ICD-10-CM

## 2021-12-10 DIAGNOSIS — R2689 Other abnormalities of gait and mobility: Secondary | ICD-10-CM

## 2021-12-10 DIAGNOSIS — M6281 Muscle weakness (generalized): Secondary | ICD-10-CM

## 2021-12-15 ENCOUNTER — Ambulatory Visit: Payer: Medicare HMO | Admitting: Physical Therapy

## 2021-12-15 DIAGNOSIS — M25612 Stiffness of left shoulder, not elsewhere classified: Secondary | ICD-10-CM

## 2021-12-15 DIAGNOSIS — R2689 Other abnormalities of gait and mobility: Secondary | ICD-10-CM

## 2021-12-15 DIAGNOSIS — M6281 Muscle weakness (generalized): Secondary | ICD-10-CM

## 2021-12-15 DIAGNOSIS — G8929 Other chronic pain: Secondary | ICD-10-CM

## 2021-12-15 DIAGNOSIS — R269 Unspecified abnormalities of gait and mobility: Secondary | ICD-10-CM

## 2021-12-15 DIAGNOSIS — M25512 Pain in left shoulder: Secondary | ICD-10-CM | POA: Diagnosis not present

## 2021-12-20 NOTE — Therapy (Addendum)
OUTPATIENT PHYSICAL THERAPY SHOULDER TREATMENT   Patient Name: Darlene Chen MRN: [id number] DOB:Aug 22, 1950, 71 y.o., female Today's Date: 1211/2023  END OF SESSION:  PT End of Session - 12/20/21 0720     Visit Number 6    Number of Visits 16    Date for PT Re-Evaluation 01/14/22    PT Start Time 1119    PT Stop Time 1205    PT Time Calculation (min) 46 min             Past Medical History:  Diagnosis Date   Diabetes mellitus without complication ([place])    High cholesterol    Hypertension    Neutropenia (HCC)    Neutropenia (HCC)    Past Surgical History:  Procedure Laterality Date   arthroscopic knee surgery  on left 2000     BREAST BIOPSY Right 2019   bx/clip-neg   LEFT HEART CATH AND CORONARY ANGIOGRAPHY N/A 02/09/2017   Procedure: LEFT HEART CATH AND CORONARY ANGIOGRAPHY;  Surgeon: Yolonda Kida, MD;  Location: [facility];  Service: Cardiovascular;  Laterality: N/A;   REDUCTION MAMMAPLASTY Bilateral    1990   SHOULDER SURGERY Right    TUBAL LIGATION  8295   UMBILICAL HERNIA REPAIR  2007   Patient Active Problem List   Diagnosis Date Noted   Pancreatic cyst 11/21/2018   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[place]) 09/21/2018   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   Leucopenia 07/06/2018   B12 deficiency 07/06/2018   Fatty liver disease, nonalcoholic 62/13/0865   Onychomycosis 07/20/2017   Hypertensive disorder 12/03/2016   Intracranial aneurysm 12/03/2016   Family history of brain aneurysm 08/07/2016   Obstructive sleep apnea 06/17/2016   Hyperglycemia, unspecified 01/29/2016   Morbid obesity with BMI of 45.0-49.9, adult ([place]) 04/08/2015   Myalgia 11/15/2014   Costochondritis 04/11/2014   Osteopenia 04/11/2014   Prediabetes 02/15/2014   Hypercholesterolemia 09/26/2013   Degenerative arthritis of left knee 06/02/2013   PCP: Sharyne Peach, MD   REFERRING PROVIDER: Cheryl Flash, MD; Greggory Stallion Deep, NP   REFERRING DIAG:  Rotator cuff arthropathy of left shoulder                          Fear of falling                                   Gait abnormality   THERAPY DIAG:  Chronic left shoulder pain   Shoulder joint stiffness, left   Muscle weakness (generalized)   Gait difficulty   Imbalance   Rationale for Evaluation and Treatment: Rehabilitation   ONSET DATE: Chronic   SUBJECTIVE:  SUBJECTIVE STATEMENT:   EVALUATION Pt. Referred to PT for chronic L shoulder pain by Dr. Earlie Counts and for gait/ balance issues by Toy Care, Nab Deep, NP.  Pt. Is planning for L shoulder replacement due to severity of L shoulder pain/ limitations with daily movement.  Pt. Know well to PT clinic due to R shoulder replacement/ balance/ gait and ankle issues in past.  Pt. Reports >10/10 L shoulder pain with reaching/ sudden movements.  Pt. Is R hand dominant.  Pt. States making bed is really difficulty.  No L shoulder pain at rest.  Pt. Is a R side sleeper.  Pt. Reports no falls but scared of the result of a fall.  Fearful of step ladder.     PERTINENT HISTORY: See MD notes.  Pt. Known well to PT clinic.  MD appt. For L shoulder on 01/29/22.     PAIN:  Are you having pain? Yes: NPRS scale: 10/10 Pain location: L shoulder Pain description: sharp Aggravating factors: reaching/ sudden movements Relieving factors: rest   PRECAUTIONS: Fall   WEIGHT BEARING RESTRICTIONS: No   FALLS:  Has patient fallen in last 6 months? No   LIVING ENVIRONMENT: Lives with: lives with their spouse Lives in: House/apartment Has following equipment at home: Single point cane   OCCUPATION: Retired.     PLOF: Independent   PATIENT GOALS:  Increase L shoulder ROM/ strength (prepare for shoulder replacement) and improve gait/balance.    NEXT MD VISIT: 01/29/22    OBJECTIVE:    DIAGNOSTIC FINDINGS:  Bones/Joint/Cartilage   No fracture or dislocation. Normal alignment. Small joint effusion.   Severe osteoarthritis of the glenohumeral joint with severe joint space narrowing, a bone-on-bone appearance, subchondral sclerosis, subchondral cystic changes and marginal osteophytosis.   Mild arthropathy of the acromioclavicular joint.   Ligaments   Ligaments are suboptimally evaluated by CT.   Muscles and Tendons No significant muscle atrophy. No intramuscular fluid collection or hematoma. Rotator cuff is suboptimally evaluated by CT without intra-articular contrast.   Soft tissue No fluid collection or hematoma. No soft tissue mass. Visualized portions of the lung are clear. Thoracic aortic atherosclerosis.   IMPRESSION: 1. Severe osteoarthritis of the left glenohumeral joint. 2. Aortic Atherosclerosis (ICD10-I70.0).     PATIENT SURVEYS:  FOTO initial 41/ goal 50   COGNITION: Overall cognitive status: Within functional limits for tasks assessed                                  SENSATION: WFL   POSTURE: Rounder shoulder/ forward posture in sitting and standing   UPPER EXTREMITY ROM:    Active ROM Right eval Left eval  Shoulder flexion 134 deg 84 deg.  Shoulder extension      Shoulder abduction 132 deg. 70 deg.  Shoulder adduction      Shoulder internal rotation 74 deg. 52 deg.  Shoulder external rotation 70 deg. 49 deg.  Elbow flexion [facility] WFL  Elbow extension [facility]  Wrist flexion [facility] WFL  Wrist extension [facility] WFL  Wrist ulnar deviation      Wrist radial deviation      Wrist pronation      Wrist supination      (Blank rows = not tested)   UPPER EXTREMITY MMT:   MMT Right eval Left eval  Shoulder flexion 4 3  Shoulder extension 4+    Shoulder abduction 4 3  Shoulder adduction  Shoulder internal rotation 4+ 3  Shoulder external rotation 4 3  Middle trapezius      Lower trapezius      Elbow flexion 5 4   Elbow extension 5 4  Wrist flexion      Wrist extension      Wrist ulnar deviation      Wrist radial deviation      Wrist pronation      Wrist supination      Grip strength (lbs)      (Blank rows = not tested)   B LE AROM WFL and strength grossly 5/5 MMT except hip flexion/abduction 4/5 MMT.     SHOULDER SPECIAL TESTS: Unable to perform to L shoulder due to limitations in ROM and severe pain.     JOINT MOBILITY TESTING:  Significant L shoulder crepitus/ bony end-feel.   Catching/ locking noted in L shoulder with PROM.    PALPATION:  Minimal L shoulder tenderness with palpation.  L UT muscle tightness.    Berg balance test: 49/56  (discussed use of SPC/ stairs)             TODAY'S TREATMENT:                                                                                                                          DATE: 12/15/21   Subjective:  Pt. Had a busy weekend.  No new complaints.      There.ex.:    Nustep L4 10 min. B UE/LE 10 min. (Seat #7).  Discussed weekend activities.      Seated B shoulder wand ex. [employer] with PT assist on L): chest press/ shoulder flexion/ bicep curls 20x.  Standing shoulder extension/ IR.     Standing wall ladder with PT assist at L elbow due to sh. Crepitus/ instability.    Standing marching/ hip abduction/ seated LAQ 20x each.     No Nautilus today secondary to significant L shoulder crepitus.     Neuro:   Walking in hallway with recip. Pattern/ arm swing.  Cuing to avoid lateral sway.  Discussed use of SPC.  //-bars: forward/backwards/lateral walking with light to no UE assist.  Slight sway note without UE assist.     5xSTS: 14.97 seconds   6" step touches: toe and heel 10x2 each in //-bars.     Walking to car without use of SPC and consistent step pattern.      No manual tx. Today.       PATIENT EDUCATION: Education details: L shoulder HEP/ swim lessons Person educated: Patient Education method: Holiday representative Education comprehension: verbalized understanding and returned demonstration   HOME EXERCISE PROGRAM: Pulley/ wand ex.   ASSESSMENT:   CLINICAL IMPRESSION: Significant L shoulder crepitus/ catching during all UE/wand ex. With PT assist.  Fatigue noted with standing step touches without UE assist and walking in hallway/ clinic.  No improvement in L shoulder AROM (see goal).  Pt. Will benefit from  skilled PT services to develop HEP to increase L shoulder ROM/ strength in preparation for shoulder replacement.  Pt. Will also benefit from LE strength training/ balance tasks to improve safety/ decrease fall risk with daily mobility.     OBJECTIVE IMPAIRMENTS: Abnormal gait, cardiopulmonary status limiting activity, decreased activity tolerance, decreased balance, decreased endurance, decreased mobility, difficulty walking, decreased ROM, decreased strength, decreased safety awareness, hypomobility, impaired flexibility, improper body mechanics, postural dysfunction, obesity, and pain.    ACTIVITY LIMITATIONS: carrying, lifting, bending, standing, squatting, sleeping, stairs, transfers, bed mobility, reach over head, and locomotion level   PARTICIPATION LIMITATIONS: meal prep, cleaning, laundry, driving, and community activity   PERSONAL FACTORS: Age, Past/current experiences, and 3+ comorbidities: see above  are also affecting patient's functional outcome.    REHAB POTENTIAL: Good   CLINICAL DECISION MAKING: Evolving/moderate complexity   EVALUATION COMPLEXITY: Moderate     GOALS: Goals reviewed with patient? Yes   SHORT TERM GOALS: Target date: 12/17/21   Pt. Will be independent with HEP to increase L shoulder AROM to >90 deg. Flexion to improve ADLs. Baseline:  see above.  12/11: <90 deg. flexion Goal status: Not met   2.  Pt. Will demonstrate ability to descend stairs forward with UE assist on handrail and good knee flexion/ quad control for safety.   Baseline:  descend  stairs backwards with heavy UE assist Goal status: Partially met     LONG TERM GOALS: Target date: 01/14/22   Pt. Will increase FOTO to 62 to improve functional mobility.  Baseline: initial 42 Goal status: INITIAL   2.  Pt. Will increase Berg balance test to >52 to improve safety/ decrease fall risk with community mobility.  Baseline: 49/56 (discussed use of SPC with outside walking) Goal status: INITIAL   3.  Pt. Will increase L shoulder strength 1/2 muscle grade to prepare for upcoming shoulder replacement/ improve reaching tasks.  Baseline:  see above Goal status: INITIAL   4.  Pt. Able to complete 30 minute exercise program with no increase c/o pain.   Baseline: pt. Has swimming lessons.  Currently not participating with ex. Program.   Goal status: INITIAL   PLAN:   PT FREQUENCY: 1-2x/week   PT DURATION: 8 weeks   PLANNED INTERVENTIONS: Therapeutic exercises, Therapeutic activity, Neuromuscular re-education, Balance training, Gait training, Patient/Family education, Self Care, Joint mobilization, Stair training, Electrical stimulation, Cryotherapy, Moist heat, Manual therapy, and Re-evaluation   PLAN FOR NEXT SESSION: Progress dynamic gait/ HEP    Pura Spice, PT, DPT # [phone] 12/20/2021, 7:22 AM

## 2021-12-20 NOTE — Therapy (Addendum)
OUTPATIENT PHYSICAL THERAPY SHOULDER TREATMENT   Patient Name: Darlene Chen MRN: [id number] DOB:09/24/1950, 71 y.o., female [id number] DOB:09/24/1950, 71 y.o., , female Today's Date: 12/08/2021  END OF SESSION:  PT End of Session - 12/20/21 0709     Visit Number 4    Number of Visits 16    Date for PT Re-Evaluation 01/14/22    PT Start Time 1118    PT Stop Time 1204    PT Time Calculation (min) 46 min             Past Medical History:  Diagnosis Date   Diabetes mellitus without complication ([place])    High cholesterol    Hypertension    Neutropenia (HCC)    Neutropenia (HCC)    Past Surgical History:  Procedure Laterality Date   arthroscopic knee surgery  on left 2000     BREAST BIOPSY Right 2019   bx/clip-neg   LEFT HEART CATH AND CORONARY ANGIOGRAPHY N/A 02/09/2017   Procedure: LEFT HEART CATH AND CORONARY ANGIOGRAPHY;  Surgeon: Yolonda Kida, MD;  Location: [facility];  Service: Cardiovascular;  Laterality: N/A;   REDUCTION MAMMAPLASTY Bilateral    1990   SHOULDER SURGERY Right    TUBAL LIGATION  9381   UMBILICAL HERNIA REPAIR  2007   Patient Active Problem List   Diagnosis Date Noted   Pancreatic cyst 11/21/2018   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[place]) 09/21/2018   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   Leucopenia 07/06/2018   B12 deficiency 07/06/2018   Fatty liver disease, nonalcoholic 82/99/3716   Onychomycosis 07/20/2017   Hypertensive disorder 12/03/2016   Intracranial aneurysm 12/03/2016   Family history of brain aneurysm 08/07/2016   Obstructive sleep apnea 06/17/2016   Hyperglycemia, unspecified 01/29/2016   Morbid obesity with BMI of 45.0-49.9, adult ([place]) 04/08/2015   Myalgia 11/15/2014   Costochondritis 04/11/2014   Osteopenia 04/11/2014   Prediabetes 02/15/2014   Hypercholesterolemia 09/26/2013   Degenerative arthritis of left knee 06/02/2013   PCP: Sharyne Peach, MD   REFERRING PROVIDER: Cheryl Flash, MD; Greggory Stallion Deep, NP   REFERRING DIAG:  Rotator cuff arthropathy of left shoulder                          Fear of falling                                   Gait abnormality   THERAPY DIAG:  Chronic left shoulder pain   Shoulder joint stiffness, left   Muscle weakness (generalized)   Gait difficulty   Imbalance   Rationale for Evaluation and Treatment: Rehabilitation   ONSET DATE: Chronic   SUBJECTIVE:  SUBJECTIVE STATEMENT:   EVALUATION Pt. Referred to PT for chronic L shoulder pain by Dr. Earlie Counts and for gait/ balance issues by [employer], Nab Deep, NP.  Pt. Is planning for L shoulder replacement due to severity of L shoulder pain/ limitations with daily movement.  Pt. Know well to PT clinic due to R shoulder replacement/ balance/ gait and ankle issues in past.  Pt. Reports >10/10 L shoulder pain with reaching/ sudden movements.  Pt. Is R hand dominant.  Pt. States making bed is really difficulty.  No L shoulder pain at rest.  Pt. Is a R side sleeper.  Pt. Reports no falls but scared of the result of a fall.  Fearful of step ladder.     PERTINENT HISTORY: See MD notes.  Pt. Known well to PT clinic.  MD appt. For L shoulder on 01/29/22.     PAIN:  Are you having pain? Yes: NPRS scale: 10/10 Pain location: L shoulder Pain description: sharp Aggravating factors: reaching/ sudden movements Relieving factors: rest   PRECAUTIONS: Fall   WEIGHT BEARING RESTRICTIONS: No   FALLS:  Has patient fallen in last 6 months? No   LIVING ENVIRONMENT: Lives with: lives with their spouse Lives in: House/apartment Has following equipment at home: Single point cane   OCCUPATION: Retired.     PLOF: Independent   PATIENT GOALS:  Increase L shoulder ROM/ strength (prepare for shoulder replacement) and improve gait/balance.    NEXT MD VISIT: 01/29/22    OBJECTIVE:    DIAGNOSTIC FINDINGS:  Bones/Joint/Cartilage   No fracture or dislocation. Normal alignment. Small joint effusion.   Severe osteoarthritis of the glenohumeral joint with severe joint space narrowing, a bone-on-bone appearance, subchondral sclerosis, subchondral cystic changes and marginal osteophytosis.   Mild arthropathy of the acromioclavicular joint.   Ligaments   Ligaments are suboptimally evaluated by CT.   Muscles and Tendons No significant muscle atrophy. No intramuscular fluid collection or hematoma. Rotator cuff is suboptimally evaluated by CT without intra-articular contrast.   Soft tissue No fluid collection or hematoma. No soft tissue mass. Visualized portions of the lung are clear. Thoracic aortic atherosclerosis.   IMPRESSION: 1. Severe osteoarthritis of the left glenohumeral joint. 2. Aortic Atherosclerosis (ICD10-I70.0).     PATIENT SURVEYS:  FOTO initial 41/ goal 50   COGNITION: Overall cognitive status: Within functional limits for tasks assessed                                  SENSATION: WFL   POSTURE: Rounder shoulder/ forward posture in sitting and standing   UPPER EXTREMITY ROM:    Active ROM Right eval Left eval  Shoulder flexion 134 deg 84 deg.  Shoulder extension      Shoulder abduction 132 deg. 70 deg.  Shoulder adduction      Shoulder internal rotation 74 deg. 52 deg.  Shoulder external rotation 70 deg. 49 deg.  Elbow flexion [facility] WFL  Elbow extension [facility]  Wrist flexion [facility] WFL  Wrist extension [facility] WFL  Wrist ulnar deviation      Wrist radial deviation      Wrist pronation      Wrist supination      (Blank rows = not tested)   UPPER EXTREMITY MMT:   MMT Right eval Left eval  Shoulder flexion 4 3  Shoulder extension 4+    Shoulder abduction 4 3  Shoulder adduction  Shoulder internal rotation 4+ 3  Shoulder external rotation 4 3  Middle trapezius      Lower trapezius      Elbow flexion 5 4   Elbow extension 5 4  Wrist flexion      Wrist extension      Wrist ulnar deviation      Wrist radial deviation      Wrist pronation      Wrist supination      Grip strength (lbs)      (Blank rows = not tested)   B LE AROM WFL and strength grossly 5/5 MMT except hip flexion/abduction 4/5 MMT.     SHOULDER SPECIAL TESTS: Unable to perform to L shoulder due to limitations in ROM and severe pain.     JOINT MOBILITY TESTING:  Significant L shoulder crepitus/ bony end-feel.   Catching/ locking noted in L shoulder with PROM.    PALPATION:  Minimal L shoulder tenderness with palpation.  L UT muscle tightness.    Berg balance test: 49/56  (discussed use of SPC/ stairs)             TODAY'S TREATMENT:                                                                                                                          DATE: 12/08/21   Subjective:  Pt. Reports no L shoulder pain at rest.  Pt. States she had a good weekend/ no LOB.     There.ex.:   Seated shoulder pulley: flexion/ abduction (pain tolerable range)- marked crepitus in sh.    Nustep L4 10 min. B UE/LE 10 min.  Increase resistance today.     Seated B shoulder wand ex. [employer] with PT assist on L): chest press/ shoulder flexion/ bicep curls 20x.  Standing shoulder extension/ IR.    Standing UE ex. (Reviewed HEP)   Neuro:  //-bars: forward/backwards/lateral walking with light UE assist and mirror feedback.     6" step touches (no step ups)- 20x.    Walking in clinic with no assistive device.  Good BOS/ step pattern.  Cuing for arm swing.   No manual tx. Today.       PATIENT EDUCATION: Education details: L shoulder HEP/ swim lessons Person educated: Patient Education method: Customer service manager Education comprehension: verbalized understanding and returned demonstration   HOME EXERCISE PROGRAM: Pulley/ wand ex.   ASSESSMENT:   CLINICAL IMPRESSION: Pt. Works hard during PT tx. Session.   Significant L shoulder crepitus/ catching noted during seated wand ex. With PT assist.  Focus on balance tasks today in //-bars with light no UE assist.  L shoulder ROM limited by pain and <90 deg. Flexion.  Pt. Will benefit from skilled PT services to develop HEP to increase L shoulder ROM/ strength in preparation for shoulder replacement.  Pt. Will also benefit from LE strength training/ balance tasks to improve safety/ decrease fall risk with daily mobility.  OBJECTIVE IMPAIRMENTS: Abnormal gait, cardiopulmonary status limiting activity, decreased activity tolerance, decreased balance, decreased endurance, decreased mobility, difficulty walking, decreased ROM, decreased strength, decreased safety awareness, hypomobility, impaired flexibility, improper body mechanics, postural dysfunction, obesity, and pain.    ACTIVITY LIMITATIONS: carrying, lifting, bending, standing, squatting, sleeping, stairs, transfers, bed mobility, reach over head, and locomotion level   PARTICIPATION LIMITATIONS: meal prep, cleaning, laundry, driving, and community activity   PERSONAL FACTORS: Age, Past/current experiences, and 3+ comorbidities: see above  are also affecting patient's functional outcome.    REHAB POTENTIAL: Good   CLINICAL DECISION MAKING: Evolving/moderate complexity   EVALUATION COMPLEXITY: Moderate     GOALS: Goals reviewed with patient? Yes   SHORT TERM GOALS: Target date: 12/17/21   Pt. Will be independent with HEP to increase L shoulder AROM to >90 deg. Flexion to improve ADLs. Baseline:  see above Goal status: INITIAL   2.  Pt. Will demonstrate ability to descend stairs forward with UE assist on handrail and good knee flexion/ quad control for safety.   Baseline:  descend stairs backwards with heavy UE assist Goal status: INITIAL     LONG TERM GOALS: Target date: 01/14/22   Pt. Will increase FOTO to 62 to improve functional mobility.  Baseline: initial 42 Goal status: INITIAL    2.  Pt. Will increase Berg balance test to >52 to improve safety/ decrease fall risk with community mobility.  Baseline: 49/56 (discussed use of SPC with outside walking) Goal status: INITIAL   3.  Pt. Will increase L shoulder strength 1/2 muscle grade to prepare for upcoming shoulder replacement/ improve reaching tasks.  Baseline:  see above Goal status: INITIAL   4.  Pt. Able to complete 30 minute exercise program with no increase c/o pain.   Baseline: pt. Has swimming lessons.  Currently not participating with ex. Program.   Goal status: INITIAL   PLAN:   PT FREQUENCY: 1-2x/week   PT DURATION: 8 weeks   PLANNED INTERVENTIONS: Therapeutic exercises, Therapeutic activity, Neuromuscular re-education, Balance training, Gait training, Patient/Family education, Self Care, Joint mobilization, Stair training, Electrical stimulation, Cryotherapy, Moist heat, Manual therapy, and Re-evaluation   PLAN FOR NEXT SESSION: Progress HEP    Pura Spice, PT, DPT # [id number] 12/20/2021, 7:12 AM

## 2021-12-20 NOTE — Therapy (Addendum)
OUTPATIENT PHYSICAL THERAPY SHOULDER TREATMENT   Patient Name: Darlene Chen MRN: [id number] DOB:09/13/1950, 71 y.o., female Today's Date: 12/10/2021  END OF SESSION:  PT End of Session - 12/20/21 0713     Visit Number 5    Number of Visits 16    Date for PT Re-Evaluation 01/14/22    PT Start Time 0721    PT Stop Time 0816    PT Time Calculation (min) 55 min             Past Medical History:  Diagnosis Date   Diabetes mellitus without complication ([place])    High cholesterol    Hypertension    Neutropenia (HCC)    Neutropenia (HCC)    Past Surgical History:  Procedure Laterality Date   arthroscopic knee surgery  on left 2000     BREAST BIOPSY Right 2019   bx/clip-neg   LEFT HEART CATH AND CORONARY ANGIOGRAPHY N/A 02/09/2017   Procedure: LEFT HEART CATH AND CORONARY ANGIOGRAPHY;  Surgeon: Yolonda Kida, MD;  Location: [facility];  Service: Cardiovascular;  Laterality: N/A;   REDUCTION MAMMAPLASTY Bilateral    1990   SHOULDER SURGERY Right    TUBAL LIGATION  4098   UMBILICAL HERNIA REPAIR  2007   Patient Active Problem List   Diagnosis Date Noted   Pancreatic cyst 11/21/2018   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[place]) 09/21/2018   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   Leucopenia 07/06/2018   B12 deficiency 07/06/2018   Fatty liver disease, nonalcoholic 11/91/4782   Onychomycosis 07/20/2017   Hypertensive disorder 12/03/2016   Intracranial aneurysm 12/03/2016   Family history of brain aneurysm 08/07/2016   Obstructive sleep apnea 06/17/2016   Hyperglycemia, unspecified 01/29/2016   Morbid obesity with BMI of 45.0-49.9, adult ([place]) 04/08/2015   Myalgia 11/15/2014   Costochondritis 04/11/2014   Osteopenia 04/11/2014   Prediabetes 02/15/2014   Hypercholesterolemia 09/26/2013   Degenerative arthritis of left knee 06/02/2013   PCP: Sharyne Peach, MD   REFERRING PROVIDER: Cheryl Flash, MD; Greggory Stallion Deep, NP   REFERRING DIAG:  Rotator cuff arthropathy of left shoulder                          Fear of falling                                   Gait abnormality   THERAPY DIAG:  Chronic left shoulder pain   Shoulder joint stiffness, left   Muscle weakness (generalized)   Gait difficulty   Imbalance   Rationale for Evaluation and Treatment: Rehabilitation   ONSET DATE: Chronic   SUBJECTIVE:  SUBJECTIVE STATEMENT:   EVALUATION Pt. Referred to PT for chronic L shoulder pain by Dr. Earlie Counts and for gait/ balance issues by [employer] Care, Nab Deep, NP.  Pt. Is planning for L shoulder replacement due to severity of L shoulder pain/ limitations with daily movement.  Pt. Know well to PT clinic due to R shoulder replacement/ balance/ gait and ankle issues in past.  Pt. Reports >10/10 L shoulder pain with reaching/ sudden movements.  Pt. Is R hand dominant.  Pt. States making bed is really difficulty.  No L shoulder pain at rest.  Pt. Is a R side sleeper.  Pt. Reports no falls but scared of the result of a fall.  Fearful of step ladder.     PERTINENT HISTORY: See MD notes.  Pt. Known well to PT clinic.  MD appt. For L shoulder on 01/29/22.     PAIN:  Are you having pain? Yes: NPRS scale: 10/10 Pain location: L shoulder Pain description: sharp Aggravating factors: reaching/ sudden movements Relieving factors: rest   PRECAUTIONS: Fall   WEIGHT BEARING RESTRICTIONS: No   FALLS:  Has patient fallen in last 6 months? No   LIVING ENVIRONMENT: Lives with: lives with their spouse Lives in: House/apartment Has following equipment at home: Single point cane   OCCUPATION: Retired.     PLOF: Independent   PATIENT GOALS:  Increase L shoulder ROM/ strength (prepare for shoulder replacement) and improve gait/balance.    NEXT MD VISIT: 01/29/22    OBJECTIVE:    DIAGNOSTIC FINDINGS:  Bones/Joint/Cartilage   No fracture or dislocation. Normal alignment. Small joint effusion.   Severe osteoarthritis of the glenohumeral joint with severe joint space narrowing, a bone-on-bone appearance, subchondral sclerosis, subchondral cystic changes and marginal osteophytosis.   Mild arthropathy of the acromioclavicular joint.   Ligaments   Ligaments are suboptimally evaluated by CT.   Muscles and Tendons No significant muscle atrophy. No intramuscular fluid collection or hematoma. Rotator cuff is suboptimally evaluated by CT without intra-articular contrast.   Soft tissue No fluid collection or hematoma. No soft tissue mass. Visualized portions of the lung are clear. Thoracic aortic atherosclerosis.   IMPRESSION: 1. Severe osteoarthritis of the left glenohumeral joint. 2. Aortic Atherosclerosis (ICD10-I70.0).     PATIENT SURVEYS:  FOTO initial 41/ goal 50   COGNITION: Overall cognitive status: Within functional limits for tasks assessed                                  SENSATION: WFL   POSTURE: Rounder shoulder/ forward posture in sitting and standing   UPPER EXTREMITY ROM:    Active ROM Right eval Left eval  Shoulder flexion 134 deg 84 deg.  Shoulder extension      Shoulder abduction 132 deg. 70 deg.  Shoulder adduction      Shoulder internal rotation 74 deg. 52 deg.  Shoulder external rotation 70 deg. 49 deg.  Elbow flexion [facility] WFL  Elbow extension [facility]  Wrist flexion [facility] WFL  Wrist extension [facility] WFL  Wrist ulnar deviation      Wrist radial deviation      Wrist pronation      Wrist supination      (Blank rows = not tested)   UPPER EXTREMITY MMT:   MMT Right eval Left eval  Shoulder flexion 4 3  Shoulder extension 4+    Shoulder abduction 4 3  Shoulder adduction  Shoulder internal rotation 4+ 3  Shoulder external rotation 4 3  Middle trapezius      Lower trapezius      Elbow flexion 5 4   Elbow extension 5 4  Wrist flexion      Wrist extension      Wrist ulnar deviation      Wrist radial deviation      Wrist pronation      Wrist supination      Grip strength (lbs)      (Blank rows = not tested)   B LE AROM WFL and strength grossly 5/5 MMT except hip flexion/abduction 4/5 MMT.     SHOULDER SPECIAL TESTS: Unable to perform to L shoulder due to limitations in ROM and severe pain.     JOINT MOBILITY TESTING:  Significant L shoulder crepitus/ bony end-feel.   Catching/ locking noted in L shoulder with PROM.    PALPATION:  Minimal L shoulder tenderness with palpation.  L UT muscle tightness.    Berg balance test: 49/56  (discussed use of SPC/ stairs)             TODAY'S TREATMENT:                                                                                                                          DATE: 12/10/21   Subjective:  Pt. Had her 3rd swim lesson.  No c/o pain prior to PT tx. Session.     There.ex.:    Nustep L4 10 min. B UE/LE 10 min.  Discussed swim lessons.    Seated B shoulder wand ex. [employer] with PT assist on L): chest press/ shoulder flexion/ bicep curls 20x.  Standing shoulder extension/ IR.     Standing wall ladder with PT assist at L elbow due to sh. Crepitus/ instability.    Nautilus: seated 40# lat. Pull downs/ standing 40# scap. Retraction/ standing 30# tricep extension 30x each with use of wand.     Neuro:   //-bars: forward/backwards/lateral walking with light to no UE assist.  Slight sway note without UE assist.    STS from chair without UE assist.  10x.    Walking cone taps in //-bars/ alt. UE and LE touches with cuing to correct posture.  No LOB   Walking in clinic with no assistive device.  Good BOS/ step pattern.  Cuing for arm swing.   No manual tx. Today.       PATIENT EDUCATION: Education details: L shoulder HEP/ swim lessons Person educated: Patient Education method: Customer service manager Education  comprehension: verbalized understanding and returned demonstration   HOME EXERCISE PROGRAM: Pulley/ wand ex.   ASSESSMENT:   CLINICAL IMPRESSION: Pt. Challenged with walking cone taps in //-bars.  Marked lateral sway noted while walking without UE assist. Significant L shoulder crepitus/ catching noted during seated wand ex. With PT assist.  Pt. Able to maintain standing balance during standing Nautilus ex.  No improvement in L  shoulder AROM.  Pt. Will benefit from skilled PT services to develop HEP to increase L shoulder ROM/ strength in preparation for shoulder replacement.  Pt. Will also benefit from LE strength training/ balance tasks to improve safety/ decrease fall risk with daily mobility.     OBJECTIVE IMPAIRMENTS: Abnormal gait, cardiopulmonary status limiting activity, decreased activity tolerance, decreased balance, decreased endurance, decreased mobility, difficulty walking, decreased ROM, decreased strength, decreased safety awareness, hypomobility, impaired flexibility, improper body mechanics, postural dysfunction, obesity, and pain.    ACTIVITY LIMITATIONS: carrying, lifting, bending, standing, squatting, sleeping, stairs, transfers, bed mobility, reach over head, and locomotion level   PARTICIPATION LIMITATIONS: meal prep, cleaning, laundry, driving, and community activity   PERSONAL FACTORS: Age, Past/current experiences, and 3+ comorbidities: see above  are also affecting patient's functional outcome.    REHAB POTENTIAL: Good   CLINICAL DECISION MAKING: Evolving/moderate complexity   EVALUATION COMPLEXITY: Moderate     GOALS: Goals reviewed with patient? Yes   SHORT TERM GOALS: Target date: 12/17/21   Pt. Will be independent with HEP to increase L shoulder AROM to >90 deg. Flexion to improve ADLs. Baseline:  see above Goal status: INITIAL   2.  Pt. Will demonstrate ability to descend stairs forward with UE assist on handrail and good knee flexion/ quad control for  safety.   Baseline:  descend stairs backwards with heavy UE assist Goal status: INITIAL     LONG TERM GOALS: Target date: 01/14/22   Pt. Will increase FOTO to 62 to improve functional mobility.  Baseline: initial 42 Goal status: INITIAL   2.  Pt. Will increase Berg balance test to >52 to improve safety/ decrease fall risk with community mobility.  Baseline: 49/56 (discussed use of SPC with outside walking) Goal status: INITIAL   3.  Pt. Will increase L shoulder strength 1/2 muscle grade to prepare for upcoming shoulder replacement/ improve reaching tasks.  Baseline:  see above Goal status: INITIAL   4.  Pt. Able to complete 30 minute exercise program with no increase c/o pain.   Baseline: pt. Has swimming lessons.  Currently not participating with ex. Program.   Goal status: INITIAL   PLAN:   PT FREQUENCY: 1-2x/week   PT DURATION: 8 weeks   PLANNED INTERVENTIONS: Therapeutic exercises, Therapeutic activity, Neuromuscular re-education, Balance training, Gait training, Patient/Family education, Self Care, Joint mobilization, Stair training, Electrical stimulation, Cryotherapy, Moist heat, Manual therapy, and Re-evaluation   PLAN FOR NEXT SESSION: Reassess STG/ shoulder ROM    Pura Spice, PT, DPT # [phone] 12/20/2021, 7:15 AM

## 2021-12-22 ENCOUNTER — Encounter: Payer: Medicare HMO | Admitting: Physical Therapy

## 2021-12-23 ENCOUNTER — Other Ambulatory Visit: Payer: Self-pay

## 2021-12-23 DIAGNOSIS — L659 Nonscarring hair loss, unspecified: Secondary | ICD-10-CM

## 2021-12-23 DIAGNOSIS — R928 Other abnormal and inconclusive findings on diagnostic imaging of breast: Secondary | ICD-10-CM

## 2021-12-24 ENCOUNTER — Ambulatory Visit: Payer: Medicare HMO | Admitting: Physical Therapy

## 2021-12-26 NOTE — Addendum Note (Signed)
Addended by: Pura Spice on: 12/26/2021 07:33 AM   Modules accepted: Orders

## 2022-01-05 HISTORY — PX: TOTAL SHOULDER REPLACEMENT: SUR1217

## 2022-01-07 ENCOUNTER — Encounter: Payer: Self-pay | Admitting: Physical Therapy

## 2022-01-07 ENCOUNTER — Ambulatory Visit: Payer: Medicare HMO | Attending: Nurse Practitioner | Admitting: Physical Therapy

## 2022-01-07 DIAGNOSIS — M25512 Pain in left shoulder: Secondary | ICD-10-CM | POA: Insufficient documentation

## 2022-01-07 DIAGNOSIS — M6281 Muscle weakness (generalized): Secondary | ICD-10-CM

## 2022-01-07 DIAGNOSIS — R269 Unspecified abnormalities of gait and mobility: Secondary | ICD-10-CM | POA: Diagnosis present

## 2022-01-07 DIAGNOSIS — R52 Pain, unspecified: Secondary | ICD-10-CM | POA: Diagnosis present

## 2022-01-07 DIAGNOSIS — G8929 Other chronic pain: Secondary | ICD-10-CM | POA: Diagnosis present

## 2022-01-07 DIAGNOSIS — R2689 Other abnormalities of gait and mobility: Secondary | ICD-10-CM

## 2022-01-07 DIAGNOSIS — M25612 Stiffness of left shoulder, not elsewhere classified: Secondary | ICD-10-CM

## 2022-01-07 NOTE — Therapy (Signed)
OUTPATIENT PHYSICAL THERAPY SHOULDER TREATMENT/DISCHARGE   Patient Name: Darlene Chen MRN: [id number] DOB:1950/01/10, 72 y.o., female Today's Date: 01/07/2022  END OF SESSION:  PT End of Session - 01/07/22 1117     Visit Number 7    Number of Visits 16    Date for PT Re-Evaluation 01/14/22    PT Start Time 1117    PT Stop Time 1148    PT Time Calculation (min) 31 min             Past Medical History:  Diagnosis Date   Diabetes mellitus without complication ([place])    High cholesterol    Hypertension    Neutropenia (HCC)    Neutropenia (HCC)    Past Surgical History:  Procedure Laterality Date   arthroscopic knee surgery  on left 2000     BREAST BIOPSY Right 2019   bx/clip-neg   LEFT HEART CATH AND CORONARY ANGIOGRAPHY N/A 02/09/2017   Procedure: LEFT HEART CATH AND CORONARY ANGIOGRAPHY;  Surgeon: Yolonda Kida, MD;  Location: [facility];  Service: Cardiovascular;  Laterality: N/A;   REDUCTION MAMMAPLASTY Bilateral    1990   SHOULDER SURGERY Right    TUBAL LIGATION  0623   UMBILICAL HERNIA REPAIR  2007   Patient Active Problem List   Diagnosis Date Noted   Pancreatic cyst 11/21/2018   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[place]) 09/21/2018   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   Leucopenia 07/06/2018   B12 deficiency 07/06/2018   Fatty liver disease, nonalcoholic 76/28/3151   Onychomycosis 07/20/2017   Hypertensive disorder 12/03/2016   Intracranial aneurysm 12/03/2016   Family history of brain aneurysm 08/07/2016   Obstructive sleep apnea 06/17/2016   Hyperglycemia, unspecified 01/29/2016   Morbid obesity with BMI of 45.0-49.9, adult ([place]) 04/08/2015   Myalgia 11/15/2014   Costochondritis 04/11/2014   Osteopenia 04/11/2014   Prediabetes 02/15/2014   Hypercholesterolemia 09/26/2013   Degenerative arthritis of left knee 06/02/2013   PCP: Sharyne Peach, MD   REFERRING PROVIDER: Cheryl Flash, MD; Greggory Stallion Deep, NP   REFERRING  DIAG: Rotator cuff arthropathy of left shoulder                          Fear of falling                                   Gait abnormality   THERAPY DIAG:  Chronic left shoulder pain   Shoulder joint stiffness, left   Muscle weakness (generalized)   Gait difficulty   Imbalance   Rationale for Evaluation and Treatment: Rehabilitation   ONSET DATE: Chronic   SUBJECTIVE:  SUBJECTIVE STATEMENT:   EVALUATION Pt. Referred to PT for chronic L shoulder pain by Dr. Earlie Counts and for gait/ balance issues by [employer], Nab Deep, NP.  Pt. Is planning for L shoulder replacement due to severity of L shoulder pain/ limitations with daily movement.  Pt. Know well to PT clinic due to R shoulder replacement/ balance/ gait and ankle issues in past.  Pt. Reports >10/10 L shoulder pain with reaching/ sudden movements.  Pt. Is R hand dominant.  Pt. States making bed is really difficulty.  No L shoulder pain at rest.  Pt. Is a R side sleeper.  Pt. Reports no falls but scared of the result of a fall.  Fearful of step ladder.     PERTINENT HISTORY: See MD notes.  Pt. Known well to PT clinic.  MD appt. For L shoulder on 01/29/22.     PAIN:  Are you having pain? Yes: NPRS scale: 10/10 Pain location: L shoulder Pain description: sharp Aggravating factors: reaching/ sudden movements Relieving factors: rest   PRECAUTIONS: Fall   WEIGHT BEARING RESTRICTIONS: No   FALLS:  Has patient fallen in last 6 months? No   LIVING ENVIRONMENT: Lives with: lives with their spouse Lives in: House/apartment Has following equipment at home: Single point cane   OCCUPATION: Retired.     PLOF: Independent   PATIENT GOALS:  Increase L shoulder ROM/ strength (prepare for shoulder replacement) and improve gait/balance.    NEXT MD VISIT: 01/29/22    OBJECTIVE:    DIAGNOSTIC FINDINGS:  Bones/Joint/Cartilage   No fracture or dislocation. Normal alignment. Small joint effusion.   Severe osteoarthritis of the glenohumeral joint with severe joint space narrowing, a bone-on-bone appearance, subchondral sclerosis, subchondral cystic changes and marginal osteophytosis.   Mild arthropathy of the acromioclavicular joint.   Ligaments   Ligaments are suboptimally evaluated by CT.   Muscles and Tendons No significant muscle atrophy. No intramuscular fluid collection or hematoma. Rotator cuff is suboptimally evaluated by CT without intra-articular contrast.   Soft tissue No fluid collection or hematoma. No soft tissue mass. Visualized portions of the lung are clear. Thoracic aortic atherosclerosis.   IMPRESSION: 1. Severe osteoarthritis of the left glenohumeral joint. 2. Aortic Atherosclerosis (ICD10-I70.0).     PATIENT SURVEYS:  FOTO initial 41/ goal 50   COGNITION: Overall cognitive status: Within functional limits for tasks assessed                                  SENSATION: WFL   POSTURE: Rounder shoulder/ forward posture in sitting and standing   UPPER EXTREMITY ROM:    Active ROM Right eval Left eval  Shoulder flexion 134 deg 84 deg.  Shoulder extension      Shoulder abduction 132 deg. 70 deg.  Shoulder adduction      Shoulder internal rotation 74 deg. 52 deg.  Shoulder external rotation 70 deg. 49 deg.  Elbow flexion [facility] WFL  Elbow extension [facility]  Wrist flexion [facility] WFL  Wrist extension [facility] WFL  Wrist ulnar deviation      Wrist radial deviation      Wrist pronation      Wrist supination      (Blank rows = not tested)   UPPER EXTREMITY MMT:   MMT Right eval Left eval  Shoulder flexion 4 3  Shoulder extension 4+    Shoulder abduction 4 3  Shoulder adduction  Shoulder internal rotation 4+ 3  Shoulder external rotation 4 3  Middle trapezius      Lower trapezius      Elbow flexion 5 4   Elbow extension 5 4  Wrist flexion      Wrist extension      Wrist ulnar deviation      Wrist radial deviation      Wrist pronation      Wrist supination      Grip strength (lbs)      (Blank rows = not tested)   B LE AROM WFL and strength grossly 5/5 MMT except hip flexion/abduction 4/5 MMT.     SHOULDER SPECIAL TESTS: Unable to perform to L shoulder due to limitations in ROM and severe pain.     JOINT MOBILITY TESTING:  Significant L shoulder crepitus/ bony end-feel.   Catching/ locking noted in L shoulder with PROM.    PALPATION:  Minimal L shoulder tenderness with palpation.  L UT muscle tightness.    Berg balance test: 49/56  (discussed use of SPC/ stairs)              TODAY'S TREATMENT:                                                                                                                       DATE: 01/07/22   Subjective:  Pt. Reports no new complaints.  Pt. Scheduled for L total shoulder replacement on 01/29/22.  Pt. Will continue with HEP and contact PT when MD releases pt. To start PT.      There.ex.:    Balance FOTO: 48 Shoulder FOTO: 43 QuickDASH: 70.5%  Reassessment of shoulder ROM/ strength.  Grip strength: L 59.8#, R 41.2#.   Standing L/R shoulder flexion (102/144 deg.), abduction (81/132 deg.).  Marked improvement in ROM.  L shoulder has significant crepitus/ pain with active range.  Updated MMT.    No Nautilus today secondary to significant L shoulder crepitus.     Neuro:   Reviewed balance goals   6" step touches: toe and heel 10x2 each in //-bars.     Walking to car without use of SPC and consistent step pattern.      No manual tx. Today.       PATIENT EDUCATION: Education details: L shoulder HEP/ swim lessons Person educated: Patient Education method: Customer service manager Education comprehension: verbalized understanding and returned demonstration   HOME EXERCISE PROGRAM: Pulley/ wand ex.   ASSESSMENT:   CLINICAL  IMPRESSION: Significant L shoulder crepitus/ catching during all AROM in seated/ standing posture With PT assist.  PT reassessed all goals and pt. Will be discharged from PT at this time until after L total shoulder replacement.      OBJECTIVE IMPAIRMENTS: Abnormal gait, cardiopulmonary status limiting activity, decreased activity tolerance, decreased balance, decreased endurance, decreased mobility, difficulty walking, decreased ROM, decreased strength, decreased safety awareness, hypomobility, impaired flexibility, improper body mechanics, postural dysfunction, obesity, and pain.    ACTIVITY LIMITATIONS:  carrying, lifting, bending, standing, squatting, sleeping, stairs, transfers, bed mobility, reach over head, and locomotion level   PARTICIPATION LIMITATIONS: meal prep, cleaning, laundry, driving, and community activity   PERSONAL FACTORS: Age, Past/current experiences, and 3+ comorbidities: see above  are also affecting patient's functional outcome.    REHAB POTENTIAL: Good   CLINICAL DECISION MAKING: Evolving/moderate complexity   EVALUATION COMPLEXITY: Moderate     GOALS: Goals reviewed with patient? Yes   SHORT TERM GOALS: Target date: 12/17/21   Pt. Will be independent with HEP to increase L shoulder AROM to >90 deg. Flexion to improve ADLs. Baseline:  see above.  12/11: <90 deg. Flexion.  01/07/22: see above Goal status: Not met   2.  Pt. Will demonstrate ability to descend stairs forward with UE assist on handrail and good knee flexion/ quad control for safety.   Baseline:  descend stairs backwards with heavy UE assist Goal status: Partially met     LONG TERM GOALS: Target date: 01/14/22   Pt. Will increase FOTO to 62 to improve functional mobility.  Baseline: initial 42.  01/07/22: see above Goal status: Not met   2.  Pt. Will increase Berg balance test to >52 to improve safety/ decrease fall risk with community mobility.  Baseline: 49/56 (discussed use of SPC with  outside walking).  1/3: 50/56 Goal status: Not met   3.  Pt. Will increase L shoulder strength 1/2 muscle grade to prepare for upcoming shoulder replacement/ improve reaching tasks.  Baseline:  see above Goal status: Partially met   4.  Pt. Able to complete 30 minute exercise program with no increase c/o pain.   Baseline: pt. Has swimming lessons.  Currently not participating with ex. Program.   Goal status: Partially met   PLAN:   PT FREQUENCY: 1-2x/week   PT DURATION: 8 weeks   PLANNED INTERVENTIONS: Therapeutic exercises, Therapeutic activity, Neuromuscular re-education, Balance training, Gait training, Patient/Family education, Self Care, Joint mobilization, Stair training, Electrical stimulation, Cryotherapy, Moist heat, Manual therapy, and Re-evaluation   PLAN FOR NEXT SESSION: Discharge from PT at this time.     Pura Spice, PT, DPT # [id number] 01/07/2022, 12:26 PM

## 2022-01-14 ENCOUNTER — Ambulatory Visit
Admission: RE | Admit: 2022-01-14 | Discharge: 2022-01-14 | Disposition: A | Discharge status: Home / Self Care | Payer: Medicare HMO | Source: Ambulatory Visit | Attending: Family Medicine | Admitting: Family Medicine

## 2022-01-14 DIAGNOSIS — L659 Nonscarring hair loss, unspecified: Secondary | ICD-10-CM

## 2022-01-14 DIAGNOSIS — R928 Other abnormal and inconclusive findings on diagnostic imaging of breast: Secondary | ICD-10-CM | POA: Insufficient documentation

## 2022-02-20 ENCOUNTER — Ambulatory Visit (INDEPENDENT_AMBULATORY_CARE_PROVIDER_SITE_OTHER): Payer: Medicare HMO

## 2022-02-20 ENCOUNTER — Ambulatory Visit
Admission: EM | Admit: 2022-02-20 | Discharge: 2022-02-20 | Disposition: A | Discharge status: Home / Self Care | Payer: Medicare HMO | Attending: Physician Assistant | Admitting: Physician Assistant

## 2022-02-20 ENCOUNTER — Encounter: Payer: Self-pay | Admitting: Emergency Medicine

## 2022-02-20 DIAGNOSIS — M25511 Pain in right shoulder: Secondary | ICD-10-CM

## 2022-02-20 DIAGNOSIS — M19011 Primary osteoarthritis, right shoulder: Secondary | ICD-10-CM | POA: Diagnosis not present

## 2022-02-20 DIAGNOSIS — M7521 Bicipital tendinitis, right shoulder: Secondary | ICD-10-CM | POA: Diagnosis not present

## 2022-02-20 NOTE — Discharge Instructions (Signed)
-  Your symptoms are consistent with biceps tendinitis.  See handout. - Take Tylenol as needed for pain relief and use Voltaren gel.  You may also use heat, ice and muscle rubs. - Follow-up with PCP, physical therapy and orthopedics if no improvement over the next week or if symptoms acutely worsen.

## 2022-02-20 NOTE — ED Provider Notes (Signed)
[facility]    CSN: [id number] Arrival date & time: 02/20/22  1307      History   Chief Complaint Chief Complaint  Patient presents with   Shoulder Pain    right    HPI Darlene Chen is a 72 y.o. female presenting for 2-day history of right anterior shoulder pain with radiation into the right biceps.  Denies injury.  No recent change in activity.  She says that she has known arthritis in the shoulder and has had a surgery on it a couple years ago.  Denies any numbness, weakness or tingling, pain in chest or back.  No neck pain.  Patient reports she is not really having pain at this time and it comes and goes with certain movements like reaching forward.  She does have full range of motion of her shoulder.  Patient would like an x-ray.  No other complaints or concerns.  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   Leucopenia 07/06/2018   B12 deficiency 07/06/2018   Fatty liver disease, nonalcoholic 0000000   Onychomycosis 07/20/2017   Hypertensive disorder 12/03/2016   Intracranial aneurysm 12/03/2016   Family history of brain aneurysm 08/07/2016   Obstructive sleep apnea 06/17/2016   Hyperglycemia, unspecified 01/29/2016   Morbid obesity with BMI of 45.0-49.9, adult ([place]) 04/08/2015   Myalgia 11/15/2014   Costochondritis 04/11/2014   Osteopenia 04/11/2014   Prediabetes 02/15/2014   Hypercholesterolemia 09/26/2013   Degenerative arthritis of left knee 06/02/2013    Past Surgical History:  Procedure Laterality Date   arthroscopic knee surgery  on left 2000     BREAST BIOPSY Right 2019   bx/clip-neg   LEFT HEART CATH AND CORONARY ANGIOGRAPHY N/A  02/09/2017   Procedure: LEFT HEART CATH AND CORONARY ANGIOGRAPHY;  Surgeon: Yolonda Kida, MD;  Location: [facility];  Service: Cardiovascular;  Laterality: N/A;   REDUCTION MAMMAPLASTY Bilateral    1990   SHOULDER SURGERY Right    TUBAL LIGATION  [id number]   UMBILICAL HERNIA REPAIR  2007    OB History   No obstetric history on file.      Home Medications    Prior to Admission medications   Medication Sig Start Date End Date Taking? Authorizing Provider  amLODipine (NORVASC) 10 MG tablet Take 10 mg by mouth daily.   Yes [provider]  hydrochlorothiazide (HYDRODIURIL) 12.5 MG tablet Take 12.5 mg by mouth daily.   Yes [provider]  metFORMIN (GLUCOPHAGE) 500 MG tablet Take 500 mg by mouth 2 (two) times daily.   Yes [provider]  metoprolol succinate (TOPROL-XL) 25 MG 24 hr tablet Take 25 mg by mouth daily.   Yes [provider]  acetaminophen (TYLENOL) 500 MG tablet Take 1,500 mg by mouth every 6 (six) hours as needed.    [provider]  ammonium lactate (LAC-HYDRIN) 12 % lotion Apply 1 application. topically as needed. 10/26/19   [provider]  cycloSPORINE (RESTASIS) 0.05 % ophthalmic emulsion Place 1 drop into both eyes 2 (two) times daily as needed.     [provider]  diclofenac sodium (VOLTAREN) 1 % GEL Apply 1 application  topically as needed. 10/26/17   [provider]  Evolocumab with Infusor ([place]) 420 MG/3.5ML SOCT Inject 1 Dose into the skin every 30 (thirty) days. 03/05/20   [provider]  fluocinonide cream (LIDEX) AB-123456789 % Apply 1 application topically 2 (two) times daily as needed. For eczema 12/28/16   [provider]  LORazepam (ATIVAN) 0.5 MG tablet Take 1 tab 15-20 minutes prior to procedure. Patient not taking: Reported on 06/04/2021 06/27/18   [provider]  tiZANidine (ZANAFLEX) 4 MG tablet Take 4 mg by mouth 3 (three) times  daily as needed. 04/11/20   [provider]    Family History Family History  Problem Relation Age of Onset   Breast cancer Sister 50    Social History Social History   Tobacco Use   Smoking status: Never   Smokeless tobacco: Never  Vaping Use   Vaping Use: Never used  Substance Use Topics   Alcohol use: No   Drug use: No     Allergies   Colesevelam, Biaxin [clarithromycin], Niacin, Statins, Sulfa antibiotics, Sulfur dioxide, Tramadol, and Ezetimibe   Review of Systems Review of Systems  Respiratory:  Negative for shortness of breath.   Cardiovascular:  Negative for chest pain and palpitations.  Musculoskeletal:  Positive for arthralgias. Negative for back pain, joint swelling, neck pain and neck stiffness.  Skin:  Negative for rash.  Neurological:  Negative for weakness and numbness.     Physical Exam Triage Vital Signs ED Triage Vitals  Enc Vitals Group     BP      Pulse      Resp      Temp      Temp src      SpO2      Weight      Height      Head Circumference      Peak Flow      Pain Score      Pain Loc      Pain Edu?      Excl. in [place]?    No data found.  Updated Vital Signs BP (!) 151/76 (BP Location: Right Arm)   Pulse 78   Temp 97.8 F (36.6 C) (Oral)   Resp 14   Ht 5' (1.524 m)   Wt 227 lb (103 kg)   SpO2 98%   BMI 44.33 kg/m   Physical Exam Vitals and nursing note reviewed.  Constitutional:      General: She is not in acute distress.    Appearance: Normal appearance. She is not ill-appearing or toxic-appearing.  HENT:     Head: Normocephalic and atraumatic.  Eyes:     General: No scleral icterus.       Right eye: No discharge.        Left eye: No discharge.     Conjunctiva/sclera: Conjunctivae normal.  Cardiovascular:     Rate and Rhythm: Normal rate and regular rhythm.     Heart sounds: Normal heart sounds.  Pulmonary:     Effort: Pulmonary effort is normal. No respiratory distress.     Breath sounds: Normal breath  sounds.  Chest:     Chest wall: No tenderness.  Musculoskeletal:     Right shoulder: Tenderness (TTP biceps groove) present. No swelling, deformity or bony tenderness. Normal range of motion. Normal strength. Normal pulse.     Cervical back: Neck supple.     Comments: Pain with Speed's test.  Skin:    General:  Skin is dry.  Neurological:     General: No focal deficit present.     Mental Status: She is alert. Mental status is at baseline.     Motor: No weakness.     Gait: Gait normal.  Psychiatric:        Mood and Affect: Mood normal.        Behavior: Behavior normal.        Thought Content: Thought content normal.      UC Treatments / Results  Labs (all labs ordered are listed, but only abnormal results are displayed) Labs Reviewed - No data to display  EKG   Radiology No results found.  Procedures Procedures (including critical care time)  Medications Ordered in UC Medications - No data to display  Initial Impression / Assessment and Plan / UC Course  I have reviewed the triage vital signs and the nursing notes.  Pertinent labs & imaging results that were available during my care of the patient were reviewed by me and considered in my medical decision making (see chart for details).   72 year old female with history of osteoarthritis of her right shoulder and previous surgery presents for 2-day history of right shoulder pain.  Patient says pain is mild and comes and goes with certain movements like reaching forward.  No numbness, weakness or tingling.  No injury.  X-ray of right shoulder obtained at patient request but advised her that based on her physical exam her symptoms were consistent with biceps tendinitis.  No acute abnormality. Reviewed results of imaging with patient. Suspect biceps tendinitis.  Reviewed RICE guidelines and Tylenol for pain relief.  Follow-up with PCP and physical therapy if no improvement in symptoms over the next week.  ED for any severe  pain.   Final Clinical Impressions(s) / UC Diagnoses   Final diagnoses:  Biceps tendinitis of right upper extremity  Primary osteoarthritis of right shoulder     Discharge Instructions      -Your symptoms are consistent with biceps tendinitis.  See handout. - Take Tylenol as needed for pain relief and use Voltaren gel.  You may also use heat, ice and muscle rubs. - Follow-up with PCP, physical therapy and orthopedics if no improvement over the next week or if symptoms acutely worsen.     ED Prescriptions   None    I have reviewed the PDMP during this encounter.   Danton Clap, PA-C 02/20/22 [phone]

## 2022-02-20 NOTE — ED Triage Notes (Signed)
Patient c/o right shoulder pain that started 2 days ago.  Patient denies injury or fall. Patient states that her pain is worse with movement.

## 2022-04-20 ENCOUNTER — Ambulatory Visit: Payer: Medicare HMO | Admitting: Physical Therapy

## 2022-04-23 ENCOUNTER — Encounter: Payer: Medicare HMO | Admitting: Physical Therapy

## 2022-04-28 ENCOUNTER — Encounter: Payer: Medicare HMO | Admitting: Physical Therapy

## 2022-04-30 ENCOUNTER — Encounter: Payer: Medicare HMO | Admitting: Physical Therapy

## 2022-05-05 ENCOUNTER — Ambulatory Visit: Payer: Medicare HMO | Attending: Orthopedic Surgery | Admitting: Physical Therapy

## 2022-05-05 ENCOUNTER — Encounter: Payer: Self-pay | Admitting: Physical Therapy

## 2022-05-05 DIAGNOSIS — M25612 Stiffness of left shoulder, not elsewhere classified: Secondary | ICD-10-CM | POA: Diagnosis present

## 2022-05-05 DIAGNOSIS — G8929 Other chronic pain: Secondary | ICD-10-CM | POA: Diagnosis present

## 2022-05-05 DIAGNOSIS — M25512 Pain in left shoulder: Secondary | ICD-10-CM | POA: Diagnosis present

## 2022-05-05 DIAGNOSIS — M6281 Muscle weakness (generalized): Secondary | ICD-10-CM

## 2022-05-05 DIAGNOSIS — Z96612 Presence of left artificial shoulder joint: Secondary | ICD-10-CM

## 2022-05-05 NOTE — Therapy (Addendum)
OUTPATIENT PHYSICAL THERAPY SHOULDER EVALUATION   Patient Name: Darlene Chen MRN: [id number] DOB:02/13/1950, 72 y.o., female Today's Date: 05/05/2022  END OF SESSION:  PT End of Session - 05/05/22 1401     Visit Number 1    Number of Visits 16    Date for PT Re-Evaluation 06/30/22    PT Start Time 1354    PT Stop Time 1440    PT Time Calculation (min) 46 min             Past Medical History:  Diagnosis Date   Diabetes mellitus without complication ([facility])    High cholesterol    Hypertension    Neutropenia ([facility])    Neutropenia ([facility])    Past Surgical History:  Procedure Laterality Date   arthroscopic knee surgery  on left 2000     BREAST BIOPSY Right 2019   bx/clip-neg   LEFT HEART CATH AND CORONARY ANGIOGRAPHY N/A 02/09/2017   Procedure: LEFT HEART CATH AND CORONARY ANGIOGRAPHY;  Surgeon: Alwyn Pea, MD;  Location: [facility];  Service: Cardiovascular;  Laterality: N/A;   REDUCTION MAMMAPLASTY Bilateral    1990   SHOULDER SURGERY Right    TUBAL LIGATION  1985   UMBILICAL HERNIA REPAIR  2007   Patient Active Problem List   Diagnosis Date Noted   Pancreatic cyst 11/21/2018   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[facility]) 09/21/2018   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   Leucopenia 07/06/2018   B12 deficiency 07/06/2018   Fatty liver disease, nonalcoholic 12/12/2017   Onychomycosis 07/20/2017   Hypertensive disorder 12/03/2016   Intracranial aneurysm 12/03/2016   Family history of brain aneurysm 08/07/2016   Obstructive sleep apnea 06/17/2016   Hyperglycemia, unspecified 01/29/2016   Morbid obesity with BMI of 45.0-49.9, adult ([facility]) 04/08/2015   Myalgia 11/15/2014   Costochondritis 04/11/2014   Osteopenia 04/11/2014   Prediabetes 02/15/2014   Hypercholesterolemia 09/26/2013   Degenerative arthritis of left knee 06/02/2013   PCP: Rayetta Humphrey, MD  REFERRING PROVIDER: Holland Commons, MD  REFERRING DIAG: s/p L reverse total  shoulder replacement  THERAPY DIAG:  Status post reverse total shoulder replacement, left  Chronic left shoulder pain  Shoulder joint stiffness, left  Muscle weakness (generalized)  Rationale for Evaluation and Treatment: Rehabilitation  ONSET DATE: 04/14/22  SUBJECTIVE:                                                                                                                                                                                      SUBJECTIVE STATEMENT: Pt. S/p L reverse total shoulder replacement on 04/14/22.  Pt. Had previous R total shoulder.  Pt. Reports no L shoulder pain at  rest and 7-8/10 pain with movement.  Pt. Currently wearing a sling and slowly weaning out of sling with home activities.  Pt. Is sleeping in recliner due to difficulty getting comfortable at night.   Hand dominance: Right  PERTINENT HISTORY: Pt. Well known to PT clinic.  See previous PT/MD notes.   PAIN:  Are you having pain? Yes: NPRS scale: 7/10 Pain location: L shoulder Pain description: aching/ sharp Aggravating factors: PROM Relieving factors: rest/ ice  PRECAUTIONS: Shoulder  WEIGHT BEARING RESTRICTIONS: No  FALLS:  Has patient fallen in last 6 months? No  LIVING ENVIRONMENT: Lives with: lives with their spouse Lives in: House/apartment Has following equipment at home: Single point cane  OCCUPATION: Retired  PLOF: Independent  PATIENT GOALS:  Increase L shoulder ROM/ strength/ pain-free mobility  NEXT MD VISIT: approx. 6 weeks.  OBJECTIVE:   DIAGNOSTIC FINDINGS:  See imaging  PATIENT SURVEYS:  FOTO initial 42/ goal 60  COGNITION: Overall cognitive status: Within functional limits for tasks assessed     SENSATION: WFL  POSTURE: Rounded shoulders.    UPPER EXTREMITY ROM:   Passive ROM Right eval Left eval  Shoulder flexion 146 deg. 95 deg.  Shoulder extension  NT  Shoulder abduction 125 deg. 80 deg.  Shoulder adduction    Shoulder internal  rotation 75 deg. 30 deg.  Shoulder external rotation 75 deg. 0 deg.  Elbow flexion [facility] WFL  Elbow extension [facility] WFL  Wrist flexion    Wrist extension    Wrist ulnar deviation    Wrist radial deviation    Wrist pronation    Wrist supination    (Blank rows = not tested)  UPPER EXTREMITY MMT:   TBD when appropriate.  See MD protocol.  Grip strength: R 52#/ L 33#   SHOULDER SPECIAL TESTS: N/A  JOINT MOBILITY TESTING:  N/A  PALPATION:  Minimal tenderness around incision.  Incision is scabby/ dry and has 3 area of drainage (clear)- see picture.  Pt. Instructed to contact MD if incision worsens.    TODAY'S TREATMENT:                                                                                                                                         DATE: 05/05/22  Evaluation/ discussed current HHPT ex. Program.     PATIENT EDUCATION: Education details: HEP/ incision healing/care Person educated: Patient Education method: Medical illustrator Education comprehension: verbalized understanding and returned demonstration  HOME EXERCISE PROGRAM: Will issue handouts next tx. session  ASSESSMENT:  CLINICAL IMPRESSION: Patient is a pleasant 72 y.o. female who was seen today for physical therapy evaluation and treatment for L reverse total shoulder replacement.  Pt. Presents with moderate L shoulder pain with gentle PROM (see above) and understands MD protocol.  Pt. Currently weaning out of sling with home tasks and donning sling outside of house.  Pt. Unable to sleep in  bed at this time and is sleeping in recliner.  Pt. Presents with slow healing incision with drainage noted at 3 spots/ steristrips in place.  Pt. Will benefit from skilled PT services to increase L shoulder ROM/ strength as compared to R shoulder to improve pain-free mobility.    OBJECTIVE IMPAIRMENTS: decreased activity tolerance, decreased endurance, decreased mobility, decreased ROM, decreased strength,  hypomobility, increased edema, impaired flexibility, impaired UE functional use, improper body mechanics, postural dysfunction, obesity, and pain.   ACTIVITY LIMITATIONS: carrying, lifting, sleeping, bathing, toileting, dressing, self feeding, reach over head, hygiene/grooming, and caring for others  PARTICIPATION LIMITATIONS: meal prep, cleaning, laundry, driving, shopping, and community activity  PERSONAL FACTORS: Fitness and Past/current experiences are also affecting patient's functional outcome.   REHAB POTENTIAL: Good  CLINICAL DECISION MAKING: Evolving/moderate complexity  EVALUATION COMPLEXITY: Moderate   GOALS: Goals reviewed with patient? Yes  SHORT TERM GOALS: Target date: 06/02/22  Pt. Independent with HEP to increase L shoulder AROM to [facility] as compared to R shoulder to improve overhead reaching/ ADL.   Baseline:  See above Goal status: INITIAL   LONG TERM GOALS: Target date: 06/30/22  Pt. Will increase FOTO to 64 to improve pain-free UE mobility.   Baseline: initial 42 Goal status: INITIAL  2.  Pt. Able to sleep in bed with proper positioning and no increase c/o L shoulder pain.   Baseline: sleeping in recliner Goal status: INITIAL  3.  Pt. Will increase L shoulder strength to grossly 4/5 MMT to improve functional tasks/ carrying.   Baseline: TBD Goal status: INITIAL  4.  Pt. Able to return to household tasks with no L shoulder pain or limitations.   Baseline: see restrictions/ MD protocol.  Goal status: INITIAL   PLAN:  PT FREQUENCY: 2x/week  PT DURATION: 8 weeks  PLANNED INTERVENTIONS: Therapeutic exercises, Therapeutic activity, Neuromuscular re-education, Balance training, Gait training, Patient/Family education, Self Care, Joint mobilization, Electrical stimulation, Cryotherapy, Moist heat, scar mobilization, Manual therapy, and Re-evaluation  PLAN FOR NEXT SESSION: Issue HEP  Cammie Mcgee, PT, DPT # [id number] 05/05/2022, 8:23 PM

## 2022-05-07 ENCOUNTER — Ambulatory Visit: Payer: Medicare HMO | Attending: Orthopedic Surgery | Admitting: Physical Therapy

## 2022-05-07 DIAGNOSIS — M25512 Pain in left shoulder: Secondary | ICD-10-CM | POA: Insufficient documentation

## 2022-05-07 DIAGNOSIS — Z96612 Presence of left artificial shoulder joint: Secondary | ICD-10-CM | POA: Diagnosis present

## 2022-05-07 DIAGNOSIS — M25612 Stiffness of left shoulder, not elsewhere classified: Secondary | ICD-10-CM | POA: Diagnosis present

## 2022-05-07 DIAGNOSIS — G8929 Other chronic pain: Secondary | ICD-10-CM | POA: Diagnosis present

## 2022-05-07 DIAGNOSIS — M6281 Muscle weakness (generalized): Secondary | ICD-10-CM | POA: Insufficient documentation

## 2022-05-09 NOTE — Therapy (Signed)
OUTPATIENT PHYSICAL THERAPY SHOULDER TREATMENT   Patient Name: Darlene Chen MRN: [id number] DOB:1950/04/25, 72 y.o., female Today's Date: 05/07/2022  END OF SESSION:  PT End of Session - 05/09/22 2104     Visit Number 2    Number of Visits 16    Date for PT Re-Evaluation 06/30/22    PT Start Time 1343    PT Stop Time 1434    PT Time Calculation (min) 51 min             Past Medical History:  Diagnosis Date   Diabetes mellitus without complication (HCC)    High cholesterol    Hypertension    Neutropenia (HCC)    Neutropenia (HCC)    Past Surgical History:  Procedure Laterality Date   arthroscopic knee surgery  on left 2000     BREAST BIOPSY Right 2019   bx/clip-neg   LEFT HEART CATH AND CORONARY ANGIOGRAPHY N/A 02/09/2017   Procedure: LEFT HEART CATH AND CORONARY ANGIOGRAPHY;  Surgeon: Alwyn Pea, MD;  Location: [facility];  Service: Cardiovascular;  Laterality: N/A;   REDUCTION MAMMAPLASTY Bilateral    1990   SHOULDER SURGERY Right    TUBAL LIGATION  1985   UMBILICAL HERNIA REPAIR  2007   Patient Active Problem List   Diagnosis Date Noted   Pancreatic cyst 11/21/2018   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HCC) 09/21/2018   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   Leucopenia 07/06/2018   B12 deficiency 07/06/2018   Fatty liver disease, nonalcoholic 12/12/2017   Onychomycosis 07/20/2017   Hypertensive disorder 12/03/2016   Intracranial aneurysm 12/03/2016   Family history of brain aneurysm 08/07/2016   Obstructive sleep apnea 06/17/2016   Hyperglycemia, unspecified 01/29/2016   Morbid obesity with BMI of 45.0-49.9, adult (HCC) 04/08/2015   Myalgia 11/15/2014   Costochondritis 04/11/2014   Osteopenia 04/11/2014   Prediabetes 02/15/2014   Hypercholesterolemia 09/26/2013   Degenerative arthritis of left knee 06/02/2013    PCP: Rayetta Humphrey, MD  REFERRING PROVIDER: Holland Commons, MD  REFERRING DIAG: s/p L reverse total  shoulder replacement  THERAPY DIAG:  Status post reverse total shoulder replacement, left  Chronic left shoulder pain  Shoulder joint stiffness, left  Muscle weakness (generalized)  Rationale for Evaluation and Treatment: Rehabilitation  ONSET DATE: 04/14/22  SUBJECTIVE:                                                                                                                                                                                      SUBJECTIVE STATEMENT:  EVALUATION Pt. S/p L reverse total shoulder replacement on 04/14/22.  Pt. Had previous R total shoulder.  Pt. Reports no L  shoulder pain at rest and 7-8/10 pain with movement.  Pt. Currently wearing a sling and slowly weaning out of sling with home activities.  Pt. Is sleeping in recliner due to difficulty getting comfortable at night.   Hand dominance: Right  PERTINENT HISTORY: Pt. Well known to PT clinic.  See previous PT/MD notes.   PAIN:  Are you having pain? Yes: NPRS scale: 7/10 Pain location: L shoulder Pain description: aching/ sharp Aggravating factors: PROM Relieving factors: rest/ ice  PRECAUTIONS: Shoulder  WEIGHT BEARING RESTRICTIONS: No  FALLS:  Has patient fallen in last 6 months? No  LIVING ENVIRONMENT: Lives with: lives with their spouse Lives in: House/apartment Has following equipment at home: Single point cane  OCCUPATION: Retired  PLOF: Independent  PATIENT GOALS:  Increase L shoulder ROM/ strength/ pain-free mobility  NEXT MD VISIT: approx. 6 weeks.  OBJECTIVE:   DIAGNOSTIC FINDINGS:  See imaging  PATIENT SURVEYS:  FOTO initial 42/ goal 52  COGNITION: Overall cognitive status: Within functional limits for tasks assessed     SENSATION: WFL  POSTURE: Rounded shoulders.    UPPER EXTREMITY ROM:   Passive ROM Right eval Left eval  Shoulder flexion 146 deg. 95 deg.  Shoulder extension  NT  Shoulder abduction 125 deg. 80 deg.  Shoulder adduction    Shoulder  internal rotation 75 deg. 30 deg.  Shoulder external rotation 75 deg. 0 deg.  Elbow flexion [facility] WFL  Elbow extension [facility] WFL  Wrist flexion    Wrist extension    Wrist ulnar deviation    Wrist radial deviation    Wrist pronation    Wrist supination    (Blank rows = not tested)  UPPER EXTREMITY MMT:   TBD when appropriate.  See MD protocol.  Grip strength: R 52#/ L 33#   SHOULDER SPECIAL TESTS: N/A  JOINT MOBILITY TESTING:  N/A  PALPATION:  Minimal tenderness around incision.  Incision is scabby/ dry and has 3 area of drainage (clear)- see picture.  Pt. Instructed to contact MD if incision worsens.    TODAY'S TREATMENT:                                                                                                                                         DATE: 05/07/22  Subjective:  Pt. Arrived to PT with use of sling and states she is not wearing sling at home.  Pt. Reports no pain at rest but increase symptoms with PROM.    There.ex.:  See HEP  (pulley ex/ scapular retraction (neutral)/ table top shoulder flexion/ abduction).    Manual tx.:   Supine L shoulder AA/PROM 10x (per MD protocol)  Reassessment of L shoulder incision (discussed with pt.)  Gentle STM to L UT/posterior to anterior deltoid/ proximal biceps)- 8 min.      PATIENT EDUCATION: Education details: HEP/ incision healing/care Person educated: Patient Education method: Medical illustrator  Education comprehension: verbalized understanding and returned demonstration  HOME EXERCISE PROGRAM: Will issue handouts next tx. session  ASSESSMENT:  CLINICAL IMPRESSION: Pt. Understands MD protocol and able to tolerate gentle L shoulder AA/PROM (flexion/ abduction).  Pt. Able to complete updated HEP with no issues.  Pt. Presents with slow healing incision with drainage noted at 3 spots/ steristrips in place.  Pt. Will benefit from skilled PT services to increase L shoulder ROM/ strength as compared to R  shoulder to improve pain-free mobility.    OBJECTIVE IMPAIRMENTS: decreased activity tolerance, decreased endurance, decreased mobility, decreased ROM, decreased strength, hypomobility, increased edema, impaired flexibility, impaired UE functional use, improper body mechanics, postural dysfunction, obesity, and pain.   ACTIVITY LIMITATIONS: carrying, lifting, sleeping, bathing, toileting, dressing, self feeding, reach over head, hygiene/grooming, and caring for others  PARTICIPATION LIMITATIONS: meal prep, cleaning, laundry, driving, shopping, and community activity  PERSONAL FACTORS: Fitness and Past/current experiences are also affecting patient's functional outcome.   REHAB POTENTIAL: Good  CLINICAL DECISION MAKING: Evolving/moderate complexity  EVALUATION COMPLEXITY: Moderate   GOALS: Goals reviewed with patient? Yes  SHORT TERM GOALS: Target date: 06/02/22  Pt. Independent with HEP to increase L shoulder AROM to [facility] as compared to R shoulder to improve overhead reaching/ ADL.   Baseline:  See above Goal status: INITIAL   LONG TERM GOALS: Target date: 06/30/22  Pt. Will increase FOTO to 64 to improve pain-free UE mobility.   Baseline: initial 42 Goal status: INITIAL  2.  Pt. Able to sleep in bed with proper positioning and no increase c/o L shoulder pain.   Baseline: sleeping in recliner Goal status: INITIAL  3.  Pt. Will increase L shoulder strength to grossly 4/5 MMT to improve functional tasks/ carrying.   Baseline: TBD Goal status: INITIAL  4.  Pt. Able to return to household tasks with no L shoulder pain or limitations.   Baseline: see restrictions/ MD protocol.  Goal status: INITIAL   PLAN:  PT FREQUENCY: 2x/week  PT DURATION: 8 weeks  PLANNED INTERVENTIONS: Therapeutic exercises, Therapeutic activity, Neuromuscular re-education, Balance training, Gait training, Patient/Family education, Self Care, Joint mobilization, Electrical stimulation, Cryotherapy,  Moist heat, scar mobilization, Manual therapy, and Re-evaluation  PLAN FOR NEXT SESSION: Discuss HEP/ sleeping position/ follow MD protocol.   Cammie Mcgee, PT, DPT # [phone] 05/09/2022, 9:06 PM

## 2022-05-10 NOTE — Addendum Note (Signed)
Addended by: Cammie Mcgee on: 05/10/2022 08:47 AM   Modules accepted: Orders

## 2022-05-12 ENCOUNTER — Encounter: Payer: Self-pay | Admitting: Physical Therapy

## 2022-05-12 ENCOUNTER — Ambulatory Visit: Payer: Medicare HMO | Admitting: Physical Therapy

## 2022-05-12 DIAGNOSIS — G8929 Other chronic pain: Secondary | ICD-10-CM

## 2022-05-12 DIAGNOSIS — Z96612 Presence of left artificial shoulder joint: Secondary | ICD-10-CM | POA: Diagnosis not present

## 2022-05-12 DIAGNOSIS — M6281 Muscle weakness (generalized): Secondary | ICD-10-CM

## 2022-05-12 DIAGNOSIS — M25612 Stiffness of left shoulder, not elsewhere classified: Secondary | ICD-10-CM

## 2022-05-12 NOTE — Therapy (Signed)
OUTPATIENT PHYSICAL THERAPY SHOULDER TREATMENT   Patient Name: Darlene Chen MRN: [id number] DOB:1950/04/02, 72 y.o., female Today's Date: 05/12/2022  END OF SESSION:  PT End of Session - 05/12/22 1304     Visit Number 3    Number of Visits 16    Date for PT Re-Evaluation 06/30/22    PT Start Time 1300    PT Stop Time 1347    PT Time Calculation (min) 47 min             Past Medical History:  Diagnosis Date   Diabetes mellitus without complication (HCC)    High cholesterol    Hypertension    Neutropenia (HCC)    Neutropenia (HCC)    Past Surgical History:  Procedure Laterality Date   arthroscopic knee surgery  on left 2000     BREAST BIOPSY Right 2019   bx/clip-neg   LEFT HEART CATH AND CORONARY ANGIOGRAPHY N/A 02/09/2017   Procedure: LEFT HEART CATH AND CORONARY ANGIOGRAPHY;  Surgeon: Alwyn Pea, MD;  Location: [facility];  Service: Cardiovascular;  Laterality: N/A;   REDUCTION MAMMAPLASTY Bilateral    1990   SHOULDER SURGERY Right    TUBAL LIGATION  1985   UMBILICAL HERNIA REPAIR  2007   Patient Active Problem List   Diagnosis Date Noted   Pancreatic cyst 11/21/2018   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HCC) 09/21/2018   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   Leucopenia 07/06/2018   B12 deficiency 07/06/2018   Fatty liver disease, nonalcoholic 12/12/2017   Onychomycosis 07/20/2017   Hypertensive disorder 12/03/2016   Intracranial aneurysm 12/03/2016   Family history of brain aneurysm 08/07/2016   Obstructive sleep apnea 06/17/2016   Hyperglycemia, unspecified 01/29/2016   Morbid obesity with BMI of 45.0-49.9, adult (HCC) 04/08/2015   Myalgia 11/15/2014   Costochondritis 04/11/2014   Osteopenia 04/11/2014   Prediabetes 02/15/2014   Hypercholesterolemia 09/26/2013   Degenerative arthritis of left knee 06/02/2013    PCP: Rayetta Humphrey, MD  REFERRING PROVIDER: Holland Commons, MD  REFERRING DIAG: s/p L reverse total  shoulder replacement  THERAPY DIAG:  Status post reverse total shoulder replacement, left  Chronic left shoulder pain  Shoulder joint stiffness, left  Muscle weakness (generalized)  Rationale for Evaluation and Treatment: Rehabilitation  ONSET DATE: 04/14/22  SUBJECTIVE:                                                                                                                                                                                      SUBJECTIVE STATEMENT:  EVALUATION Pt. S/p L reverse total shoulder replacement on 04/14/22.  Pt. Had previous R total shoulder.  Pt. Reports no L  shoulder pain at rest and 7-8/10 pain with movement.  Pt. Currently wearing a sling and slowly weaning out of sling with home activities.  Pt. Is sleeping in recliner due to difficulty getting comfortable at night.   Hand dominance: Right  PERTINENT HISTORY: Pt. Well known to PT clinic.  See previous PT/MD notes.   PAIN:  Are you having pain? Yes: NPRS scale: 7/10 Pain location: L shoulder Pain description: aching/ sharp Aggravating factors: PROM Relieving factors: rest/ ice  PRECAUTIONS: Shoulder  WEIGHT BEARING RESTRICTIONS: No  FALLS:  Has patient fallen in last 6 months? No  LIVING ENVIRONMENT: Lives with: lives with their spouse Lives in: House/apartment Has following equipment at home: Single point cane  OCCUPATION: Retired  PLOF: Independent  PATIENT GOALS:  Increase L shoulder ROM/ strength/ pain-free mobility  NEXT MD VISIT: approx. 6 weeks.  OBJECTIVE:   DIAGNOSTIC FINDINGS:  See imaging  PATIENT SURVEYS:  FOTO initial 42/ goal 19  COGNITION: Overall cognitive status: Within functional limits for tasks assessed     SENSATION: WFL  POSTURE: Rounded shoulders.    UPPER EXTREMITY ROM:   Passive ROM Right eval Left eval  Shoulder flexion 146 deg. 95 deg.  Shoulder extension  NT  Shoulder abduction 125 deg. 80 deg.  Shoulder adduction    Shoulder  internal rotation 75 deg. 30 deg.  Shoulder external rotation 75 deg. 0 deg.  Elbow flexion [facility] WFL  Elbow extension [facility] WFL  Wrist flexion    Wrist extension    Wrist ulnar deviation    Wrist radial deviation    Wrist pronation    Wrist supination    (Blank rows = not tested)  UPPER EXTREMITY MMT:   TBD when appropriate.  See MD protocol.  Grip strength: R 52#/ L 33#   SHOULDER SPECIAL TESTS: N/A  JOINT MOBILITY TESTING:  N/A  PALPATION:  Minimal tenderness around incision.  Incision is scabby/ dry and has 3 area of drainage (clear)- see picture.  Pt. Instructed to contact MD if incision worsens.    TODAY'S TREATMENT:                                                                                                                                         DATE: 05/12/2022  Subjective:  Pt. Arrived to PT with use of sling but states she is not wearing sling at home.  Pt. Reports no pain at rest but increase symptoms with PROM.  Pt. Reapplied 4 steristrips to prevent gapping of incision.    There.ex.:  Standing wall ladder with L 3x (marked with sticker).    Supine L serratus punches 10x (light PT assist)/ wand chest press/ bicep curls with PT assist for control 10x.  Walking in clinic without sling with focus on consistent arm swing.   Reviewed HEP.  Manual tx.:   Supine L shoulder AA/PROM 10x (per MD protocol)  Reassessment of L shoulder incision.  Pt. Has 3 areas of drainage noted at proximal/mid./distal aspects of incision.  Drainage slight yellow and bloody.  PT cleaned area and applied 4 bandaids.  Pt. Instructed to contact MD office to discuss and notify signs of infection.  Warmth noted over incision.    Gentle STM to L UT/posterior to anterior deltoid/ proximal biceps)- 8 min.     Supine L shoulder PROM: flexion 124 deg./ abduction 90 deg.    Ice to L shoulder after tx. Session in sitting.     PATIENT EDUCATION: Education details: HEP/ incision  healing/care Person educated: Patient Education method: Medical illustrator Education comprehension: verbalized understanding and returned demonstration  HOME EXERCISE PROGRAM: Will issue handouts next tx. session  ASSESSMENT:  CLINICAL IMPRESSION: Pt. Understands MD protocol and able to tolerate gentle L shoulder AA/PROM (flexion/ abduction).  Pt. Presents with slow healing incision with drainage noted at 3 spots/ bandaids in place.  Pt. Notified to contact MD office to determine POC for incision.  Pt. Will benefit from skilled PT services to increase L shoulder ROM/ strength as compared to R shoulder to improve pain-free mobility.    OBJECTIVE IMPAIRMENTS: decreased activity tolerance, decreased endurance, decreased mobility, decreased ROM, decreased strength, hypomobility, increased edema, impaired flexibility, impaired UE functional use, improper body mechanics, postural dysfunction, obesity, and pain.   ACTIVITY LIMITATIONS: carrying, lifting, sleeping, bathing, toileting, dressing, self feeding, reach over head, hygiene/grooming, and caring for others  PARTICIPATION LIMITATIONS: meal prep, cleaning, laundry, driving, shopping, and community activity  PERSONAL FACTORS: Fitness and Past/current experiences are also affecting patient's functional outcome.   REHAB POTENTIAL: Good  CLINICAL DECISION MAKING: Evolving/moderate complexity  EVALUATION COMPLEXITY: Moderate   GOALS: Goals reviewed with patient? Yes  SHORT TERM GOALS: Target date: 06/02/22  Pt. Independent with HEP to increase L shoulder AROM to [facility] as compared to R shoulder to improve overhead reaching/ ADL.   Baseline:  See above Goal status: INITIAL   LONG TERM GOALS: Target date: 06/30/22  Pt. Will increase FOTO to 64 to improve pain-free UE mobility.   Baseline: initial 42 Goal status: INITIAL  2.  Pt. Able to sleep in bed with proper positioning and no increase c/o L shoulder pain.   Baseline:  sleeping in recliner Goal status: INITIAL  3.  Pt. Will increase L shoulder strength to grossly 4/5 MMT to improve functional tasks/ carrying.   Baseline: TBD Goal status: INITIAL  4.  Pt. Able to return to household tasks with no L shoulder pain or limitations.   Baseline: see restrictions/ MD protocol.  Goal status: INITIAL   PLAN:  PT FREQUENCY: 2x/week  PT DURATION: 8 weeks  PLANNED INTERVENTIONS: Therapeutic exercises, Therapeutic activity, Neuromuscular re-education, Balance training, Gait training, Patient/Family education, Self Care, Joint mobilization, Electrical stimulation, Cryotherapy, Moist heat, scar mobilization, Manual therapy, and Re-evaluation  PLAN FOR NEXT SESSION: Progress HEP/ reassess incision healing.   Cammie Mcgee, PT, DPT # [phone] 05/12/2022, 6:18 PM

## 2022-05-14 ENCOUNTER — Ambulatory Visit: Payer: Medicare HMO | Admitting: Physical Therapy

## 2022-05-14 ENCOUNTER — Encounter: Payer: Self-pay | Admitting: Physical Therapy

## 2022-05-14 DIAGNOSIS — G8929 Other chronic pain: Secondary | ICD-10-CM

## 2022-05-14 DIAGNOSIS — M25612 Stiffness of left shoulder, not elsewhere classified: Secondary | ICD-10-CM

## 2022-05-14 DIAGNOSIS — M6281 Muscle weakness (generalized): Secondary | ICD-10-CM

## 2022-05-14 DIAGNOSIS — Z96612 Presence of left artificial shoulder joint: Secondary | ICD-10-CM | POA: Diagnosis not present

## 2022-05-14 NOTE — Therapy (Signed)
OUTPATIENT PHYSICAL THERAPY SHOULDER TREATMENT   Patient Name: Darlene Chen MRN: [id number] DOB:04-20-1950, 71 y.o., female Today's Date: 05/14/2022  END OF SESSION:  PT End of Session - 05/14/22 1246     Visit Number 4    Number of Visits 16    Date for PT Re-Evaluation 06/30/22    PT Start Time 1256            1256 to 1345  (49 minutes)   Past Medical History:  Diagnosis Date   Diabetes mellitus without complication (HCC)    High cholesterol    Hypertension    Neutropenia (HCC)    Neutropenia (HCC)    Past Surgical History:  Procedure Laterality Date   arthroscopic knee surgery  on left 2000     BREAST BIOPSY Right 2019   bx/clip-neg   LEFT HEART CATH AND CORONARY ANGIOGRAPHY N/A 02/09/2017   Procedure: LEFT HEART CATH AND CORONARY ANGIOGRAPHY;  Surgeon: Alwyn Pea, MD;  Location: [facility];  Service: Cardiovascular;  Laterality: N/A;   REDUCTION MAMMAPLASTY Bilateral    1990   SHOULDER SURGERY Right    TUBAL LIGATION  1985   UMBILICAL HERNIA REPAIR  2007   Patient Active Problem List   Diagnosis Date Noted   Pancreatic cyst 11/21/2018   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HCC) 09/21/2018   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   Leucopenia 07/06/2018   B12 deficiency 07/06/2018   Fatty liver disease, nonalcoholic 12/12/2017   Onychomycosis 07/20/2017   Hypertensive disorder 12/03/2016   Intracranial aneurysm 12/03/2016   Family history of brain aneurysm 08/07/2016   Obstructive sleep apnea 06/17/2016   Hyperglycemia, unspecified 01/29/2016   Morbid obesity with BMI of 45.0-49.9, adult (HCC) 04/08/2015   Myalgia 11/15/2014   Costochondritis 04/11/2014   Osteopenia 04/11/2014   Prediabetes 02/15/2014   Hypercholesterolemia 09/26/2013   Degenerative arthritis of left knee 06/02/2013    PCP: Rayetta Humphrey, MD  REFERRING PROVIDER: Holland Commons, MD  REFERRING DIAG: s/p L reverse total shoulder replacement  THERAPY  DIAG:  Status post reverse total shoulder replacement, left  Chronic left shoulder pain  Shoulder joint stiffness, left  Muscle weakness (generalized)  Rationale for Evaluation and Treatment: Rehabilitation  ONSET DATE: 04/14/22  SUBJECTIVE:                                                                                                                                                                                      SUBJECTIVE STATEMENT:  EVALUATION Pt. S/p L reverse total shoulder replacement on 04/14/22.  Pt. Had previous R total shoulder.  Pt. Reports no L shoulder pain at rest and 7-8/10 pain with movement.  Pt. Currently wearing a sling and slowly weaning out of sling with home activities.  Pt. Is sleeping in recliner due to difficulty getting comfortable at night.   Hand dominance: Right  PERTINENT HISTORY: Pt. Well known to PT clinic.  See previous PT/MD notes.   PAIN:  Are you having pain? Yes: NPRS scale: 7/10 Pain location: L shoulder Pain description: aching/ sharp Aggravating factors: PROM Relieving factors: rest/ ice  PRECAUTIONS: Shoulder  WEIGHT BEARING RESTRICTIONS: No  FALLS:  Has patient fallen in last 6 months? No  LIVING ENVIRONMENT: Lives with: lives with their spouse Lives in: House/apartment Has following equipment at home: Single point cane  OCCUPATION: Retired  PLOF: Independent  PATIENT GOALS:  Increase L shoulder ROM/ strength/ pain-free mobility  NEXT MD VISIT: approx. 6 weeks.  OBJECTIVE:   DIAGNOSTIC FINDINGS:  See imaging  PATIENT SURVEYS:  FOTO initial 42/ goal 64  COGNITION: Overall cognitive status: Within functional limits for tasks assessed     SENSATION: WFL  POSTURE: Rounded shoulders.    UPPER EXTREMITY ROM:   Passive ROM Right eval Left eval  Shoulder flexion 146 deg. 95 deg.  Shoulder extension  NT  Shoulder abduction 125 deg. 80 deg.  Shoulder adduction    Shoulder internal rotation 75 deg. 30 deg.   Shoulder external rotation 75 deg. 0 deg.  Elbow flexion [facility] WFL  Elbow extension [facility] WFL  Wrist flexion    Wrist extension    Wrist ulnar deviation    Wrist radial deviation    Wrist pronation    Wrist supination    (Blank rows = not tested)  UPPER EXTREMITY MMT:   TBD when appropriate.  See MD protocol.  Grip strength: R 52#/ L 33#   SHOULDER SPECIAL TESTS: N/A  JOINT MOBILITY TESTING:  N/A  PALPATION:  Minimal tenderness around incision.  Incision is scabby/ dry and has 3 area of drainage (clear)- see picture.  Pt. Instructed to contact MD if incision worsens.   5/7: Supine L shoulder PROM: flexion 124 deg./ abduction 90 deg.    TODAY'S TREATMENT:                                                                                                                                         DATE: 05/14/2022  Subjective:  Pt. Has started taking antibiotics for L shoulder drainage (10 day supply).  Pt. Reports no L shoulder pain prior to PT tx.  Pt. Slept in bed for a couple hours before increase discomfort in L shoulder.     There.ex.:  Seated shoulder pulley (flexion/ scaption/ abduction)- 10x2 each.  Pain tolerable range limits abduction.  Cuing to keep L elbow extended during pulley.    Standing wall ladder with L 3x (marked with sticker)- 4 rung improvement.    Supine L serratus punches 10x (light PT assist)/ wand chest press/ sh. Flexion/ bicep curls with  PT assist for control 10x.  Walking in clinic without sling with focus on consistent arm swing.   Reviewed HEP.  Manual tx.:   Supine L shoulder AA/PROM 10x (per MD protocol)  Reassessment of L shoulder incision.  Pt. Has no drainage noted at proximal/mid./distal aspects of incision but gapping at proximal/ distal aspect of incision (1 steristrip in place).  PT cleaned area   Gentle STM to L UT/posterior to anterior deltoid/ proximal biceps)- 6 min.       Ice to L shoulder after tx. Session in sitting.      PATIENT EDUCATION: Education details: HEP/ incision healing/care Person educated: Patient Education method: Medical illustrator Education comprehension: verbalized understanding and returned demonstration  HOME EXERCISE PROGRAM: Will issue handouts next tx. session  ASSESSMENT:  CLINICAL IMPRESSION: Pt. Understands MD protocol and able to tolerate gentle L shoulder AA/PROM (flexion/ abduction).  Pt. Presents with slow healing incision with drainage noted at 3 spots/ bandaids in place.  Pt. Notified to contact MD office to determine POC for incision.  Pt. Will benefit from skilled PT services to increase L shoulder ROM/ strength as compared to R shoulder to improve pain-free mobility.    OBJECTIVE IMPAIRMENTS: decreased activity tolerance, decreased endurance, decreased mobility, decreased ROM, decreased strength, hypomobility, increased edema, impaired flexibility, impaired UE functional use, improper body mechanics, postural dysfunction, obesity, and pain.   ACTIVITY LIMITATIONS: carrying, lifting, sleeping, bathing, toileting, dressing, self feeding, reach over head, hygiene/grooming, and caring for others  PARTICIPATION LIMITATIONS: meal prep, cleaning, laundry, driving, shopping, and community activity  PERSONAL FACTORS: Fitness and Past/current experiences are also affecting patient's functional outcome.   REHAB POTENTIAL: Good  CLINICAL DECISION MAKING: Evolving/moderate complexity  EVALUATION COMPLEXITY: Moderate   GOALS: Goals reviewed with patient? Yes  SHORT TERM GOALS: Target date: 06/02/22  Pt. Independent with HEP to increase L shoulder AROM to [facility] as compared to R shoulder to improve overhead reaching/ ADL.   Baseline:  See above Goal status: INITIAL   LONG TERM GOALS: Target date: 06/30/22  Pt. Will increase FOTO to 64 to improve pain-free UE mobility.   Baseline: initial 42 Goal status: INITIAL  2.  Pt. Able to sleep in bed with proper  positioning and no increase c/o L shoulder pain.   Baseline: sleeping in recliner Goal status: INITIAL  3.  Pt. Will increase L shoulder strength to grossly 4/5 MMT to improve functional tasks/ carrying.   Baseline: TBD Goal status: INITIAL  4.  Pt. Able to return to household tasks with no L shoulder pain or limitations.   Baseline: see restrictions/ MD protocol.  Goal status: INITIAL   PLAN:  PT FREQUENCY: 2x/week  PT DURATION: 8 weeks  PLANNED INTERVENTIONS: Therapeutic exercises, Therapeutic activity, Neuromuscular re-education, Balance training, Gait training, Patient/Family education, Self Care, Joint mobilization, Electrical stimulation, Cryotherapy, Moist heat, scar mobilization, Manual therapy, and Re-evaluation  PLAN FOR NEXT SESSION: Progress HEP/ reassess incision healing.   Cammie Mcgee, PT, DPT # [phone] 05/14/2022, 12:57 PM

## 2022-05-19 ENCOUNTER — Encounter: Payer: Self-pay | Admitting: Physical Therapy

## 2022-05-19 ENCOUNTER — Ambulatory Visit: Payer: Medicare HMO | Admitting: Physical Therapy

## 2022-05-19 DIAGNOSIS — M25612 Stiffness of left shoulder, not elsewhere classified: Secondary | ICD-10-CM

## 2022-05-19 DIAGNOSIS — Z96612 Presence of left artificial shoulder joint: Secondary | ICD-10-CM | POA: Diagnosis not present

## 2022-05-19 DIAGNOSIS — G8929 Other chronic pain: Secondary | ICD-10-CM

## 2022-05-19 DIAGNOSIS — M6281 Muscle weakness (generalized): Secondary | ICD-10-CM

## 2022-05-19 NOTE — Therapy (Signed)
OUTPATIENT PHYSICAL THERAPY SHOULDER TREATMENT  Patient Name: Darlene Chen MRN: [id number] DOB:02-24-1950, 72 y.o., female Today's Date: 05/19/2022  END OF SESSION:  PT End of Session - 05/19/22 1248     Visit Number 5    Number of Visits 16    Date for PT Re-Evaluation 06/30/22    PT Start Time 1248    PT Stop Time 1342    PT Time Calculation (min) 54 min            Past Medical History:  Diagnosis Date   Diabetes mellitus without complication (HCC)    High cholesterol    Hypertension    Neutropenia (HCC)    Neutropenia (HCC)    Past Surgical History:  Procedure Laterality Date   arthroscopic knee surgery  on left 2000     BREAST BIOPSY Right 2019   bx/clip-neg   LEFT HEART CATH AND CORONARY ANGIOGRAPHY N/A 02/09/2017   Procedure: LEFT HEART CATH AND CORONARY ANGIOGRAPHY;  Surgeon: Alwyn Pea, MD;  Location: [facility];  Service: Cardiovascular;  Laterality: N/A;   REDUCTION MAMMAPLASTY Bilateral    1990   SHOULDER SURGERY Right    TUBAL LIGATION  1985   UMBILICAL HERNIA REPAIR  2007   Patient Active Problem List   Diagnosis Date Noted   Pancreatic cyst 11/21/2018   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HCC) 09/21/2018   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   Leucopenia 07/06/2018   B12 deficiency 07/06/2018   Fatty liver disease, nonalcoholic 12/12/2017   Onychomycosis 07/20/2017   Hypertensive disorder 12/03/2016   Intracranial aneurysm 12/03/2016   Family history of brain aneurysm 08/07/2016   Obstructive sleep apnea 06/17/2016   Hyperglycemia, unspecified 01/29/2016   Morbid obesity with BMI of 45.0-49.9, adult (HCC) 04/08/2015   Myalgia 11/15/2014   Costochondritis 04/11/2014   Osteopenia 04/11/2014   Prediabetes 02/15/2014   Hypercholesterolemia 09/26/2013   Degenerative arthritis of left knee 06/02/2013    PCP: Rayetta Humphrey, MD  REFERRING PROVIDER: Holland Commons, MD  REFERRING DIAG: s/p L reverse total shoulder  replacement  THERAPY DIAG:  Status post reverse total shoulder replacement, left  Chronic left shoulder pain  Shoulder joint stiffness, left  Muscle weakness (generalized)  Rationale for Evaluation and Treatment: Rehabilitation  ONSET DATE: 04/14/22  SUBJECTIVE:                                                                                                                                                                                      SUBJECTIVE STATEMENT:  EVALUATION Pt. S/p L reverse total shoulder replacement on 04/14/22.  Pt. Had previous R total shoulder.  Pt. Reports no L shoulder pain  at rest and 7-8/10 pain with movement.  Pt. Currently wearing a sling and slowly weaning out of sling with home activities.  Pt. Is sleeping in recliner due to difficulty getting comfortable at night.   Hand dominance: Right  PERTINENT HISTORY: Pt. Well known to PT clinic.  See previous PT/MD notes.   PAIN:  Are you having pain? Yes: NPRS scale: 7/10 Pain location: L shoulder Pain description: aching/ sharp Aggravating factors: PROM Relieving factors: rest/ ice  PRECAUTIONS: Shoulder  WEIGHT BEARING RESTRICTIONS: No  FALLS:  Has patient fallen in last 6 months? No  LIVING ENVIRONMENT: Lives with: lives with their spouse Lives in: House/apartment Has following equipment at home: Single point cane  OCCUPATION: Retired  PLOF: Independent  PATIENT GOALS:  Increase L shoulder ROM/ strength/ pain-free mobility  NEXT MD VISIT: approx. 6 weeks.  OBJECTIVE:   DIAGNOSTIC FINDINGS:  See imaging  PATIENT SURVEYS:  FOTO initial 42/ goal 17  COGNITION: Overall cognitive status: Within functional limits for tasks assessed     SENSATION: WFL  POSTURE: Rounded shoulders.    UPPER EXTREMITY ROM:   Passive ROM Right eval Left eval  Shoulder flexion 146 deg. 95 deg.  Shoulder extension  NT  Shoulder abduction 125 deg. 80 deg.  Shoulder adduction    Shoulder internal  rotation 75 deg. 30 deg.  Shoulder external rotation 75 deg. 0 deg.  Elbow flexion [facility] WFL  Elbow extension [facility] WFL  Wrist flexion    Wrist extension    Wrist ulnar deviation    Wrist radial deviation    Wrist pronation    Wrist supination    (Blank rows = not tested)  UPPER EXTREMITY MMT:   TBD when appropriate.  See MD protocol.  Grip strength: R 52#/ L 33#   SHOULDER SPECIAL TESTS: N/A  JOINT MOBILITY TESTING:  N/A  PALPATION:  Minimal tenderness around incision.  Incision is scabby/ dry and has 3 area of drainage (clear)- see picture.  Pt. Instructed to contact MD if incision worsens.   5/7: Supine L shoulder PROM: flexion 124 deg./ abduction 90 deg.    TODAY'S TREATMENT:                                                                                                                                         DATE: 05/19/2022  Subjective:  Pt. Reports no L shoulder pain prior to PT tx.  No issues reported this weekend.  Pt. Compliant with HEP and pulley ex.   There.ex.:  B UBE 2 min. F/b.  L shoulder sensitive to touch.  Discussed incision/ still a few area that are not healed.  Pt. Has 4 more days of antibiotics.  No increase c/o L shoulder pain.    Standing wall ladder with L 5x (marked with sticker)- static holds.     Seated wand ex.: chest press/ sh. Flexion 10x2.  Focus  on L elbow extension.    Supine L shoulder AROM: flexion/ horizontal abduction 10x each.  Light PT assist for technique/ control.    Supine L shoulder serratus punches (20x).  Supine L elbow extension stretches.    Manual tx.:   Supine L shoulder AA/PROM 10x (per MD protocol)  Reassessment of L shoulder incision.  Pt. Has no drainage noted at proximal/mid./distal aspects of incision but gapping at distal aspect of incision (PT cleaned area and applied 2 steristrips).    Gentle STM to L UT/posterior to anterior deltoid/ proximal biceps)- 6 min.       Pt. Will ice L shoulder at home.       PATIENT EDUCATION: Education details: HEP/ incision healing/care Person educated: Patient Education method: Medical illustrator Education comprehension: verbalized understanding and returned demonstration  HOME EXERCISE PROGRAM: Will issue handouts next tx. session  ASSESSMENT:  CLINICAL IMPRESSION: Pt. Understands MD protocol and able to tolerate gentle L shoulder A/AROM (flexion/ abduction).  Pt. Presents with slow healing incision with 3 steristrips present due to gapping.  Slight increase in L shoulder pain during gentle L elbow extension/ sh. Abduction  Pt. Will benefit from skilled PT services to increase L shoulder ROM/ strength as compared to R shoulder to improve pain-free mobility.    OBJECTIVE IMPAIRMENTS: decreased activity tolerance, decreased endurance, decreased mobility, decreased ROM, decreased strength, hypomobility, increased edema, impaired flexibility, impaired UE functional use, improper body mechanics, postural dysfunction, obesity, and pain.   ACTIVITY LIMITATIONS: carrying, lifting, sleeping, bathing, toileting, dressing, self feeding, reach over head, hygiene/grooming, and caring for others  PARTICIPATION LIMITATIONS: meal prep, cleaning, laundry, driving, shopping, and community activity  PERSONAL FACTORS: Fitness and Past/current experiences are also affecting patient's functional outcome.   REHAB POTENTIAL: Good  CLINICAL DECISION MAKING: Evolving/moderate complexity  EVALUATION COMPLEXITY: Moderate   GOALS: Goals reviewed with patient? Yes  SHORT TERM GOALS: Target date: 06/02/22  Pt. Independent with HEP to increase L shoulder AROM to [facility] as compared to R shoulder to improve overhead reaching/ ADL.   Baseline:  See above Goal status: INITIAL   LONG TERM GOALS: Target date: 06/30/22  Pt. Will increase FOTO to 64 to improve pain-free UE mobility.   Baseline: initial 42 Goal status: INITIAL  2.  Pt. Able to sleep in bed with  proper positioning and no increase c/o L shoulder pain.   Baseline: sleeping in recliner Goal status: INITIAL  3.  Pt. Will increase L shoulder strength to grossly 4/5 MMT to improve functional tasks/ carrying.   Baseline: TBD Goal status: INITIAL  4.  Pt. Able to return to household tasks with no L shoulder pain or limitations.   Baseline: see restrictions/ MD protocol.  Goal status: INITIAL   PLAN:  PT FREQUENCY: 2x/week  PT DURATION: 8 weeks  PLANNED INTERVENTIONS: Therapeutic exercises, Therapeutic activity, Neuromuscular re-education, Balance training, Gait training, Patient/Family education, Self Care, Joint mobilization, Electrical stimulation, Cryotherapy, Moist heat, scar mobilization, Manual therapy, and Re-evaluation  PLAN FOR NEXT SESSION: Update pts. HEP/ check MD protocol.    Cammie Mcgee, PT, DPT # [phone] 05/19/2022, 1:45 PM

## 2022-05-21 ENCOUNTER — Ambulatory Visit: Payer: Medicare HMO | Admitting: Physical Therapy

## 2022-05-21 DIAGNOSIS — Z96612 Presence of left artificial shoulder joint: Secondary | ICD-10-CM | POA: Diagnosis not present

## 2022-05-21 DIAGNOSIS — M25612 Stiffness of left shoulder, not elsewhere classified: Secondary | ICD-10-CM

## 2022-05-21 DIAGNOSIS — M6281 Muscle weakness (generalized): Secondary | ICD-10-CM

## 2022-05-21 DIAGNOSIS — G8929 Other chronic pain: Secondary | ICD-10-CM

## 2022-05-21 NOTE — Therapy (Signed)
OUTPATIENT PHYSICAL THERAPY SHOULDER TREATMENT  Patient Name: Darlene Chen MRN: [id number] DOB:Mar 20, 1950, 72 y.o., female Today's Date: 05/21/2022  END OF SESSION:  PT End of Session - 05/21/22 1221     Visit Number 6    Number of Visits 16    Date for PT Re-Evaluation 06/30/22    PT Start Time 1255    PT Stop Time 1341    PT Time Calculation (min) 46 min            Past Medical History:  Diagnosis Date   Diabetes mellitus without complication (HCC)    High cholesterol    Hypertension    Neutropenia (HCC)    Neutropenia (HCC)    Past Surgical History:  Procedure Laterality Date   arthroscopic knee surgery  on left 2000     BREAST BIOPSY Right 2019   bx/clip-neg   LEFT HEART CATH AND CORONARY ANGIOGRAPHY N/A 02/09/2017   Procedure: LEFT HEART CATH AND CORONARY ANGIOGRAPHY;  Surgeon: Alwyn Pea, MD;  Location: [facility] IN[facility] CV [facility];  Service: Cardiovascular;  Laterality: N/A;   REDUCTION MAMMAPLASTY Bilateral    1990   SHOULDER SURGERY Right    TUBAL LIGATION  1985   UMBILICAL HERNIA REPAIR  2007   Patient Active Problem List   Diagnosis Date Noted   Pancreatic cyst 11/21/2018   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HCC) 09/21/2018   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   Leucopenia 07/06/2018   B12 deficiency 07/06/2018   Fatty liver disease, nonalcoholic 12/12/2017   Onychomycosis 07/20/2017   Hypertensive disorder 12/03/2016   Intracranial aneurysm 12/03/2016   Family history of brain aneurysm 08/07/2016   Obstructive sleep apnea 06/17/2016   Hyperglycemia, unspecified 01/29/2016   Morbid obesity with BMI of 45.0-49.9, adult (HCC) 04/08/2015   Myalgia 11/15/2014   Costochondritis 04/11/2014   Osteopenia 04/11/2014   Prediabetes 02/15/2014   Hypercholesterolemia 09/26/2013   Degenerative arthritis of left knee 06/02/2013    PCP: Rayetta Humphrey, MD  REFERRING PROVIDER: Holland Commons, MD  REFERRING DIAG: s/p L reverse total shoulder  replacement  THERAPY DIAG:  Status post reverse total shoulder replacement, left  Chronic left shoulder pain  Shoulder joint stiffness, left  Muscle weakness (generalized)  Rationale for Evaluation and Treatment: Rehabilitation  ONSET DATE: 04/14/22  SUBJECTIVE:                                                                                                                                                                                      SUBJECTIVE STATEMENT:  EVALUATION Pt. S/p L reverse total shoulder replacement on 04/14/22.  Pt. Had previous R total shoulder.  Pt. Reports no L shoulder pain  at rest and 7-8/10 pain with movement.  Pt. Currently wearing a sling and slowly weaning out of sling with home activities.  Pt. Is sleeping in recliner due to difficulty getting comfortable at night.   Hand dominance: Right  PERTINENT HISTORY: Pt. Well known to PT clinic.  See previous PT/MD notes.   PAIN:  Are you having pain? Yes: NPRS scale: 7/10 Pain location: L shoulder Pain description: aching/ sharp Aggravating factors: PROM Relieving factors: rest/ ice  PRECAUTIONS: Shoulder  WEIGHT BEARING RESTRICTIONS: No  FALLS:  Has patient fallen in last 6 months? No  LIVING ENVIRONMENT: Lives with: lives with their spouse Lives in: House/apartment Has following equipment at home: Single point cane  OCCUPATION: Retired  PLOF: Independent  PATIENT GOALS:  Increase L shoulder ROM/ strength/ pain-free mobility  NEXT MD VISIT: approx. 6 weeks.  OBJECTIVE:   DIAGNOSTIC FINDINGS:  See imaging  PATIENT SURVEYS:  FOTO initial 42/ goal 66  COGNITION: Overall cognitive status: Within functional limits for tasks assessed     SENSATION: WFL  POSTURE: Rounded shoulders.    UPPER EXTREMITY ROM:   Passive ROM Right eval Left eval  Shoulder flexion 146 deg. 95 deg.  Shoulder extension  NT  Shoulder abduction 125 deg. 80 deg.  Shoulder adduction    Shoulder internal  rotation 75 deg. 30 deg.  Shoulder external rotation 75 deg. 0 deg.  Elbow flexion [facility] WFL  Elbow extension [facility] WFL  Wrist flexion    Wrist extension    Wrist ulnar deviation    Wrist radial deviation    Wrist pronation    Wrist supination    (Blank rows = not tested)  UPPER EXTREMITY MMT:   TBD when appropriate.  See MD protocol.  Grip strength: R 52#/ L 33#   SHOULDER SPECIAL TESTS: N/A  JOINT MOBILITY TESTING:  N/A  PALPATION:  Minimal tenderness around incision.  Incision is scabby/ dry and has 3 area of drainage (clear)- see picture.  Pt. Instructed to contact MD if incision worsens.   5/7: Supine L shoulder PROM: flexion 124 deg./ abduction 90 deg.    TODAY'S TREATMENT:                                                                                                                                         DATE: 05/21/2022  Subjective:  Pt. Reports no L shoulder pain prior to PT tx.  No issues reported this weekend.  Pt. Is s/p 5 weeks from surgery (see MD protocol).    There.ex.:  No UBE (see MD protocol).    See updated HEP  Standing L shoulder scaption with wand (mirror feedback) 10x2.    Standing white ball at stair rail 10x2 (bilateral/unilateral on L)- PT assist for safety.    Standing scap. Retraction/ 2# bicep curls (mirror feedback)- 10x2.    Supine L shoulder serratus punches (10x2).  Supine  L elbow extension stretches.    Manual tx.:   Supine L shoulder AA/PROM 10x (per MD protocol)  Reassessment of L shoulder incision.  Pt. Has no drainage noted at proximal/mid./distal aspects of incision but gapping at proximal and distal aspect of incision (PT cleaned area and applied 1 steristrips).  The areas of gapping are wet but no drainage.  Pt. Has 2 more days of antibiotics and returns to MD 5/22.  Gentle STM to L UT/posterior to anterior deltoid/ proximal biceps)- 4 min.       Pt. Will ice L shoulder at home.      PATIENT EDUCATION: Education  details: HEP/ incision healing/care Person educated: Patient Education method: Medical illustrator Education comprehension: verbalized understanding and returned demonstration  HOME EXERCISE PROGRAM: Access Code: [email or web address] URL: [email or web address] Date: 05/21/2022 Prepared by: Dorene Grebe  Exercises - Seated Shoulder Flexion AAROM with Pulley Behind  - 2 x daily - 7 x weekly - 1 sets - 20 reps - Seated Shoulder Scaption AAROM with Pulley at Side  - 2 x daily - 7 x weekly - 1 sets - 20 reps - Standing Shoulder Flexion ROM with Dowel  - 2 x daily - 7 x weekly - 2 sets - 10 reps - Standing Shoulder Abduction AAROM with Dowel  - 2 x daily - 7 x weekly - 2 sets - 10 reps - Standing Isometric Shoulder Flexion with Doorway - Arm Bent  - 2 x daily - 7 x weekly - 1 sets - 10 reps - Standing Isometric Shoulder External Rotation with Doorway and Towel Roll  - 2 x daily - 7 x weekly - 1 sets - 10 reps - Seated Scapular Retraction  - 2 x daily - 7 x weekly - 1 sets - 10 reps  ASSESSMENT:  CLINICAL IMPRESSION: Pt. Understands MD protocol and able to tolerate gentle L shoulder A/AROM (flexion/ abduction).  Pt. Presents with slow healing incision with1 steristrips present due to gapping.  Slight increase in L shoulder pain during gentle L elbow extension/ sh. Abduction.  Pt. Continues to progress with L shoulder AROM in seated/standing posture with good technique, but limited with sh. Abduction.   Pt. Will benefit from skilled PT services to increase L shoulder ROM/ strength as compared to R shoulder to improve pain-free mobility.    OBJECTIVE IMPAIRMENTS: decreased activity tolerance, decreased endurance, decreased mobility, decreased ROM, decreased strength, hypomobility, increased edema, impaired flexibility, impaired UE functional use, improper body mechanics, postural dysfunction, obesity, and pain.   ACTIVITY LIMITATIONS: carrying, lifting, sleeping, bathing, toileting,  dressing, self feeding, reach over head, hygiene/grooming, and caring for others  PARTICIPATION LIMITATIONS: meal prep, cleaning, laundry, driving, shopping, and community activity  PERSONAL FACTORS: Fitness and Past/current experiences are also affecting patient's functional outcome.   REHAB POTENTIAL: Good  CLINICAL DECISION MAKING: Evolving/moderate complexity  EVALUATION COMPLEXITY: Moderate   GOALS: Goals reviewed with patient? Yes  SHORT TERM GOALS: Target date: 06/02/22  Pt. Independent with HEP to increase L shoulder AROM to [facility] as compared to R shoulder to improve overhead reaching/ ADL.   Baseline:  See above Goal status: INITIAL   LONG TERM GOALS: Target date: 06/30/22  Pt. Will increase FOTO to 64 to improve pain-free UE mobility.   Baseline: initial 42 Goal status: INITIAL  2.  Pt. Able to sleep in bed with proper positioning and no increase c/o L shoulder pain.   Baseline: sleeping in recliner Goal status: INITIAL  3.  Pt. Will increase L  shoulder strength to grossly 4/5 MMT to improve functional tasks/ carrying.   Baseline: TBD Goal status: INITIAL  4.  Pt. Able to return to household tasks with no L shoulder pain or limitations.   Baseline: see restrictions/ MD protocol.  Goal status: INITIAL   PLAN:  PT FREQUENCY: 2x/week  PT DURATION: 8 weeks  PLANNED INTERVENTIONS: Therapeutic exercises, Therapeutic activity, Neuromuscular re-education, Balance training, Gait training, Patient/Family education, Self Care, Joint mobilization, Electrical stimulation, Cryotherapy, Moist heat, scar mobilization, Manual therapy, and Re-evaluation  PLAN FOR NEXT SESSION: Send MD progress note.    Cammie Mcgee, PT, DPT # [id number] 05/21/2022, 1:41 PM

## 2022-05-26 ENCOUNTER — Encounter: Payer: Self-pay | Admitting: Physical Therapy

## 2022-05-26 ENCOUNTER — Ambulatory Visit: Payer: Medicare HMO

## 2022-05-26 DIAGNOSIS — M25612 Stiffness of left shoulder, not elsewhere classified: Secondary | ICD-10-CM

## 2022-05-26 DIAGNOSIS — G8929 Other chronic pain: Secondary | ICD-10-CM

## 2022-05-26 DIAGNOSIS — Z96612 Presence of left artificial shoulder joint: Secondary | ICD-10-CM

## 2022-05-26 DIAGNOSIS — M6281 Muscle weakness (generalized): Secondary | ICD-10-CM

## 2022-05-26 NOTE — Therapy (Signed)
OUTPATIENT PHYSICAL THERAPY SHOULDER TREATMENT  Patient Name: Darlene Chen MRN: [id number] DOB:[birth year], 72 y.o., female Today's Date: 05/26/2022  END OF SESSION:  PT End of Session - 05/26/22 1259     Visit Number 7    Number of Visits 16    Date for PT Re-Evaluation 06/30/22    PT Start Time 1300    PT Stop Time 1339    PT Time Calculation (min) 39 min    Activity Tolerance Patient tolerated treatment well    Behavior During Therapy WFL for tasks assessed/performed            Past Medical History:  Diagnosis Date   Diabetes mellitus without complication (HCC)    High cholesterol    Hypertension    Neutropenia (HCC)    Neutropenia (HCC)    Past Surgical History:  Procedure Laterality Date   arthroscopic knee surgery  on left 2000     BREAST BIOPSY Right 2019   bx/clip-neg   LEFT HEART CATH AND CORONARY ANGIOGRAPHY N/A 02/09/2017   Procedure: LEFT HEART CATH AND CORONARY ANGIOGRAPHY;  Surgeon: Alwyn Pea, MD;  Location: [facility] INVASIVE CV LAB;  Service: Cardiovascular;  Laterality: N/A;   REDUCTION MAMMAPLASTY Bilateral    1990   SHOULDER SURGERY Right    TUBAL LIGATION  1985   UMBILICAL HERNIA REPAIR  2007   Patient Active Problem List   Diagnosis Date Noted   Pancreatic cyst 11/21/2018   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HCC) 09/21/2018   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   Leucopenia 07/06/2018   B12 deficiency 07/06/2018   Fatty liver disease, nonalcoholic 12/12/2017   Onychomycosis 07/20/2017   Hypertensive disorder 12/03/2016   Intracranial aneurysm 12/03/2016   Family history of brain aneurysm 08/07/2016   Obstructive sleep apnea 06/17/2016   Hyperglycemia, unspecified 01/29/2016   Morbid obesity with BMI of 45.0-49.9, adult (HCC) 04/08/2015   Myalgia 11/15/2014   Costochondritis 04/11/2014   Osteopenia 04/11/2014   Prediabetes 02/15/2014   Hypercholesterolemia 09/26/2013   Degenerative arthritis of left knee 06/02/2013     PCP: Rayetta Humphrey, MD  REFERRING PROVIDER: Holland Commons, MD  REFERRING DIAG: s/p L reverse total shoulder replacement  THERAPY DIAG:  Status post reverse total shoulder replacement, left  Chronic left shoulder pain  Shoulder joint stiffness, left  Muscle weakness (generalized)  Rationale for Evaluation and Treatment: Rehabilitation  ONSET DATE: 04/14/22  SUBJECTIVE:                                                                                                                                                                                      SUBJECTIVE STATEMENT:  EVALUATION Pt. S/p L reverse  total shoulder replacement on 04/14/22.  Pt. Had previous R total shoulder.  Pt. Reports no L shoulder pain at rest and 7-8/10 pain with movement.  Pt. Currently wearing a sling and slowly weaning out of sling with home activities.  Pt. Is sleeping in recliner due to difficulty getting comfortable at night.   Hand dominance: Right  PERTINENT HISTORY: Pt. Well known to PT clinic.  See previous PT/MD notes.   PAIN:  Are you having pain? Yes: NPRS scale: 7/10 Pain location: L shoulder Pain description: aching/ sharp Aggravating factors: PROM Relieving factors: rest/ ice  PRECAUTIONS: Shoulder  WEIGHT BEARING RESTRICTIONS: No  FALLS:  Has patient fallen in last 6 months? No  LIVING ENVIRONMENT: Lives with: lives with their spouse Lives in: House/apartment Has following equipment at home: Single point cane  OCCUPATION: Retired  PLOF: Independent  PATIENT GOALS:  Increase L shoulder ROM/ strength/ pain-free mobility  NEXT MD VISIT: approx. 6 weeks.  OBJECTIVE:   DIAGNOSTIC FINDINGS:  See imaging  PATIENT SURVEYS:  FOTO initial 42/ goal 69  COGNITION: Overall cognitive status: Within functional limits for tasks assessed     SENSATION: WFL  POSTURE: Rounded shoulders.    UPPER EXTREMITY ROM:   Passive ROM Right eval Left eval  Shoulder flexion 146  deg. 95 deg.  Shoulder extension  NT  Shoulder abduction 125 deg. 80 deg.  Shoulder adduction    Shoulder internal rotation 75 deg. 30 deg.  Shoulder external rotation 75 deg. 0 deg.  Elbow flexion [facility] WFL  Elbow extension [facility] WFL  Wrist flexion    Wrist extension    Wrist ulnar deviation    Wrist radial deviation    Wrist pronation    Wrist supination    (Blank rows = not tested)  UPPER EXTREMITY MMT:   TBD when appropriate.  See MD protocol.  Grip strength: R 52#/ L 33#   SHOULDER SPECIAL TESTS: N/A  JOINT MOBILITY TESTING:  N/A  PALPATION:  Minimal tenderness around incision.  Incision is scabby/ dry and has 3 area of drainage (clear)- see picture.  Pt. Instructed to contact MD if incision worsens.   5/7: Supine L shoulder PROM: flexion 124 deg./ abduction 90 deg.    TODAY'S TREATMENT:                                                                                                                                         DATE: 05/26/2022  Subjective: Pt reports no pain currently. Seeing surgeon for her incision tomorrow, 05/27/22.   There.ex:  No UBE (see MD protocol).    See updated HEP  Supine L shoulder AA/PROM 10x (per MD protocol): flexion/abduction/ER  Reviewed standing shoulder ER/IR isometrics with towel roll: 2x10, 2-3 sec holds. Min to mod VC's for form/technique. PT demo prior to completion.   Standing L shoulder scaption with wand (mirror feedback) 10x2.  Gentle standing scap retractions with RTB: 3x12  Standing white ball at stair rail 10x2 (bilateral/unilateral on L)- PT assist for safety.     Seated 2# bicep curls: 10x2.    Supine L shoulder serratus punches 2x10.   Pt provided ice pack for L shoulder post treatment. Not Billed.    PATIENT EDUCATION: Education details: HEP/ incision healing/care Person educated: Patient Education method: Medical illustrator Education comprehension: verbalized understanding and returned  demonstration  HOME EXERCISE PROGRAM: Access Code: [email or web address] URL: [email or web address] Date: 05/21/2022 Prepared by: Dorene Grebe  Exercises - Seated Shoulder Flexion AAROM with Pulley Behind  - 2 x daily - 7 x weekly - 1 sets - 20 reps - Seated Shoulder Scaption AAROM with Pulley at Side  - 2 x daily - 7 x weekly - 1 sets - 20 reps - Standing Shoulder Flexion ROM with Dowel  - 2 x daily - 7 x weekly - 2 sets - 10 reps - Standing Shoulder Abduction AAROM with Dowel  - 2 x daily - 7 x weekly - 2 sets - 10 reps - Standing Isometric Shoulder Flexion with Doorway - Arm Bent  - 2 x daily - 7 x weekly - 1 sets - 10 reps - Standing Isometric Shoulder External Rotation with Doorway and Towel Roll  - 2 x daily - 7 x weekly - 1 sets - 10 reps - Seated Scapular Retraction  - 2 x daily - 7 x weekly - 1 sets - 10 reps  ASSESSMENT:  CLINICAL IMPRESSION: Continuing PT POC with maintaining adequate LUE mobility and strengthening per protocol guidelines. Pt and author reviewed updated HEP on IR/ER isometrics as pt had questions on how to perform adequately. Pt still planning on f/u with orthopedic surgeon tomorrow to assess pt's incision. Encouraging pt to f/u per POC and schedule on Thursday. Pt making great progress in her rehab but remains most limited in her L shoulder abduction. Pt will benefit from skilled PT services to increase L shoulder ROM/ strength as compared to R shoulder to improve pain-free mobility.    OBJECTIVE IMPAIRMENTS: decreased activity tolerance, decreased endurance, decreased mobility, decreased ROM, decreased strength, hypomobility, increased edema, impaired flexibility, impaired UE functional use, improper body mechanics, postural dysfunction, obesity, and pain.   ACTIVITY LIMITATIONS: carrying, lifting, sleeping, bathing, toileting, dressing, self feeding, reach over head, hygiene/grooming, and caring for others  PARTICIPATION LIMITATIONS: meal prep, cleaning,  laundry, driving, shopping, and community activity  PERSONAL FACTORS: Fitness and Past/current experiences are also affecting patient's functional outcome.   REHAB POTENTIAL: Good  CLINICAL DECISION MAKING: Evolving/moderate complexity  EVALUATION COMPLEXITY: Moderate   GOALS: Goals reviewed with patient? Yes  SHORT TERM GOALS: Target date: 06/02/22  Pt. Independent with HEP to increase L shoulder AROM to [facility] as compared to R shoulder to improve overhead reaching/ ADL.   Baseline:  See above Goal status: INITIAL   LONG TERM GOALS: Target date: 06/30/22  Pt. Will increase FOTO to 64 to improve pain-free UE mobility.   Baseline: initial 42 Goal status: INITIAL  2.  Pt. Able to sleep in bed with proper positioning and no increase c/o L shoulder pain.   Baseline: sleeping in recliner Goal status: INITIAL  3.  Pt. Will increase L shoulder strength to grossly 4/5 MMT to improve functional tasks/ carrying.   Baseline: TBD Goal status: INITIAL  4.  Pt. Able to return to household tasks with no L shoulder pain or limitations.   Baseline: see restrictions/ MD protocol.  Goal status: INITIAL   PLAN:  PT FREQUENCY: 2x/week  PT DURATION: 8 weeks  PLANNED INTERVENTIONS: Therapeutic exercises, Therapeutic activity, Neuromuscular re-education, Balance training, Gait training, Patient/Family education, Self Care, Joint mobilization, Electrical stimulation, Cryotherapy, Moist heat, scar mobilization, Manual therapy, and Re-evaluation  PLAN FOR NEXT SESSION: Send MD progress note.    Delphia Grates. Fairly IV, PT, DPT Physical Therapist- [place]  [facility]  05/26/2022, 1:41 PM

## 2022-05-28 ENCOUNTER — Ambulatory Visit: Payer: Medicare HMO

## 2022-05-28 ENCOUNTER — Encounter: Payer: Self-pay | Admitting: Physical Therapy

## 2022-05-28 DIAGNOSIS — M6281 Muscle weakness (generalized): Secondary | ICD-10-CM

## 2022-05-28 DIAGNOSIS — M25612 Stiffness of left shoulder, not elsewhere classified: Secondary | ICD-10-CM

## 2022-05-28 DIAGNOSIS — Z96612 Presence of left artificial shoulder joint: Secondary | ICD-10-CM | POA: Diagnosis not present

## 2022-05-28 DIAGNOSIS — G8929 Other chronic pain: Secondary | ICD-10-CM

## 2022-05-28 NOTE — Therapy (Signed)
OUTPATIENT PHYSICAL THERAPY SHOULDER TREATMENT  Patient Name: Darlene Chen MRN: [id number] DOB:1950-04-26, 72 y.o., female Today's Date: 05/28/2022  END OF SESSION:  PT End of Session - 05/28/22 1301     Visit Number 8    Number of Visits 16    Date for PT Re-Evaluation 06/30/22    PT Start Time 1301    PT Stop Time 1336    PT Time Calculation (min) 35 min    Activity Tolerance Patient tolerated treatment well    Behavior During Therapy WFL for tasks assessed/performed            Past Medical History:  Diagnosis Date   Diabetes mellitus without complication (HCC)    High cholesterol    Hypertension    Neutropenia (HCC)    Neutropenia (HCC)    Past Surgical History:  Procedure Laterality Date   arthroscopic knee surgery  on left 2000     BREAST BIOPSY Right 2019   bx/clip-neg   LEFT HEART CATH AND CORONARY ANGIOGRAPHY N/A 02/09/2017   Procedure: LEFT HEART CATH AND CORONARY ANGIOGRAPHY;  Surgeon: Alwyn Pea, MD;  Location: [facility] INVASIVE CV LAB;  Service: Cardiovascular;  Laterality: N/A;   REDUCTION MAMMAPLASTY Bilateral    1990   SHOULDER SURGERY Right    TUBAL LIGATION  1985   UMBILICAL HERNIA REPAIR  2007   Patient Active Problem List   Diagnosis Date Noted   Pancreatic cyst 11/21/2018   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HCC) 09/21/2018   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   Leucopenia 07/06/2018   B12 deficiency 07/06/2018   Fatty liver disease, nonalcoholic 12/12/2017   Onychomycosis 07/20/2017   Hypertensive disorder 12/03/2016   Intracranial aneurysm 12/03/2016   Family history of brain aneurysm 08/07/2016   Obstructive sleep apnea 06/17/2016   Hyperglycemia, unspecified 01/29/2016   Morbid obesity with BMI of 45.0-49.9, adult (HCC) 04/08/2015   Myalgia 11/15/2014   Costochondritis 04/11/2014   Osteopenia 04/11/2014   Prediabetes 02/15/2014   Hypercholesterolemia 09/26/2013   Degenerative arthritis of left knee 06/02/2013     PCP: Rayetta Humphrey, MD  REFERRING PROVIDER: Holland Commons, MD  REFERRING DIAG: s/p L reverse total shoulder replacement  THERAPY DIAG:  Status post reverse total shoulder replacement, left  Chronic left shoulder pain  Shoulder joint stiffness, left  Muscle weakness (generalized)  Rationale for Evaluation and Treatment: Rehabilitation  ONSET DATE: 04/14/22  SUBJECTIVE:                                                                                                                                                                                      SUBJECTIVE STATEMENT:  EVALUATION Pt. S/p L reverse  total shoulder replacement on 04/14/22.  Pt. Had previous R total shoulder.  Pt. Reports no L shoulder pain at rest and 7-8/10 pain with movement.  Pt. Currently wearing a sling and slowly weaning out of sling with home activities.  Pt. Is sleeping in recliner due to difficulty getting comfortable at night.   Hand dominance: Right  PERTINENT HISTORY: Pt. Well known to PT clinic.  See previous PT/MD notes.   PAIN:  Are you having pain? Yes: NPRS scale: 7/10 Pain location: L shoulder Pain description: aching/ sharp Aggravating factors: PROM Relieving factors: rest/ ice  PRECAUTIONS: Shoulder  WEIGHT BEARING RESTRICTIONS: No  FALLS:  Has patient fallen in last 6 months? No  LIVING ENVIRONMENT: Lives with: lives with their spouse Lives in: House/apartment Has following equipment at home: Single point cane  OCCUPATION: Retired  PLOF: Independent  PATIENT GOALS:  Increase L shoulder ROM/ strength/ pain-free mobility  NEXT MD VISIT: approx. 6 weeks.  OBJECTIVE:   DIAGNOSTIC FINDINGS:  See imaging  PATIENT SURVEYS:  FOTO initial 42/ goal 55  COGNITION: Overall cognitive status: Within functional limits for tasks assessed     SENSATION: WFL  POSTURE: Rounded shoulders.    UPPER EXTREMITY ROM:   Passive ROM Right eval Left eval  Shoulder flexion 146  deg. 95 deg.  Shoulder extension  NT  Shoulder abduction 125 deg. 80 deg.  Shoulder adduction    Shoulder internal rotation 75 deg. 30 deg.  Shoulder external rotation 75 deg. 0 deg.  Elbow flexion [facility] WFL  Elbow extension [facility] WFL  Wrist flexion    Wrist extension    Wrist ulnar deviation    Wrist radial deviation    Wrist pronation    Wrist supination    (Blank rows = not tested)  UPPER EXTREMITY MMT:   TBD when appropriate.  See MD protocol.  Grip strength: R 52#/ L 33#   SHOULDER SPECIAL TESTS: N/A  JOINT MOBILITY TESTING:  N/A  PALPATION:  Minimal tenderness around incision.  Incision is scabby/ dry and has 3 area of drainage (clear)- see picture.  Pt. Instructed to contact MD if incision worsens.   5/7: Supine L shoulder PROM: flexion 124 deg./ abduction 90 deg.    TODAY'S TREATMENT:                                                                                                                                         DATE: 05/28/2022  Subjective: Pt reports no pain currently. Reports being on antibiotic for incision after seeing MD yesterday.    There.ex:  No UBE (see MD protocol).    See updated HEP  Elevated table to 25 deg, L shoulder scaption: 2x8. Some minA with descent by PT due to increase in shoulder pain.   Slightly elevated table for pt comfort, L shoulder serratus punches: 3x10  R side lying, L shoulder abduction short lever  arm: 3x10   L shoulder at neutral, R and L ER/IR: x20/plane   Standing L shoulder scaption with wand (mirror feedback, spine unsupported) for scapulohumeral rhythm: 2x12   Gentle standing scap retractions with RTB: 3x12  Functional L shoulder IR: 3x12, L thumb reaching approximately gluteal cleft/crease. No pain.      PATIENT EDUCATION: Education details: HEP/ incision healing/care Person educated: Patient Education method: Medical illustrator Education comprehension: verbalized understanding and returned  demonstration  HOME EXERCISE PROGRAM: Access Code: [email or web address] URL: [email or web address] Date: 05/21/2022 Prepared by: Dorene Grebe  Exercises - Seated Shoulder Flexion AAROM with Pulley Behind  - 2 x daily - 7 x weekly - 1 sets - 20 reps - Seated Shoulder Scaption AAROM with Pulley at Side  - 2 x daily - 7 x weekly - 1 sets - 20 reps - Standing Shoulder Flexion ROM with Dowel  - 2 x daily - 7 x weekly - 2 sets - 10 reps - Standing Shoulder Abduction AAROM with Dowel  - 2 x daily - 7 x weekly - 2 sets - 10 reps - Standing Isometric Shoulder Flexion with Doorway - Arm Bent  - 2 x daily - 7 x weekly - 1 sets - 10 reps - Standing Isometric Shoulder External Rotation with Doorway and Towel Roll  - 2 x daily - 7 x weekly - 1 sets - 10 reps - Seated Scapular Retraction  - 2 x daily - 7 x weekly - 1 sets - 10 reps  ASSESSMENT:  CLINICAL IMPRESSION: Pt tolerating some additional AROM within MD's protocol. Performing AROM with spine support on elevated mat table and relying on AAROM in standing where spine is unsupported. Pt with mild pain response with AROM with eccentric descent. Pt able to tolerate gentle functional shoulder IR behind back without pain reaching gluteal cleft. Pt encouraged to continue POC to tolerance. Pt will benefit from skilled PT services to increase L shoulder ROM/ strength as compared to R shoulder to improve pain-free mobility.    OBJECTIVE IMPAIRMENTS: decreased activity tolerance, decreased endurance, decreased mobility, decreased ROM, decreased strength, hypomobility, increased edema, impaired flexibility, impaired UE functional use, improper body mechanics, postural dysfunction, obesity, and pain.   ACTIVITY LIMITATIONS: carrying, lifting, sleeping, bathing, toileting, dressing, self feeding, reach over head, hygiene/grooming, and caring for others  PARTICIPATION LIMITATIONS: meal prep, cleaning, laundry, driving, shopping, and community  activity  PERSONAL FACTORS: Fitness and Past/current experiences are also affecting patient's functional outcome.   REHAB POTENTIAL: Good  CLINICAL DECISION MAKING: Evolving/moderate complexity  EVALUATION COMPLEXITY: Moderate   GOALS: Goals reviewed with patient? Yes  SHORT TERM GOALS: Target date: 06/02/22  Pt. Independent with HEP to increase L shoulder AROM to [facility] as compared to R shoulder to improve overhead reaching/ ADL.   Baseline:  See above Goal status: INITIAL   LONG TERM GOALS: Target date: 06/30/22  Pt. Will increase FOTO to 64 to improve pain-free UE mobility.   Baseline: initial 42 Goal status: INITIAL  2.  Pt. Able to sleep in bed with proper positioning and no increase c/o L shoulder pain.   Baseline: sleeping in recliner Goal status: INITIAL  3.  Pt. Will increase L shoulder strength to grossly 4/5 MMT to improve functional tasks/ carrying.   Baseline: TBD Goal status: INITIAL  4.  Pt. Able to return to household tasks with no L shoulder pain or limitations.   Baseline: see restrictions/ MD protocol.  Goal status: INITIAL   PLAN:  PT FREQUENCY: 2x/week  PT DURATION: 8 weeks  PLANNED INTERVENTIONS: Therapeutic exercises, Therapeutic activity, Neuromuscular re-education, Balance training, Gait training, Patient/Family education, Self Care, Joint mobilization, Electrical stimulation, Cryotherapy, Moist heat, scar mobilization, Manual therapy, and Re-evaluation  PLAN FOR NEXT SESSION: Send MD progress note.    Delphia Grates. Fairly IV, PT, DPT Physical Therapist- [place]  [facility]  05/28/2022, 1:49 PM

## 2022-06-02 ENCOUNTER — Ambulatory Visit: Payer: Medicare HMO

## 2022-06-02 ENCOUNTER — Ambulatory Visit: Payer: Medicare HMO | Admitting: Physical Therapy

## 2022-06-02 DIAGNOSIS — Z96612 Presence of left artificial shoulder joint: Secondary | ICD-10-CM | POA: Diagnosis not present

## 2022-06-02 DIAGNOSIS — M6281 Muscle weakness (generalized): Secondary | ICD-10-CM

## 2022-06-02 DIAGNOSIS — G8929 Other chronic pain: Secondary | ICD-10-CM

## 2022-06-02 DIAGNOSIS — M25612 Stiffness of left shoulder, not elsewhere classified: Secondary | ICD-10-CM

## 2022-06-02 NOTE — Therapy (Signed)
OUTPATIENT PHYSICAL THERAPY SHOULDER TREATMENT  Patient Name: Analisse Gethers MRN: [id number] DOB:[birth year], 72 y.o., female Today's Date: 06/02/2022  END OF SESSION:  PT End of Session - 06/02/22 1254     Visit Number 9    Number of Visits 16    Date for PT Re-Evaluation 06/30/22    PT Start Time 1254    PT Stop Time 1346    PT Time Calculation (min) 52 min    Activity Tolerance Patient tolerated treatment well    Behavior During Therapy WFL for tasks assessed/performed            Past Medical History:  Diagnosis Date   Diabetes mellitus without complication (HCC)    High cholesterol    Hypertension    Neutropenia (HCC)    Neutropenia (HCC)    Past Surgical History:  Procedure Laterality Date   arthroscopic knee surgery  on left 2000     BREAST BIOPSY Right 2019   bx/clip-neg   LEFT HEART CATH AND CORONARY ANGIOGRAPHY N/A 02/09/2017   Procedure: LEFT HEART CATH AND CORONARY ANGIOGRAPHY;  Surgeon: Alwyn Pea, MD;  Location: [facility] INVASIVE CV [facility];  Service: Cardiovascular;  Laterality: N/A;   REDUCTION MAMMAPLASTY Bilateral    1990   SHOULDER SURGERY Right    TUBAL LIGATION  1985   UMBILICAL HERNIA REPAIR  2007   Patient Active Problem List   Diagnosis Date Noted   Pancreatic cyst 11/21/2018   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HCC) 09/21/2018   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   Leucopenia 07/06/2018   B12 deficiency 07/06/2018   Fatty liver disease, nonalcoholic 12/12/2017   Onychomycosis 07/20/2017   Hypertensive disorder 12/03/2016   Intracranial aneurysm 12/03/2016   Family history of brain aneurysm 08/07/2016   Obstructive sleep apnea 06/17/2016   Hyperglycemia, unspecified 01/29/2016   Morbid obesity with BMI of 45.0-49.9, adult (HCC) 04/08/2015   Myalgia 11/15/2014   Costochondritis 04/11/2014   Osteopenia 04/11/2014   Prediabetes 02/15/2014   Hypercholesterolemia 09/26/2013   Degenerative arthritis of left knee 06/02/2013     PCP: Rayetta Humphrey, MD  REFERRING PROVIDER: Holland Commons, MD  REFERRING DIAG: s/p L reverse total shoulder replacement  THERAPY DIAG:  Status post reverse total shoulder replacement, left  Chronic left shoulder pain  Shoulder joint stiffness, left  Muscle weakness (generalized)  Rationale for Evaluation and Treatment: Rehabilitation  ONSET DATE: 04/14/22  SUBJECTIVE:                                                                                                                                                                                      SUBJECTIVE STATEMENT:  EVALUATION Pt. S/p L reverse  total shoulder replacement on 04/14/22.  Pt. Had previous R total shoulder.  Pt. Reports no L shoulder pain at rest and 7-8/10 pain with movement.  Pt. Currently wearing a sling and slowly weaning out of sling with home activities.  Pt. Is sleeping in recliner due to difficulty getting comfortable at night.   Hand dominance: Right  PERTINENT HISTORY: Pt. Well known to PT clinic.  See previous PT/MD notes.   PAIN:  Are you having pain? Yes: NPRS scale: 7/10 Pain location: L shoulder Pain description: aching/ sharp Aggravating factors: PROM Relieving factors: rest/ ice  PRECAUTIONS: Shoulder  WEIGHT BEARING RESTRICTIONS: No  FALLS:  Has patient fallen in last 6 months? No  LIVING ENVIRONMENT: Lives with: lives with their spouse Lives in: House/apartment Has following equipment at home: Single point cane  OCCUPATION: Retired  PLOF: Independent  PATIENT GOALS:  Increase L shoulder ROM/ strength/ pain-free mobility  NEXT MD VISIT: approx. 6 weeks.  OBJECTIVE:   DIAGNOSTIC FINDINGS:  See imaging  PATIENT SURVEYS:  FOTO initial 42/ goal 71  COGNITION: Overall cognitive status: Within functional limits for tasks assessed     SENSATION: WFL  POSTURE: Rounded shoulders.    UPPER EXTREMITY ROM:   Passive ROM Right eval Left eval  Shoulder flexion 146  deg. 95 deg.  Shoulder extension  NT  Shoulder abduction 125 deg. 80 deg.  Shoulder adduction    Shoulder internal rotation 75 deg. 30 deg.  Shoulder external rotation 75 deg. 0 deg.  Elbow flexion [facility] WFL  Elbow extension [facility] WFL  Wrist flexion    Wrist extension    Wrist ulnar deviation    Wrist radial deviation    Wrist pronation    Wrist supination    (Blank rows = not tested)  UPPER EXTREMITY MMT:   TBD when appropriate.  See MD protocol.  Grip strength: R 52#/ L 33#   SHOULDER SPECIAL TESTS: N/A  JOINT MOBILITY TESTING:  N/A  PALPATION:  Minimal tenderness around incision.  Incision is scabby/ dry and has 3 area of drainage (clear)- see picture.  Pt. Instructed to contact MD if incision worsens.   5/7: Supine L shoulder PROM: flexion 124 deg./ abduction 90 deg.    TODAY'S TREATMENT:                                                                                                                                         DATE: 06/02/2022  Subjective: Pt reports no pain currently.  Pt. reports having to be on antibiotic for 7 more days.  Pt reports she is more active with household chores.  Pt. Unable to drive for 2 more weeks per MD.    There.ex:  No UBE (see MD protocol).    Standing wall ladder: 5x shoulder flexion (marked increase 1 rung- see sticker).    Seated wand ex.:  shoulder flexion/ chest press/ abduction  10x2 each (good upright posture).  Standing wand ex.:  IR 10x2.    Standing scapular retraction/extension/flexion: 10x2 with RTB.      Standing white ball at stair handrail: 10x with light to no UE assist.   Supine L shoulder flexion/ serratus punches AROM (focus on technique/control).    Supine L elbow contract-relax to increase L elbow extension.  Slight increase in pain.     PATIENT EDUCATION: Education details: HEP/ incision healing/care Person educated: Patient Education method: Medical illustrator Education comprehension:  verbalized understanding and returned demonstration  HOME EXERCISE PROGRAM: Access Code: [email or web address] URL: [email or web address] Date: 05/21/2022 Prepared by: Dorene Grebe  Exercises - Seated Shoulder Flexion AAROM with Pulley Behind  - 2 x daily - 7 x weekly - 1 sets - 20 reps - Seated Shoulder Scaption AAROM with Pulley at Side  - 2 x daily - 7 x weekly - 1 sets - 20 reps - Standing Shoulder Flexion ROM with Dowel  - 2 x daily - 7 x weekly - 2 sets - 10 reps - Standing Shoulder Abduction AAROM with Dowel  - 2 x daily - 7 x weekly - 2 sets - 10 reps - Standing Isometric Shoulder Flexion with Doorway - Arm Bent  - 2 x daily - 7 x weekly - 1 sets - 10 reps - Standing Isometric Shoulder External Rotation with Doorway and Towel Roll  - 2 x daily - 7 x weekly - 1 sets - 10 reps - Seated Scapular Retraction  - 2 x daily - 7 x weekly - 1 sets - 10 reps  ASSESSMENT:  CLINICAL IMPRESSION: Pt. Increasing shoulder AROM within MD's protocol. Pt able to tolerate gentle functional shoulder IR behind back without pain reaching gluteal cleft.  Good technique with light resistance scapular ex.  Will issue new HEP next tx. Session.  Pt encouraged to continue POC to tolerance.   Limited standing ex. With shoulders secondary to knee issues/ pain.  Pt will benefit from skilled PT services to increase L shoulder ROM/ strength as compared to R shoulder to improve pain-free mobility.    OBJECTIVE IMPAIRMENTS: decreased activity tolerance, decreased endurance, decreased mobility, decreased ROM, decreased strength, hypomobility, increased edema, impaired flexibility, impaired UE functional use, improper body mechanics, postural dysfunction, obesity, and pain.   ACTIVITY LIMITATIONS: carrying, lifting, sleeping, bathing, toileting, dressing, self feeding, reach over head, hygiene/grooming, and caring for others  PARTICIPATION LIMITATIONS: meal prep, cleaning, laundry, driving, shopping, and community  activity  PERSONAL FACTORS: Fitness and Past/current experiences are also affecting patient's functional outcome.   REHAB POTENTIAL: Good  CLINICAL DECISION MAKING: Evolving/moderate complexity  EVALUATION COMPLEXITY: Moderate   GOALS: Goals reviewed with patient? Yes  SHORT TERM GOALS: Target date: 06/02/22  Pt. Independent with HEP to increase L shoulder AROM to [facility] as compared to R shoulder to improve overhead reaching/ ADL.   Baseline:  See above Goal status: Partially met   LONG TERM GOALS: Target date: 06/30/22  Pt. Will increase FOTO to 64 to improve pain-free UE mobility.   Baseline: initial 42 Goal status: INITIAL  2.  Pt. Able to sleep in bed with proper positioning and no increase c/o L shoulder pain.   Baseline: sleeping in recliner Goal status: INITIAL  3.  Pt. Will increase L shoulder strength to grossly 4/5 MMT to improve functional tasks/ carrying.   Baseline: TBD Goal status: INITIAL  4.  Pt. Able to return to household tasks with no L shoulder pain or limitations.  Baseline: see restrictions/ MD protocol.  Goal status: INITIAL   PLAN:  PT FREQUENCY: 2x/week  PT DURATION: 8 weeks  PLANNED INTERVENTIONS: Therapeutic exercises, Therapeutic activity, Neuromuscular re-education, Balance training, Gait training, Patient/Family education, Self Care, Joint mobilization, Electrical stimulation, Cryotherapy, Moist heat, scar mobilization, Manual therapy, and Re-evaluation  PLAN FOR NEXT SESSION: Issue new HEP with RTB (see MD protocol).    Cammie Mcgee, PT, DPT # [phone] Physical Therapist- [facility]  06/02/2022, 6:32 PM

## 2022-06-04 ENCOUNTER — Ambulatory Visit: Payer: Medicare HMO | Admitting: Physical Therapy

## 2022-06-04 ENCOUNTER — Encounter: Payer: Self-pay | Admitting: Physical Therapy

## 2022-06-04 DIAGNOSIS — M25612 Stiffness of left shoulder, not elsewhere classified: Secondary | ICD-10-CM

## 2022-06-04 DIAGNOSIS — Z96612 Presence of left artificial shoulder joint: Secondary | ICD-10-CM

## 2022-06-04 DIAGNOSIS — G8929 Other chronic pain: Secondary | ICD-10-CM

## 2022-06-04 DIAGNOSIS — M6281 Muscle weakness (generalized): Secondary | ICD-10-CM

## 2022-06-04 NOTE — Therapy (Signed)
OUTPATIENT PHYSICAL THERAPY SHOULDER TREATMENT Physical Therapy Progress Note  Dates of reporting period  05/05/22   to  06/04/22   Patient Name: Darlene Chen MRN: [id number] DOB:10/31/1950, 72 y.o., female Today's Date: 06/04/2022  END OF SESSION:  PT End of Session - 06/04/22 1257     Visit Number 10    Number of Visits 16    Date for PT Re-Evaluation 06/30/22    PT Start Time 1257    PT Stop Time 1347    PT Time Calculation (min) 50 min    Activity Tolerance Patient tolerated treatment well    Behavior During Therapy WFL for tasks assessed/performed            Past Medical History:  Diagnosis Date   Diabetes mellitus without complication (HCC)    High cholesterol    Hypertension    Neutropenia (HCC)    Neutropenia (HCC)    Past Surgical History:  Procedure Laterality Date   arthroscopic knee surgery  on left 2000     BREAST BIOPSY Right 2019   bx/clip-neg   LEFT HEART CATH AND CORONARY ANGIOGRAPHY N/A 02/09/2017   Procedure: LEFT HEART CATH AND CORONARY ANGIOGRAPHY;  Surgeon: Alwyn Pea, MD;  Location: [facility] INVASIVE CV LAB;  Service: Cardiovascular;  Laterality: N/A;   REDUCTION MAMMAPLASTY Bilateral    1990   SHOULDER SURGERY Right    TUBAL LIGATION  1985   UMBILICAL HERNIA REPAIR  2007   Patient Active Problem List   Diagnosis Date Noted   Pancreatic cyst 11/21/2018   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HCC) 09/21/2018   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   Leucopenia 07/06/2018   B12 deficiency 07/06/2018   Fatty liver disease, nonalcoholic 12/12/2017   Onychomycosis 07/20/2017   Hypertensive disorder 12/03/2016   Intracranial aneurysm 12/03/2016   Family history of brain aneurysm 08/07/2016   Obstructive sleep apnea 06/17/2016   Hyperglycemia, unspecified 01/29/2016   Morbid obesity with BMI of 45.0-49.9, adult (HCC) 04/08/2015   Myalgia 11/15/2014   Costochondritis 04/11/2014   Osteopenia 04/11/2014   Prediabetes 02/15/2014    Hypercholesterolemia 09/26/2013   Degenerative arthritis of left knee 06/02/2013    PCP: Rayetta Humphrey, MD  REFERRING PROVIDER: Holland Commons, MD  REFERRING DIAG: s/p L reverse total shoulder replacement  THERAPY DIAG:  Status post reverse total shoulder replacement, left  Chronic left shoulder pain  Shoulder joint stiffness, left  Muscle weakness (generalized)  Rationale for Evaluation and Treatment: Rehabilitation  ONSET DATE: 04/14/22  SUBJECTIVE:  SUBJECTIVE STATEMENT:  EVALUATION Pt. S/p L reverse total shoulder replacement on 04/14/22.  Pt. Had previous R total shoulder.  Pt. Reports no L shoulder pain at rest and 7-8/10 pain with movement.  Pt. Currently wearing a sling and slowly weaning out of sling with home activities.  Pt. Is sleeping in recliner due to difficulty getting comfortable at night.   Hand dominance: Right  PERTINENT HISTORY: Pt. Well known to PT clinic.  See previous PT/MD notes.   PAIN:  Are you having pain? Yes: NPRS scale: 7/10 Pain location: L shoulder Pain description: aching/ sharp Aggravating factors: PROM Relieving factors: rest/ ice  PRECAUTIONS: Shoulder  WEIGHT BEARING RESTRICTIONS: No  FALLS:  Has patient fallen in last 6 months? No  LIVING ENVIRONMENT: Lives with: lives with their spouse Lives in: House/apartment Has following equipment at home: Single point cane  OCCUPATION: Retired  PLOF: Independent  PATIENT GOALS:  Increase L shoulder ROM/ strength/ pain-free mobility  NEXT MD VISIT: approx. 6 weeks.  OBJECTIVE:   DIAGNOSTIC FINDINGS:  See imaging  PATIENT SURVEYS:  FOTO initial 42/ goal 63  COGNITION: Overall cognitive status: Within functional limits for tasks assessed     SENSATION: WFL  POSTURE: Rounded shoulders.     UPPER EXTREMITY ROM:   Passive ROM Right eval Left eval  Shoulder flexion 146 deg. 95 deg.  Shoulder extension  NT  Shoulder abduction 125 deg. 80 deg.  Shoulder adduction    Shoulder internal rotation 75 deg. 30 deg.  Shoulder external rotation 75 deg. 0 deg.  Elbow flexion [facility] WFL  Elbow extension [facility] WFL  Wrist flexion    Wrist extension    Wrist ulnar deviation    Wrist radial deviation    Wrist pronation    Wrist supination    (Blank rows = not tested)  UPPER EXTREMITY MMT:   TBD when appropriate.  See MD protocol.  Grip strength: R 52#/ L 33#   SHOULDER SPECIAL TESTS: N/A  JOINT MOBILITY TESTING:  N/A  PALPATION:  Minimal tenderness around incision.  Incision is scabby/ dry and has 3 area of drainage (clear)- see picture.  Pt. Instructed to contact MD if incision worsens.   5/7: Supine L shoulder PROM: flexion 124 deg./ abduction 90 deg.    TODAY'S TREATMENT:                                                                                                                                         DATE: 06/04/2022  Subjective: Pt reports no pain currently.  Pt. reports having to be on antibiotic for 6 more days.  Pt states she is more active with household chores.  Pt. Unable to drive for 2 more weeks per MD.    There.ex:  No UBE (see MD protocol).    Seated L shoulder AROM: flexion/ abduction 10x each.  Banker.    Seated wand  ex.:  shoulder flexion/ chest press/ abduction 10x2 each (good upright posture).  Standing wand ex.:  IR 10x2.  ER (45 deg.)- 10x2.    Standing scapular retraction/ horizontal flexion/ bicep curls: 10x2 with RTB.      Supine L shoulder flexion/ serratus punches AROM (focus on technique/control).    Supine L elbow contract-relax to increase L elbow extension.  Slight increase in pain.    See updated HEP/ issued RTB  PATIENT EDUCATION: Education details: HEP/ incision healing/care Person educated: Patient Education  method: Explanation and Demonstration Education comprehension: verbalized understanding and returned demonstration  HOME EXERCISE PROGRAM: Access Code: [email or web address] URL: [email or web address] Date: 05/21/2022 Prepared by: Dorene Grebe  Exercises - Seated Shoulder Flexion AAROM with Pulley Behind  - 2 x daily - 7 x weekly - 1 sets - 20 reps - Seated Shoulder Scaption AAROM with Pulley at Side  - 2 x daily - 7 x weekly - 1 sets - 20 reps - Standing Shoulder Flexion ROM with Dowel  - 2 x daily - 7 x weekly - 2 sets - 10 reps - Standing Shoulder Abduction AAROM with Dowel  - 2 x daily - 7 x weekly - 2 sets - 10 reps - Standing Isometric Shoulder Flexion with Doorway - Arm Bent  - 2 x daily - 7 x weekly - 1 sets - 10 reps - Standing Isometric Shoulder External Rotation with Doorway and Towel Roll  - 2 x daily - 7 x weekly - 1 sets - 10 reps - Seated Scapular Retraction  - 2 x daily - 7 x weekly - 1 sets - 10 reps  Access Code: [email or web address] URL: [email or web address] Date: 06/04/2022 Prepared by: Dorene Grebe  Exercises - Standing Shoulder Flexion ROM with Dowel  - 2 x daily - 7 x weekly - 2 sets - 10 reps - Standing Shoulder Abduction AAROM with Dowel  - 2 x daily - 7 x weekly - 2 sets - 10 reps - Scapular Retraction with Resistance  - 1 x daily - 7 x weekly - 2 sets - 15 reps - Standing Single Arm Shoulder Flexion with Posterior Anchored Resistance  - 1 x daily - 7 x weekly - 2 sets - 15 reps - Standing Bicep Curls with Resistance  - 1 x daily - 7 x weekly - 2 sets - 15 reps  ASSESSMENT:  CLINICAL IMPRESSION: Pt. Increasing shoulder AROM within MD's protocol. Pt able to tolerate gentle functional shoulder IR behind back without pain reaching gluteal cleft.  Good technique with light resistance scapular ex.  PT issued new HEP/ RTB for strengthening ex. (Good technique/ understanding).  Pt encouraged to continue POC to tolerance.   Limited standing ex. With shoulders  secondary to knee issues/ pain.  Pt will benefit from skilled PT services to increase L shoulder ROM/ strength as compared to R shoulder to improve pain-free mobility.    OBJECTIVE IMPAIRMENTS: decreased activity tolerance, decreased endurance, decreased mobility, decreased ROM, decreased strength, hypomobility, increased edema, impaired flexibility, impaired UE functional use, improper body mechanics, postural dysfunction, obesity, and pain.   ACTIVITY LIMITATIONS: carrying, lifting, sleeping, bathing, toileting, dressing, self feeding, reach over head, hygiene/grooming, and caring for others  PARTICIPATION LIMITATIONS: meal prep, cleaning, laundry, driving, shopping, and community activity  PERSONAL FACTORS: Fitness and Past/current experiences are also affecting patient's functional outcome.   REHAB POTENTIAL: Good  CLINICAL DECISION MAKING: Evolving/moderate complexity  EVALUATION COMPLEXITY: Moderate   GOALS: Goals reviewed with patient? Yes  SHORT TERM GOALS: Target  date: 06/02/22  Pt. Independent with HEP to increase L shoulder AROM to [facility] as compared to R shoulder to improve overhead reaching/ ADL.   Baseline:  See above Goal status: Partially met   LONG TERM GOALS: Target date: 06/30/22  Pt. Will increase FOTO to 64 to improve pain-free UE mobility.   Baseline: initial 42 Goal status: INITIAL  2.  Pt. Able to sleep in bed with proper positioning and no increase c/o L shoulder pain.   Baseline: sleeping in recliner Goal status: INITIAL  3.  Pt. Will increase L shoulder strength to grossly 4/5 MMT to improve functional tasks/ carrying.   Baseline: TBD Goal status: INITIAL  4.  Pt. Able to return to household tasks with no L shoulder pain or limitations.   Baseline: see restrictions/ MD protocol.  Goal status: INITIAL   PLAN:  PT FREQUENCY: 2x/week  PT DURATION: 8 weeks  PLANNED INTERVENTIONS: Therapeutic exercises, Therapeutic activity, Neuromuscular  re-education, Balance training, Gait training, Patient/Family education, Self Care, Joint mobilization, Electrical stimulation, Cryotherapy, Moist heat, scar mobilization, Manual therapy, and Re-evaluation  PLAN FOR NEXT SESSION: Check FOTO  Cammie Mcgee, PT, DPT # [phone] Physical Therapist- [facility]  06/04/2022, 8:37 PM

## 2022-06-05 ENCOUNTER — Other Ambulatory Visit: Payer: Medicare HMO

## 2022-06-05 ENCOUNTER — Ambulatory Visit: Payer: Medicare HMO | Admitting: Oncology

## 2022-06-09 ENCOUNTER — Ambulatory Visit: Payer: Medicare HMO | Admitting: Physical Therapy

## 2022-06-11 ENCOUNTER — Ambulatory Visit: Payer: Medicare HMO | Attending: Orthopedic Surgery | Admitting: Physical Therapy

## 2022-06-11 DIAGNOSIS — M25612 Stiffness of left shoulder, not elsewhere classified: Secondary | ICD-10-CM | POA: Insufficient documentation

## 2022-06-11 DIAGNOSIS — M6281 Muscle weakness (generalized): Secondary | ICD-10-CM | POA: Insufficient documentation

## 2022-06-11 DIAGNOSIS — G8929 Other chronic pain: Secondary | ICD-10-CM | POA: Diagnosis present

## 2022-06-11 DIAGNOSIS — Z96612 Presence of left artificial shoulder joint: Secondary | ICD-10-CM | POA: Insufficient documentation

## 2022-06-11 DIAGNOSIS — M25512 Pain in left shoulder: Secondary | ICD-10-CM | POA: Insufficient documentation

## 2022-06-11 NOTE — Therapy (Signed)
OUTPATIENT PHYSICAL THERAPY SHOULDER TREATMENT  Patient Name: Darlene Chen MRN: [id number] DOB:[birth year], 72 y.o., female Today's Date: 06/11/2022  END OF SESSION:  PT End of Session - 06/11/22 1241     Visit Number 11    Number of Visits 16    Date for PT Re-Evaluation 06/30/22    PT Start Time 1255    PT Stop Time 1336    PT Time Calculation (min) 41 min    Activity Tolerance Patient tolerated treatment well    Behavior During Therapy WFL for tasks assessed/performed            Past Medical History:  Diagnosis Date   Diabetes mellitus without complication (HCC)    High cholesterol    Hypertension    Neutropenia (HCC)    Neutropenia (HCC)    Past Surgical History:  Procedure Laterality Date   arthroscopic knee surgery  on left 2000     BREAST BIOPSY Right 2019   bx/clip-neg   LEFT HEART CATH AND CORONARY ANGIOGRAPHY N/A 02/09/2017   Procedure: LEFT HEART CATH AND CORONARY ANGIOGRAPHY;  Surgeon: Alwyn Pea, MD;  Location: [facility] INVASIVE CV LAB;  Service: Cardiovascular;  Laterality: N/A;   REDUCTION MAMMAPLASTY Bilateral    1990   SHOULDER SURGERY Right    TUBAL LIGATION  1985   UMBILICAL HERNIA REPAIR  2007   Patient Active Problem List   Diagnosis Date Noted   Pancreatic cyst 11/21/2018   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HCC) 09/21/2018   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   Leucopenia 07/06/2018   B12 deficiency 07/06/2018   Fatty liver disease, nonalcoholic 12/12/2017   Onychomycosis 07/20/2017   Hypertensive disorder 12/03/2016   Intracranial aneurysm 12/03/2016   Family history of brain aneurysm 08/07/2016   Obstructive sleep apnea 06/17/2016   Hyperglycemia, unspecified 01/29/2016   Morbid obesity with BMI of 45.0-49.9, adult (HCC) 04/08/2015   Myalgia 11/15/2014   Costochondritis 04/11/2014   Osteopenia 04/11/2014   Prediabetes 02/15/2014   Hypercholesterolemia 09/26/2013   Degenerative arthritis of left knee 06/02/2013     PCP: Rayetta Humphrey, MD  REFERRING PROVIDER: Holland Commons, MD  REFERRING DIAG: s/p L reverse total shoulder replacement  THERAPY DIAG:  Status post reverse total shoulder replacement, left  Chronic left shoulder pain  Shoulder joint stiffness, left  Muscle weakness (generalized)  Rationale for Evaluation and Treatment: Rehabilitation  ONSET DATE: 04/14/22  SUBJECTIVE:                                                                                                                                                                                      SUBJECTIVE STATEMENT:  EVALUATION Pt. S/p L reverse  total shoulder replacement on 04/14/22.  Pt. Had previous R total shoulder.  Pt. Reports no L shoulder pain at rest and 7-8/10 pain with movement.  Pt. Currently wearing a sling and slowly weaning out of sling with home activities.  Pt. Is sleeping in recliner due to difficulty getting comfortable at night.   Hand dominance: Right  PERTINENT HISTORY: Pt. Well known to PT clinic.  See previous PT/MD notes.   PAIN:  Are you having pain? Yes: NPRS scale: 7/10 Pain location: L shoulder Pain description: aching/ sharp Aggravating factors: PROM Relieving factors: rest/ ice  PRECAUTIONS: Shoulder  WEIGHT BEARING RESTRICTIONS: No  FALLS:  Has patient fallen in last 6 months? No  LIVING ENVIRONMENT: Lives with: lives with their spouse Lives in: House/apartment Has following equipment at home: Single point cane  OCCUPATION: Retired  PLOF: Independent  PATIENT GOALS:  Increase L shoulder ROM/ strength/ pain-free mobility  NEXT MD VISIT: approx. 6 weeks.  OBJECTIVE:   DIAGNOSTIC FINDINGS:  See imaging  PATIENT SURVEYS:  FOTO initial 42/ goal 35  COGNITION: Overall cognitive status: Within functional limits for tasks assessed     SENSATION: WFL  POSTURE: Rounded shoulders.    UPPER EXTREMITY ROM:   Passive ROM Right eval Left eval  Shoulder flexion 146  deg. 95 deg.  Shoulder extension  NT  Shoulder abduction 125 deg. 80 deg.  Shoulder adduction    Shoulder internal rotation 75 deg. 30 deg.  Shoulder external rotation 75 deg. 0 deg.  Elbow flexion [facility] WFL  Elbow extension [facility] WFL  Wrist flexion    Wrist extension    Wrist ulnar deviation    Wrist radial deviation    Wrist pronation    Wrist supination    (Blank rows = not tested)  UPPER EXTREMITY MMT:   TBD when appropriate.  See MD protocol.  Grip strength: R 52#/ L 33#   SHOULDER SPECIAL TESTS: N/A  JOINT MOBILITY TESTING:  N/A  PALPATION:  Minimal tenderness around incision.  Incision is scabby/ dry and has 3 area of drainage (clear)- see picture.  Pt. Instructed to contact MD if incision worsens.   5/7: Supine L shoulder PROM: flexion 124 deg./ abduction 90 deg.    TODAY'S TREATMENT:                                                                                                                                         DATE: 06/11/2022  Subjective: Pt reports no pain in shoulder currently but 5/10 pain in B knees.  Pt.has completed antibiotics and states incision is looking better.  Pt states she is more active with household chores.  Pt. Is planning to return to driving this weekend.  Pt. Reports limited compliance with HEP over past several days secondary to feeling ill.    There.ex:  No UBE (see MD protocol).    Seated L shoulder AROM:  flexion/ abduction/ extension/ ER/ IR 10x each.  Banker.    Nustep L3 B UE/LE 10 min. (Consistent cadence).    Standing scapular retraction/ horizontal flexion/ B ER: 10x2 with RTB.   3# bicep curls 10x2.    2nd shelf reaching (cones).  10x2.  Good scapular mobility/ cuing to prevent L UT elevation.    Reviewed HEP  PATIENT EDUCATION: Education details: HEP/ incision healing/care Person educated: Patient Education method: Medical illustrator Education comprehension: verbalized understanding and returned  demonstration  HOME EXERCISE PROGRAM: Access Code: [email or web address] URL: [email or web address] Date: 05/21/2022 Prepared by: Dorene Grebe  Exercises - Seated Shoulder Flexion AAROM with Pulley Behind  - 2 x daily - 7 x weekly - 1 sets - 20 reps - Seated Shoulder Scaption AAROM with Pulley at Side  - 2 x daily - 7 x weekly - 1 sets - 20 reps - Standing Shoulder Flexion ROM with Dowel  - 2 x daily - 7 x weekly - 2 sets - 10 reps - Standing Shoulder Abduction AAROM with Dowel  - 2 x daily - 7 x weekly - 2 sets - 10 reps - Standing Isometric Shoulder Flexion with Doorway - Arm Bent  - 2 x daily - 7 x weekly - 1 sets - 10 reps - Standing Isometric Shoulder External Rotation with Doorway and Towel Roll  - 2 x daily - 7 x weekly - 1 sets - 10 reps - Seated Scapular Retraction  - 2 x daily - 7 x weekly - 1 sets - 10 reps  Access Code: [email or web address] URL: [email or web address] Date: 06/04/2022 Prepared by: Dorene Grebe  Exercises - Standing Shoulder Flexion ROM with Dowel  - 2 x daily - 7 x weekly - 2 sets - 10 reps - Standing Shoulder Abduction AAROM with Dowel  - 2 x daily - 7 x weekly - 2 sets - 10 reps - Scapular Retraction with Resistance  - 1 x daily - 7 x weekly - 2 sets - 15 reps - Standing Single Arm Shoulder Flexion with Posterior Anchored Resistance  - 1 x daily - 7 x weekly - 2 sets - 15 reps - Standing Bicep Curls with Resistance  - 1 x daily - 7 x weekly - 2 sets - 15 reps  ASSESSMENT:  CLINICAL IMPRESSION: Pt. Increasing shoulder AROM within MD's protocol. Good technique with light resistance scapular/ shoulder ex.  Pt. Will continue with current RTB resisted there.ex.  Pt encouraged to continue POC to tolerance.   Limited standing ex. With shoulders secondary to knee issues/ pain.  Pt will benefit from skilled PT services to increase L shoulder ROM/ strength as compared to R shoulder to improve pain-free mobility.    OBJECTIVE IMPAIRMENTS: decreased activity  tolerance, decreased endurance, decreased mobility, decreased ROM, decreased strength, hypomobility, increased edema, impaired flexibility, impaired UE functional use, improper body mechanics, postural dysfunction, obesity, and pain.   ACTIVITY LIMITATIONS: carrying, lifting, sleeping, bathing, toileting, dressing, self feeding, reach over head, hygiene/grooming, and caring for others  PARTICIPATION LIMITATIONS: meal prep, cleaning, laundry, driving, shopping, and community activity  PERSONAL FACTORS: Fitness and Past/current experiences are also affecting patient's functional outcome.   REHAB POTENTIAL: Good  CLINICAL DECISION MAKING: Evolving/moderate complexity  EVALUATION COMPLEXITY: Moderate   GOALS: Goals reviewed with patient? Yes  SHORT TERM GOALS: Target date: 06/02/22  Pt. Independent with HEP to increase L shoulder AROM to [facility] as compared to R shoulder to improve overhead reaching/ ADL.   Baseline:  See above Goal  status: Goal met   LONG TERM GOALS: Target date: 06/30/22  Pt. Will increase FOTO to 64 to improve pain-free UE mobility.   Baseline: initial 42.  6/6: 60 (consistent progress) Goal status: INITIAL  2.  Pt. Able to sleep in bed with proper positioning and no increase c/o L shoulder pain.   Baseline: sleeping in recliner Goal status: INITIAL  3.  Pt. Will increase L shoulder strength to grossly 4/5 MMT to improve functional tasks/ carrying.   Baseline: TBD Goal status: INITIAL  4.  Pt. Able to return to household tasks with no L shoulder pain or limitations.   Baseline: see restrictions/ MD protocol.  Goal status: INITIAL   PLAN:  PT FREQUENCY: 2x/week  PT DURATION: 8 weeks  PLANNED INTERVENTIONS: Therapeutic exercises, Therapeutic activity, Neuromuscular re-education, Balance training, Gait training, Patient/Family education, Self Care, Joint mobilization, Electrical stimulation, Cryotherapy, Moist heat, scar mobilization, Manual therapy, and  Re-evaluation  PLAN FOR NEXT SESSION: Reassess goals.    Cammie Mcgee, PT, DPT # [phone] Physical Therapist- [facility]  06/11/2022, 1:37 PM

## 2022-06-16 ENCOUNTER — Ambulatory Visit: Payer: Medicare HMO | Admitting: Physical Therapy

## 2022-06-16 DIAGNOSIS — Z96612 Presence of left artificial shoulder joint: Secondary | ICD-10-CM

## 2022-06-16 DIAGNOSIS — G8929 Other chronic pain: Secondary | ICD-10-CM

## 2022-06-16 DIAGNOSIS — M6281 Muscle weakness (generalized): Secondary | ICD-10-CM

## 2022-06-16 DIAGNOSIS — M25612 Stiffness of left shoulder, not elsewhere classified: Secondary | ICD-10-CM

## 2022-06-16 NOTE — Therapy (Signed)
OUTPATIENT PHYSICAL THERAPY SHOULDER TREATMENT  Patient Name: Darlene Chen MRN: [id number] DOB:11-12-1950, 72 y.o., female Today's Date: 06/17/2022  END OF SESSION:  PT End of Session - 06/16/22 1226     Visit Number 12    Number of Visits 16    Date for PT Re-Evaluation 06/30/22    PT Start Time 1254    PT Stop Time 1341    PT Time Calculation (min) 47 min    Activity Tolerance Patient tolerated treatment well    Behavior During Therapy WFL for tasks assessed/performed            Past Medical History:  Diagnosis Date   Diabetes mellitus without complication (HCC)    High cholesterol    Hypertension    Neutropenia (HCC)    Neutropenia (HCC)    Past Surgical History:  Procedure Laterality Date   arthroscopic knee surgery  on left 2000     BREAST BIOPSY Right 2019   bx/clip-neg   LEFT HEART CATH AND CORONARY ANGIOGRAPHY N/A 02/09/2017   Procedure: LEFT HEART CATH AND CORONARY ANGIOGRAPHY;  Surgeon: Alwyn Pea, MD;  Location: [facility] INVASIVE CV LAB;  Service: Cardiovascular;  Laterality: N/A;   REDUCTION MAMMAPLASTY Bilateral    1990   SHOULDER SURGERY Right    TUBAL LIGATION  1985   UMBILICAL HERNIA REPAIR  2007   Patient Active Problem List   Diagnosis Date Noted   Pancreatic cyst 11/21/2018   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HCC) 09/21/2018   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   Leucopenia 07/06/2018   B12 deficiency 07/06/2018   Fatty liver disease, nonalcoholic 12/12/2017   Onychomycosis 07/20/2017   Hypertensive disorder 12/03/2016   Intracranial aneurysm 12/03/2016   Family history of brain aneurysm 08/07/2016   Obstructive sleep apnea 06/17/2016   Hyperglycemia, unspecified 01/29/2016   Morbid obesity with BMI of 45.0-49.9, adult (HCC) 04/08/2015   Myalgia 11/15/2014   Costochondritis 04/11/2014   Osteopenia 04/11/2014   Prediabetes 02/15/2014   Hypercholesterolemia 09/26/2013   Degenerative arthritis of left knee 06/02/2013     PCP: Rayetta Humphrey, MD  REFERRING PROVIDER: Holland Commons, MD  REFERRING DIAG: s/p L reverse total shoulder replacement  THERAPY DIAG:  Status post reverse total shoulder replacement, left  Chronic left shoulder pain  Shoulder joint stiffness, left  Muscle weakness (generalized)  Rationale for Evaluation and Treatment: Rehabilitation  ONSET DATE: 04/14/22  SUBJECTIVE:                                                                                                                                                                                      SUBJECTIVE STATEMENT:  EVALUATION Pt. S/p L reverse  total shoulder replacement on 04/14/22.  Pt. Had previous R total shoulder.  Pt. Reports no L shoulder pain at rest and 7-8/10 pain with movement.  Pt. Currently wearing a sling and slowly weaning out of sling with home activities.  Pt. Is sleeping in recliner due to difficulty getting comfortable at night.   Hand dominance: Right  PERTINENT HISTORY: Pt. Well known to PT clinic.  See previous PT/MD notes.   PAIN:  Are you having pain? Yes: NPRS scale: 7/10 Pain location: L shoulder Pain description: aching/ sharp Aggravating factors: PROM Relieving factors: rest/ ice  PRECAUTIONS: Shoulder  WEIGHT BEARING RESTRICTIONS: No  FALLS:  Has patient fallen in last 6 months? No  LIVING ENVIRONMENT: Lives with: lives with their spouse Lives in: House/apartment Has following equipment at home: Single point cane  OCCUPATION: Retired  PLOF: Independent  PATIENT GOALS:  Increase L shoulder ROM/ strength/ pain-free mobility  NEXT MD VISIT: approx. 6 weeks.  OBJECTIVE:   DIAGNOSTIC FINDINGS:  See imaging  PATIENT SURVEYS:  FOTO initial 42/ goal 80  COGNITION: Overall cognitive status: Within functional limits for tasks assessed     SENSATION: WFL  POSTURE: Rounded shoulders.    UPPER EXTREMITY ROM:   Passive ROM Right eval Left eval  Shoulder flexion 146  deg. 95 deg.  Shoulder extension  NT  Shoulder abduction 125 deg. 80 deg.  Shoulder adduction    Shoulder internal rotation 75 deg. 30 deg.  Shoulder external rotation 75 deg. 0 deg.  Elbow flexion [facility] WFL  Elbow extension [facility] WFL  Wrist flexion    Wrist extension    Wrist ulnar deviation    Wrist radial deviation    Wrist pronation    Wrist supination    (Blank rows = not tested)  UPPER EXTREMITY MMT:   TBD when appropriate.  See MD protocol.  Grip strength: R 52#/ L 33#   SHOULDER SPECIAL TESTS: N/A  JOINT MOBILITY TESTING:  N/A  PALPATION:  Minimal tenderness around incision.  Incision is scabby/ dry and has 3 area of drainage (clear)- see picture.  Pt. Instructed to contact MD if incision worsens.   5/7: Supine L shoulder PROM: flexion 124 deg./ abduction 90 deg.    TODAY'S TREATMENT:                                                                                                                                         DATE: 06/17/2022  Subjective: Pt reports no pain in shoulder currently but 5/10 pain in B knees.  Pt. Has returned to driving with no issues.  Pt. Has returned to sleeping in bed with no issues.    There.ex:  No UBE (see MD protocol).    Nustep L4 B UE/LE 10 min. (Consistent cadence).   Standing wall ladder: R/L UE equal heights (no limitations).    Seated L shoulder AROM: flexion (147 deg.)/  abduction (98 deg.)/ extension (58 deg.)/ ER/ IR 10x each.  Banker.     Reviewed HEP.  Supine 3# ex.: press-ups/ bicep curls with elbow extension 10x2.  No wt. For horizontal abduction/ adduction 10x2.  Supine serratus punches 20x.    Seated scapular retraction/ assessment of B scapular upward rotation, retraction, protraction in seated posture.     Reviewed HEP  PATIENT EDUCATION: Education details: HEP/ incision healing/care Person educated: Patient Education method: Medical illustrator Education comprehension: verbalized  understanding and returned demonstration  HOME EXERCISE PROGRAM: Access Code: [email or web address] URL: [email or web address] Date: 05/21/2022 Prepared by: Dorene Grebe  Exercises - Seated Shoulder Flexion AAROM with Pulley Behind  - 2 x daily - 7 x weekly - 1 sets - 20 reps - Seated Shoulder Scaption AAROM with Pulley at Side  - 2 x daily - 7 x weekly - 1 sets - 20 reps - Standing Shoulder Flexion ROM with Dowel  - 2 x daily - 7 x weekly - 2 sets - 10 reps - Standing Shoulder Abduction AAROM with Dowel  - 2 x daily - 7 x weekly - 2 sets - 10 reps - Standing Isometric Shoulder Flexion with Doorway - Arm Bent  - 2 x daily - 7 x weekly - 1 sets - 10 reps - Standing Isometric Shoulder External Rotation with Doorway and Towel Roll  - 2 x daily - 7 x weekly - 1 sets - 10 reps - Seated Scapular Retraction  - 2 x daily - 7 x weekly - 1 sets - 10 reps  Access Code: [email or web address] URL: [email or web address] Date: 06/04/2022 Prepared by: Dorene Grebe  Exercises - Standing Shoulder Flexion ROM with Dowel  - 2 x daily - 7 x weekly - 2 sets - 10 reps - Standing Shoulder Abduction AAROM with Dowel  - 2 x daily - 7 x weekly - 2 sets - 10 reps - Scapular Retraction with Resistance  - 1 x daily - 7 x weekly - 2 sets - 15 reps - Standing Single Arm Shoulder Flexion with Posterior Anchored Resistance  - 1 x daily - 7 x weekly - 2 sets - 15 reps - Standing Bicep Curls with Resistance  - 1 x daily - 7 x weekly - 2 sets - 15 reps  ASSESSMENT:  CLINICAL IMPRESSION: Pt. Demonstrates marked increase in shoulder ROM to [facility] for flexion/ reaching.  Good technique with light resistance scapular/ shoulder ex.  Pt. Will continue with current RTB resisted there.ex.  Pt encouraged to continue POC to tolerance.   Limited standing ex. With shoulders secondary to knee issues/ pain.  Pt. Will focus on HEP over next 2 weeks and return to PT for goal reassessment.  Probable discharge next visit if no issues.   Pt. Is ready to focus on LE and upcoming TKA this year.    OBJECTIVE IMPAIRMENTS: decreased activity tolerance, decreased endurance, decreased mobility, decreased ROM, decreased strength, hypomobility, increased edema, impaired flexibility, impaired UE functional use, improper body mechanics, postural dysfunction, obesity, and pain.   ACTIVITY LIMITATIONS: carrying, lifting, sleeping, bathing, toileting, dressing, self feeding, reach over head, hygiene/grooming, and caring for others  PARTICIPATION LIMITATIONS: meal prep, cleaning, laundry, driving, shopping, and community activity  PERSONAL FACTORS: Fitness and Past/current experiences are also affecting patient's functional outcome.   REHAB POTENTIAL: Good  CLINICAL DECISION MAKING: Evolving/moderate complexity  EVALUATION COMPLEXITY: Moderate   GOALS: Goals reviewed with patient? Yes  SHORT TERM GOALS: Target date: 06/02/22  Pt. Independent with HEP to  increase L shoulder AROM to [facility] as compared to R shoulder to improve overhead reaching/ ADL.   Baseline:  See above Goal status: Goal met   LONG TERM GOALS: Target date: 06/30/22  Pt. Will increase FOTO to 64 to improve pain-free UE mobility.   Baseline: initial 42.  6/6: 60 (consistent progress) Goal status: Partially met  2.  Pt. Able to sleep in bed with proper positioning and no increase c/o L shoulder pain.   Baseline: sleeping in recliner.  6/11: no issues sleeping in bed Goal status: Goal met  3.  Pt. Will increase L shoulder strength to grossly 4/5 MMT to improve functional tasks/ carrying.   Baseline: TBD.  L bicep 4+/5, flexion 3/5, abduction 3/5 MMT.   Goal status: Partially met  4.  Pt. Able to return to household tasks with no L shoulder pain or limitations.   Baseline: see restrictions/ MD protocol.  Goal status: Partially met   PLAN:  PT FREQUENCY: 2x/week  PT DURATION: 8 weeks  PLANNED INTERVENTIONS: Therapeutic exercises, Therapeutic activity,  Neuromuscular re-education, Balance training, Gait training, Patient/Family education, Self Care, Joint mobilization, Electrical stimulation, Cryotherapy, Moist heat, scar mobilization, Manual therapy, and Re-evaluation  PLAN FOR NEXT SESSION: Reassess goals/ Probable discharge.    Cammie Mcgee, PT, DPT # [phone] Physical Therapist- [facility]  06/17/2022, 6:48 PM

## 2022-06-18 ENCOUNTER — Ambulatory Visit: Payer: Medicare HMO | Admitting: Physical Therapy

## 2022-06-23 ENCOUNTER — Encounter: Payer: Medicare HMO | Admitting: Physical Therapy

## 2022-06-23 ENCOUNTER — Ambulatory Visit
Admission: RE | Admit: 2022-06-23 | Discharge: 2022-06-23 | Disposition: A | Discharge status: Home / Self Care | Payer: Medicare HMO | Source: Ambulatory Visit | Attending: Oncology | Admitting: Oncology

## 2022-06-23 DIAGNOSIS — D709 Neutropenia, unspecified: Secondary | ICD-10-CM | POA: Insufficient documentation

## 2022-06-23 MED ORDER — GADOBUTROL 1 MMOL/ML IV SOLN
10.0000 mL | Freq: Once | INTRAVENOUS | Status: AC | PRN
  Administered 2022-06-23: 10 mL via INTRAVENOUS

## 2022-06-25 ENCOUNTER — Encounter: Payer: Medicare HMO | Admitting: Physical Therapy

## 2022-06-30 ENCOUNTER — Ambulatory Visit: Payer: Medicare HMO | Admitting: Physical Therapy

## 2022-07-02 ENCOUNTER — Encounter: Payer: Medicare HMO | Admitting: Physical Therapy

## 2022-07-07 ENCOUNTER — Encounter: Payer: Medicare HMO | Admitting: Physical Therapy

## 2022-07-10 ENCOUNTER — Other Ambulatory Visit: Payer: Self-pay | Admitting: Family Medicine

## 2022-07-10 DIAGNOSIS — Z1231 Encounter for screening mammogram for malignant neoplasm of breast: Secondary | ICD-10-CM

## 2022-07-13 ENCOUNTER — Other Ambulatory Visit: Payer: Self-pay | Admitting: *Deleted

## 2022-07-13 ENCOUNTER — Inpatient Hospital Stay: Payer: Medicare HMO | Attending: Oncology | Admitting: Oncology

## 2022-07-13 ENCOUNTER — Encounter: Payer: Self-pay | Admitting: Oncology

## 2022-07-13 ENCOUNTER — Inpatient Hospital Stay: Payer: Medicare HMO

## 2022-07-13 ENCOUNTER — Ambulatory Visit
Admission: RE | Admit: 2022-07-13 | Discharge: 2022-07-13 | Disposition: A | Discharge status: Home / Self Care | Payer: Medicare HMO | Source: Ambulatory Visit | Attending: Family Medicine | Admitting: Family Medicine

## 2022-07-13 VITALS — BP 133/80 | HR 61 | Temp 97.0°F | Ht 60.0 in | Wt 217.7 lb

## 2022-07-13 DIAGNOSIS — Z1231 Encounter for screening mammogram for malignant neoplasm of breast: Secondary | ICD-10-CM | POA: Diagnosis present

## 2022-07-13 DIAGNOSIS — D708 Other neutropenia: Secondary | ICD-10-CM | POA: Insufficient documentation

## 2022-07-13 DIAGNOSIS — Z803 Family history of malignant neoplasm of breast: Secondary | ICD-10-CM | POA: Insufficient documentation

## 2022-07-13 DIAGNOSIS — K862 Cyst of pancreas: Secondary | ICD-10-CM | POA: Diagnosis not present

## 2022-07-13 DIAGNOSIS — D709 Neutropenia, unspecified: Secondary | ICD-10-CM

## 2022-07-13 DIAGNOSIS — K7689 Other specified diseases of liver: Secondary | ICD-10-CM | POA: Diagnosis not present

## 2022-07-13 LAB — CBC WITH DIFFERENTIAL/PLATELET
Abs Immature Granulocytes: 0.02 10*3/uL (ref 0.00–0.07)
Basophils Absolute: 0 10*3/uL (ref 0.0–0.1)
Basophils Relative: 0 %
Eosinophils Absolute: 0 10*3/uL (ref 0.0–0.5)
Eosinophils Relative: 0 %
HCT: 38.8 % (ref 36.0–46.0)
Hemoglobin: 12.6 g/dL (ref 12.0–15.0)
Immature Granulocytes: 1 %
Lymphocytes Relative: 49 %
Lymphs Abs: 1.4 10*3/uL (ref 0.7–4.0)
MCH: 31.3 pg (ref 26.0–34.0)
MCHC: 32.5 g/dL (ref 30.0–36.0)
MCV: 96.5 fL (ref 80.0–100.0)
Monocytes Absolute: 0.4 10*3/uL (ref 0.1–1.0)
Monocytes Relative: 15 %
Neutro Abs: 1 10*3/uL — ABNORMAL LOW (ref 1.7–7.7)
Neutrophils Relative %: 35 %
Platelets: 312 10*3/uL (ref 150–400)
RBC: 4.02 MIL/uL (ref 3.87–5.11)
RDW: 14.9 % (ref 11.5–15.5)
WBC: 2.8 10*3/uL — ABNORMAL LOW (ref 4.0–10.5)
nRBC: 0 % (ref 0.0–0.2)

## 2022-07-13 NOTE — Progress Notes (Signed)
MRI results

## 2022-07-13 NOTE — Progress Notes (Signed)
Hematology/Oncology Consult note [facility]  Telephone:([phone] [phone]  Patient Care Team: Rayetta Humphrey, MD as PCP - General (Family Medicine) Creig Hines, MD as Consulting Physician (Oncology)   Name of the patient: Darlene Chen  [id number]  [birth year]   Date of visit: 07/13/22  Diagnosis-  1.  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  Pancreatic cyst  Chief complaint/ Reason for visit-routine follow-up of neutropenia and pancreatic cyst  Heme/Onc history:  Patient is a 73 year old female who was referred for neutropenia.  ANC fluctuates between 1.6-1.9 since 2018.  B12 folate HIV hep C normal.   Patient had MRI abdomen done in the past in 2019 and was found to have a 1.7 cm cyst in the pancreatic bodyThis has been subsequently followed and has remained stable without requiring any invasive intervention.  MRI also shows kidney and liver cysts  Interval history-patient is doing well and denies any specific complaints at this time.  No recent hospitalizations or infections.  ECOG PS- 0 Pain scale- 0   Review of systems- Review of Systems  Constitutional:  Negative for chills, fever, malaise/fatigue and weight loss.  HENT:  Negative for congestion, ear discharge and nosebleeds.   Eyes:  Negative for blurred vision.  Respiratory:  Negative for cough, hemoptysis, sputum production, shortness of breath and wheezing.   Cardiovascular:  Negative for chest pain, palpitations, orthopnea and claudication.  Gastrointestinal:  Negative for abdominal pain, blood in stool, constipation, diarrhea, heartburn, melena, nausea and vomiting.  Genitourinary:  Negative for dysuria, flank pain, frequency, hematuria and urgency.  Musculoskeletal:  Negative for back pain, joint pain and myalgias.  Skin:  Negative for rash.  Neurological:  Negative for dizziness, tingling, focal weakness, seizures, weakness and headaches.   Endo/Heme/Allergies:  Does not bruise/bleed easily.  Psychiatric/Behavioral:  Negative for depression and suicidal ideas. The patient does not have insomnia.       Allergies  Allergen Reactions   Colesevelam Nausea Only   Biaxin [Clarithromycin] Hives   Niacin Other (See Comments)    Burning from inside    Statins Other (See Comments)    Muscle/joint pain.   Sulfa Antibiotics Hives   Sulfur Dioxide    Tramadol Other (See Comments)    Unsure of exact reaction type   Ezetimibe Other (See Comments)    Muscle/joint pain     Past Medical History:  Diagnosis Date   Diabetes mellitus without complication (HCC)    High cholesterol    Hypertension    Neutropenia (HCC)    Neutropenia (HCC)      Past Surgical History:  Procedure Laterality Date   arthroscopic knee surgery  on left 2000     BREAST BIOPSY Right 2019   bx/clip-neg   LEFT HEART CATH AND CORONARY ANGIOGRAPHY N/A 02/09/2017   Procedure: LEFT HEART CATH AND CORONARY ANGIOGRAPHY;  Surgeon: Alwyn Pea, MD;  Location: [facility] INVASIVE CV LAB;  Service: Cardiovascular;  Laterality: N/A;   REDUCTION MAMMAPLASTY Bilateral    1990   SHOULDER SURGERY Right    TOTAL SHOULDER REPLACEMENT Left 2024   TUBAL LIGATION  1985   UMBILICAL HERNIA REPAIR  2007    Social History   Socioeconomic History   Marital status: Married    Spouse name: Not on file   Number of children: Not on file   Years of education: Not on file   Highest education level: Not on file  Occupational History   Not on file  Tobacco Use   Smoking status: Never   Smokeless tobacco: Never  Vaping Use   Vaping Use: Never used  Substance and Sexual Activity   Alcohol use: No   Drug use: No   Sexual activity: Yes  Other Topics Concern   Not on file  Social History Narrative   Not on file   Social Determinants of Health   Financial Resource Strain: Not on file  Food Insecurity: Not on file  Transportation Needs: Not on file  Physical  Activity: Not on file  Stress: Not on file  Social Connections: Not on file  Intimate Partner Violence: Not on file    Family History  Problem Relation Age of Onset   Breast cancer Sister 71     Current Outpatient Medications:    acetaminophen (TYLENOL) 500 MG tablet, Take 1,500 mg by mouth every 6 (six) hours as needed., Disp: , Rfl:    amLODipine (NORVASC) 10 MG tablet, Take 10 mg by mouth daily., Disp: , Rfl:    ammonium lactate (LAC-HYDRIN) 12 % lotion, Apply 1 application. topically as needed., Disp: , Rfl:    cycloSPORINE (RESTASIS) 0.05 % ophthalmic emulsion, Place 1 drop into both eyes 2 (two) times daily as needed. , Disp: , Rfl:    diclofenac sodium (VOLTAREN) 1 % GEL, Apply 1 application topically as needed., Disp: , Rfl:    Evolocumab with Infusor (REPATHA PUSHTRONEX SYSTEM) 420 MG/3.5ML SOCT, Inject 1 Dose into the skin every 30 (thirty) days., Disp: , Rfl:    fluocinonide cream (LIDEX) 0.05 %, Apply 1 application topically 2 (two) times daily as needed. For eczema, Disp: , Rfl: 0   hydrochlorothiazide (HYDRODIURIL) 12.5 MG tablet, Take 12.5 mg by mouth daily., Disp: , Rfl:    metFORMIN (GLUCOPHAGE) 500 MG tablet, Take 500 mg by mouth 2 (two) times daily., Disp: , Rfl:    metoprolol succinate (TOPROL-XL) 25 MG 24 hr tablet, Take 25 mg by mouth daily., Disp: , Rfl:    tiZANidine (ZANAFLEX) 4 MG tablet, Take 4 mg by mouth 3 (three) times daily as needed., Disp: , Rfl:    LORazepam (ATIVAN) 0.5 MG tablet, Take 1 tab 15-20 minutes prior to procedure. (Patient not taking: Reported on 06/04/2021), Disp: , Rfl:   Physical exam:  Vitals:   07/13/22 1103  BP: 133/80  Pulse: 61  Temp: (!) 97 F (36.1 C)  TempSrc: Tympanic  SpO2: 98%  Weight: 217 lb 11.2 oz (98.7 kg)  Height: 5' (1.524 m)   Physical Exam Cardiovascular:     Rate and Rhythm: Normal rate and regular rhythm.     Heart sounds: Normal heart sounds.  Pulmonary:     Effort: Pulmonary effort is normal.  Skin:     General: Skin is warm and dry.  Neurological:     Mental Status: She is alert and oriented to person, place, and time.         Latest Ref Rng & Units 06/04/2021    9:53 AM  CMP  Glucose 70 - 99 mg/dL 161   BUN 8 - 23 mg/dL 21   Creatinine 0.96 - 1.00 mg/dL 0.45   Sodium 409 - 811 mmol/L 140   Potassium 3.5 - 5.1 mmol/L 3.9   Chloride 98 - 111 mmol/L 106   CO2 22 - 32 mmol/L 27   Calcium 8.9 - 10.3 mg/dL 9.0   Total Protein 6.5 - 8.1 g/dL 7.7   Total Bilirubin 0.3 - 1.2 mg/dL 0.5   Alkaline  Phos 38 - 126 U/L 55   AST 15 - 41 U/L 23   ALT 0 - 44 U/L 17       Latest Ref Rng & Units 07/13/2022   10:57 AM  CBC  WBC 4.0 - 10.5 K/uL 2.8   Hemoglobin 12.0 - 15.0 g/dL 16.1   Hematocrit 09.6 - 46.0 % 38.8   Platelets 150 - 400 K/uL 312     No images are attached to the encounter.  MR Abdomen W Wo Contrast  Result Date: 06/27/2022 CLINICAL DATA:  Follow-up pancreatic cystic lesion EXAM: MRI ABDOMEN WITHOUT AND WITH CONTRAST TECHNIQUE: Multiplanar multisequence MR imaging of the abdomen was performed both before and after the administration of intravenous contrast. CONTRAST:  10mL GADAVIST GADOBUTROL 1 MMOL/ML IV SOLN COMPARISON:  05/28/2021, 05/23/2020, 11/30/2017 FINDINGS: Lower chest: Cardiomegaly Hepatobiliary: No solid liver abnormality is seen. Simple, benign liver cysts, for which no further follow-up or characterization is required. No gallstones, gallbladder wall thickening, or biliary dilatation. Pancreas: Unchanged, septated fluid signal cystic lesion of the proximal pancreatic body measuring 1.8 x 1.6 cm (series 6, image 19). No solid component or suspicious contrast enhancement. No pancreatic ductal dilatation or surrounding inflammatory changes. Spleen: Normal in size without significant abnormality. Adrenals/Urinary Tract: Adrenal glands are unremarkable. Simple, benign right renal cortical cysts, for which no further follow-up or characterization required. Kidneys are otherwise  normal, without obvious renal calculi, solid lesion, or hydronephrosis. Stomach/Bowel: Stomach is within normal limits. No evidence of bowel wall thickening, distention, or inflammatory changes. Pancolonic diverticulosis. Vascular/Lymphatic: No significant vascular findings are present. No enlarged abdominal lymph nodes. Other: No abdominal wall hernia or abnormality. No ascites. Musculoskeletal: No acute or significant osseous findings. IMPRESSION: 1. Unchanged, septated fluid signal cystic lesion of the proximal pancreatic body measuring 1.8 x 1.6 cm. No solid component or suspicious contrast enhancement. No pancreatic ductal dilatation or surrounding inflammatory changes. This is consistent with a side branch IPMN or pseudocyst and is unchanged in comparison to examinations dating back to 2019, benign. No further follow-up or characterization is required. 2. Pancolonic diverticulosis. 3. Cardiomegaly. Electronically Signed   By: Jearld Lesch M.D.   On: 06/27/2022 15:12     Assessment and plan- Patient is a 72 y.o. female who is here for follow-up of following issues:  Chronic neutropenia: This is benign and has remained stable over the last 5 years at least.  White cell count fluctuates between 2.5-3.5 and ANC has remained more than 1.  No associated anemia or thrombocytopenia.  This is likely benign and can be monitored by her primary care doctor.  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  She also has renal cortical cysts and liver cysts which have all remained unchanged.  This does not require any further follow-up per radiology.  Overall patient does not require any appointments or follow-up with oncology at this time and she can be referred to [place] in the future if questions or concerns arise   Visit Diagnosis 1. Chronic benign neutropenia (HCC)   2. Pancreatic cyst      Dr. Owens Shark, MD, MPH [facility]  at [facility] [phone] 07/13/2022 3:00 PM

## 2022-07-14 ENCOUNTER — Encounter: Payer: Medicare HMO | Admitting: Physical Therapy

## 2022-07-16 ENCOUNTER — Encounter: Payer: Medicare HMO | Admitting: Physical Therapy

## 2022-07-20 ENCOUNTER — Ambulatory Visit: Payer: Medicare HMO | Admitting: Physical Therapy

## 2022-07-30 ENCOUNTER — Ambulatory Visit: Payer: Medicare HMO | Admitting: Physical Therapy

## 2023-03-10 ENCOUNTER — Other Ambulatory Visit: Payer: Self-pay | Admitting: Family Medicine

## 2023-03-10 DIAGNOSIS — Z1231 Encounter for screening mammogram for malignant neoplasm of breast: Secondary | ICD-10-CM

## 2023-07-14 ENCOUNTER — Ambulatory Visit
Admission: RE | Admit: 2023-07-14 | Discharge: 2023-07-14 | Disposition: A | Discharge status: Home / Self Care | Source: Ambulatory Visit | Attending: Family Medicine | Admitting: Family Medicine

## 2023-07-14 DIAGNOSIS — Z1231 Encounter for screening mammogram for malignant neoplasm of breast: Secondary | ICD-10-CM | POA: Insufficient documentation

## 2023-10-19 ENCOUNTER — Ambulatory Visit: Payer: Medicare HMO | Admitting: Dermatology

## 2023-10-19 ENCOUNTER — Encounter: Payer: Self-pay | Admitting: Dermatology

## 2023-10-19 DIAGNOSIS — L821 Other seborrheic keratosis: Secondary | ICD-10-CM | POA: Diagnosis not present

## 2023-10-19 DIAGNOSIS — L249 Irritant contact dermatitis, unspecified cause: Secondary | ICD-10-CM

## 2023-10-19 DIAGNOSIS — Z79899 Other long term (current) drug therapy: Secondary | ICD-10-CM

## 2023-10-19 DIAGNOSIS — L649 Androgenic alopecia, unspecified: Secondary | ICD-10-CM

## 2023-10-19 NOTE — Patient Instructions (Addendum)
 VISIT SUMMARY:  Today, you were seen for concerns about your skin and hair loss. We discussed your combination skin, hair loss around the edges, and new pigmented growths on your face. You were provided with recommendations for skin care products, a new prescription for hair regrowth, and information about cosmetic removal of the pigmented growths.  YOUR PLAN:  -COMBINATION SKIN WITH SEASONAL DRYNESS AND IRRITANT DERMATITIS: You have combination skin that can be both oily and dry, and you experience occasional skin irritation. This irritation may be due to environmental factors or products you use. Continue using your Olay wash.  We provided samples of Avene moisturizers: Tolerance for morning use and Sycophate for night use. If you experience skin irritation again, please schedule an appointment.  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 You can stop using the betamethasone lotion.  We prescribed a compounded minoxidil and finasteride topical solution from [facility]. Apply this solution in the morning to avoid unwanted hair growth on your face. We will follow up in six months to check on your hair regrowth.  -FACIAL DERMATOSIS PAPULOSIS NIGRA: You have benign pigmented growths on your face, which are not harmful. If you wish to remove them for cosmetic reasons, the cost starts at $200 for up to 15 lesions, with an additional $10 for each extra lesion. You will need to schedule a specific appointment for removal and provide a $100 deposit.  INSTRUCTIONS:  Please schedule an appointment if you experience skin irritation again. Apply the prescribed hair regrowth solution in the morning. Schedule a follow-up appointment in six months to assess hair regrowth. If you decide to remove the pigmented growths, schedule a  specific appointment and provide a $100 deposit. Important Information  Due to recent changes in healthcare laws, you may see results of your pathology and/or laboratory studies on MyChart before the doctors have had a chance to review them. We understand that in some cases there may be results that are confusing or concerning to you. Please understand that not all results are received at the same time and often the doctors may need to interpret multiple results in order to provide you with the best plan of care or course of treatment. Therefore, we ask that you please give us  2 business days to thoroughly review all your results before contacting the office for clarification. Should we see a critical lab result, you will be contacted sooner.   If You Need Anything After Your Visit  If you have any questions or concerns for your doctor, please call our main line at [phone] If no one answers, please leave a voicemail as directed and we will return your call as soon as possible. Messages left after 4 pm will be answered the following business day.   You may also send us  a message via MyChart. We typically respond to MyChart messages within 1-2 business days.  For prescription refills, please ask your pharmacy to contact our office. Our fax number is [phone].  If you have an urgent issue when the clinic is closed that cannot wait until the next business day, you can page your doctor at the number below.    Please note that while we do our best to be available for urgent issues outside of office hours, we are not available 24/7.   If you have an urgent  issue and are unable to reach us , you may choose to seek medical care at your doctor's office, retail clinic, urgent care center, or emergency room.  If you have a medical emergency, please immediately call 911 or go to the emergency department. In the event of inclement weather, please call our main line at [phone] for an update on the  status of any delays or closures.  Dermatology Medication Tips: Please keep the boxes that topical medications come in in order to help keep track of the instructions about where and how to use these. Pharmacies typically print the medication instructions only on the boxes and not directly on the medication tubes.   If your medication is too expensive, please contact our office at [phone] or send us  a message through MyChart.   We are unable to tell what your co-pay for medications will be in advance as this is different depending on your insurance coverage. However, we may be able to find a substitute medication at lower cost or fill out paperwork to get insurance to cover a needed medication.   If a prior authorization is required to get your medication covered by your insurance company, please allow us  1-2 business days to complete this process.  Drug prices often vary depending on where the prescription is filled and some pharmacies may offer cheaper prices.  The website www.goodrx.com contains coupons for medications through different pharmacies. The prices here do not account for what the cost may be with help from insurance (it may be cheaper with your insurance), but the website can give you the price if you did not use any insurance.  - You can print the associated coupon and take it with your prescription to the pharmacy.  - You may also stop by our office during regular business hours and pick up a GoodRx coupon card.  - If you need your prescription sent electronically to a different pharmacy, notify our office through [facility] or by phone at [phone]

## 2023-10-19 NOTE — Progress Notes (Signed)
   New Patient Visit   Subjective  Darlene Chen is a 73 y.o. female who presents for the following: Skin concerns  Ameilia is noticing that her skin care products are peeling after she applies them. She uses [employer] and [employer]. She is noticing dry patches and would like to know what she can do different and possibly a new regimen.  The patient has spots, moles and lesions to be evaluated, some may be new or changing and the patient may have concern these could be cancer.    The following portions of the chart were reviewed this encounter and updated as appropriate: medications, allergies, medical history  Review of Systems:  No other skin or systemic complaints except as noted in HPI or Assessment and Plan.  Objective  Well appearing patient in no apparent distress; mood and affect are within normal limits.  A focused examination was performed of the following areas: Face   Relevant physical exam findings are noted in the Assessment and Plan.    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 Intermittent irritant dermatitis likely due to environmental or product-related factors. Exact trigger unclear without active flare for evaluation. - Continue using Olay wash. - Provide samples of Avene moisturizers: Tolerance for morning use and Cicalfate for night use. - Advise to schedule an appointment if irritant dermatitis occurs.  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 Betamethasone can be discontinued as hair regrowth observed, indicating traction alopecia.  - Discontinue betamethasone lotion. - Prescribe compounded minoxidil and finasteride topical solution from [facility]. -  Advise application of the solution in the morning to avoid unwanted hair growth on the face. - Schedule follow-up in six months to assess hair regrowth.  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 Removal is cosmetic and not covered by insurance.  - Discuss cosmetic removal options, starting at $200 for up to 15 lesions, with $10 for each additional lesion. - Advise scheduling a specific appointment for removal, requiring a $100 deposit.   Long term medication management.  Patient is using long term (months to years) prescription medication  to control their dermatologic condition.  These medications require periodic monitoring to evaluate for efficacy and side effects and may require periodic laboratory monitoring.       Return in 6 months (on 04/18/2024) for hair loss.  I, Gordan Beams, CMA, am acting as scribe for [employer], DO.   Documentation: I have reviewed the above documentation for accuracy and completeness, and I agree with the above.  Delon Lenis, DO

## 2023-11-02 ENCOUNTER — Other Ambulatory Visit: Payer: Self-pay

## 2023-11-02 DIAGNOSIS — L649 Androgenic alopecia, unspecified: Secondary | ICD-10-CM

## 2023-11-02 MED ORDER — SAFETY SEAL MISCELLANEOUS MISC: 1.0000 | Freq: Every morning | 6 refills | Status: AC

## 2024-01-01 ENCOUNTER — Ambulatory Visit: Admission: EM | Admit: 2024-01-01 | Discharge: 2024-01-01 | Discharge status: Against Medical Advice

## 2024-01-01 DIAGNOSIS — R0602 Shortness of breath: Secondary | ICD-10-CM

## 2024-01-03 ENCOUNTER — Ambulatory Visit: Payer: Self-pay

## 2024-04-26 ENCOUNTER — Ambulatory Visit: Admitting: Dermatology
